# Patient Record
Sex: Female | Born: 1935 | Race: White | Hispanic: No | Smoking: Never smoker
Health system: Southern US, Community
[De-identification: ages and names within clinical notes are randomized; demographics above are authoritative.]

## PROBLEM LIST (undated history)

## (undated) DIAGNOSIS — I10 Essential (primary) hypertension: Secondary | ICD-10-CM

## (undated) DIAGNOSIS — R55 Syncope and collapse: Secondary | ICD-10-CM

## (undated) DIAGNOSIS — K440 Diaphragmatic hernia with obstruction, without gangrene: Secondary | ICD-10-CM

## (undated) DIAGNOSIS — F419 Anxiety disorder, unspecified: Secondary | ICD-10-CM

## (undated) DIAGNOSIS — M797 Fibromyalgia: Secondary | ICD-10-CM

## (undated) DIAGNOSIS — R42 Dizziness and giddiness: Secondary | ICD-10-CM

## (undated) DIAGNOSIS — M1990 Unspecified osteoarthritis, unspecified site: Secondary | ICD-10-CM

## (undated) DIAGNOSIS — H8109 Meniere's disease, unspecified ear: Secondary | ICD-10-CM

## (undated) DIAGNOSIS — J690 Pneumonitis due to inhalation of food and vomit: Secondary | ICD-10-CM

## (undated) DIAGNOSIS — K219 Gastro-esophageal reflux disease without esophagitis: Secondary | ICD-10-CM

## (undated) DIAGNOSIS — G4700 Insomnia, unspecified: Secondary | ICD-10-CM

## (undated) HISTORY — PX: APPENDECTOMY: SHX54

## (undated) HISTORY — PX: ABDOMINAL HYSTERECTOMY: SHX81

---

## 1979-08-29 HISTORY — PX: BILATERAL TOTAL MASTECTOMY WITH AXILLARY LYMPH NODE DISSECTION: SHX6364

## 1987-08-29 DIAGNOSIS — H8109 Meniere's disease, unspecified ear: Secondary | ICD-10-CM

## 1987-08-29 HISTORY — DX: Meniere's disease, unspecified ear: H81.09

## 2000-12-28 ENCOUNTER — Encounter: Payer: Self-pay | Admitting: Emergency Medicine

## 2000-12-28 ENCOUNTER — Emergency Department (HOSPITAL_COMMUNITY): Admission: EM | Admit: 2000-12-28 | Discharge: 2000-12-28 | Payer: Self-pay | Admitting: Emergency Medicine

## 2001-06-30 ENCOUNTER — Encounter: Payer: Self-pay | Admitting: Emergency Medicine

## 2001-06-30 ENCOUNTER — Emergency Department (HOSPITAL_COMMUNITY): Admission: EM | Admit: 2001-06-30 | Discharge: 2001-06-30 | Payer: Self-pay | Admitting: Emergency Medicine

## 2003-12-09 ENCOUNTER — Encounter: Admission: RE | Admit: 2003-12-09 | Discharge: 2003-12-09 | Payer: Self-pay | Admitting: Internal Medicine

## 2004-01-07 ENCOUNTER — Ambulatory Visit (HOSPITAL_COMMUNITY): Admission: RE | Admit: 2004-01-07 | Discharge: 2004-01-07 | Payer: Self-pay | Admitting: Internal Medicine

## 2004-12-09 ENCOUNTER — Encounter: Admission: RE | Admit: 2004-12-09 | Discharge: 2004-12-09 | Payer: Self-pay | Admitting: Internal Medicine

## 2005-11-16 IMAGING — US US ABDOMEN COMPLETE
1 series · 13 of 25 positions shown · non-contrast
Comparison: none

CLINICAL DATA: Abdominal pain.  
 ULTRASOUND OF THE ABDOMEN COMPLETE
 Scans over the upper abdomen were performed.  The gallbladder is well seen and no gallstones are noted.  The gallbladder wall is normal in thickness.  The common bile duct is normal in caliber measuring 3.6 mm.  The liver has a normal echogenic pattern.  The cysts questioned on a CT from [HOSPITAL] from 7887 are not well-visualized, but are very near the hemidiaphragm upon review of that prior study and do appear benign.  IVC appears normal.  Evaluation of the pancreas is limited by bowel gas.  The spleen is normal in size.  No hydronephrosis is seen.  The right kidney measures 10.8 cm sagittally with the left kidney measuring 9.0 cm. There is a cystic structure in the right mid medial kidney of 7 x 9 x 9 mm with an echogenic focus.  In reviewing the CT from [HOSPITAL] dated 12/28/00, this probably represents a complex cyst within the right mid kidney and does not appear to have changed significantly in size.  If further evaluation is felt indicated clinically, then follow-up by ultrasound in six months may be warranted.  The abdominal aorta is normal in caliber. 
 IMPRESSION
 1.  No gallstones. 
 2.  Evaluation of the pancreas is limited by bowel gas. 
 3.  Complex cyst in the right mid medial kidney.  Consider follow-up ultrasound in six months to assess stability.

[Series 1: unknown · 0.27mm/px · 13 of 81 slices shown]
[im 1/81]
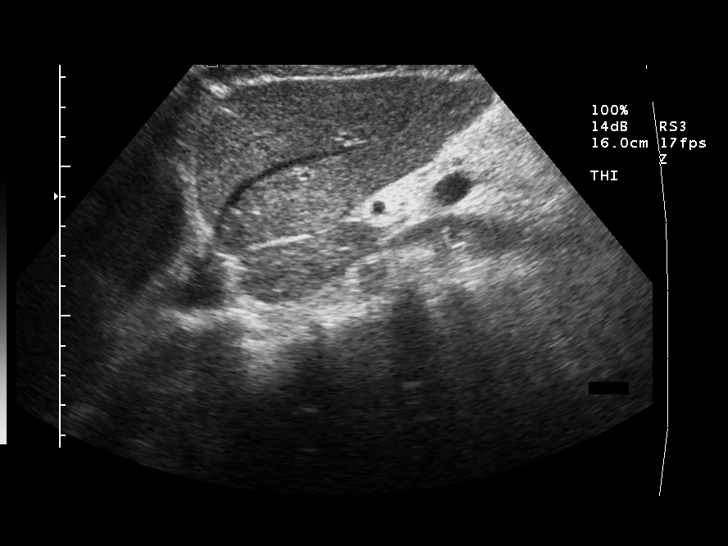
[im 7/81]
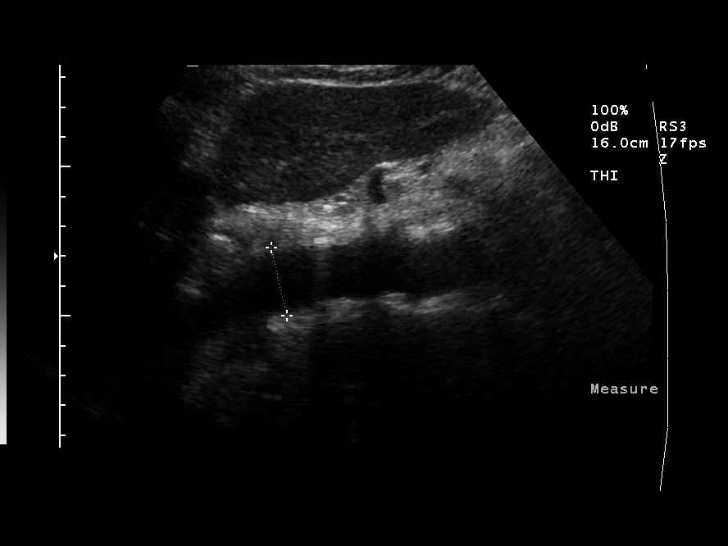
[im 14/81]
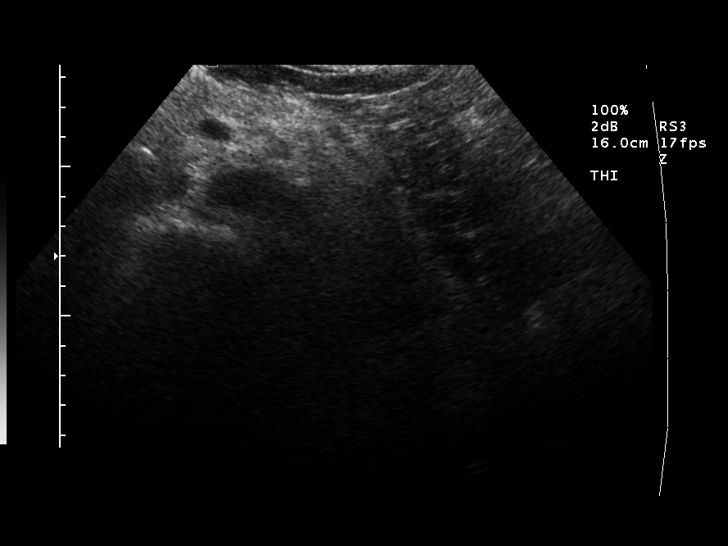
[im 21/81]
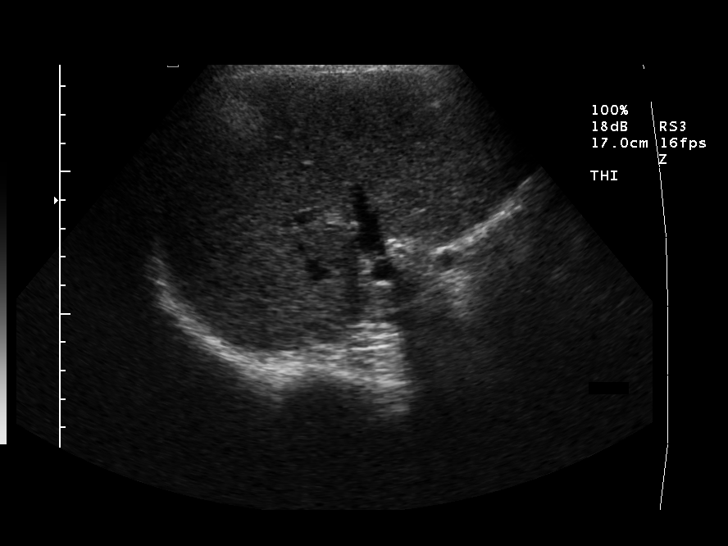
[im 27/81]
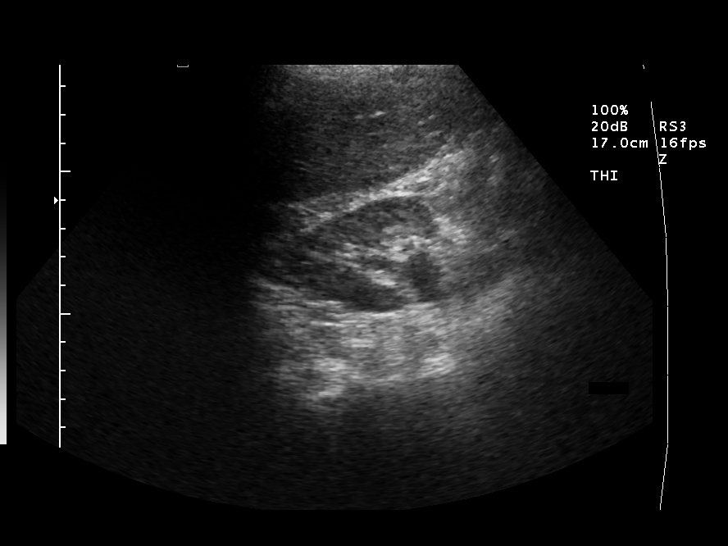
[im 34/81]
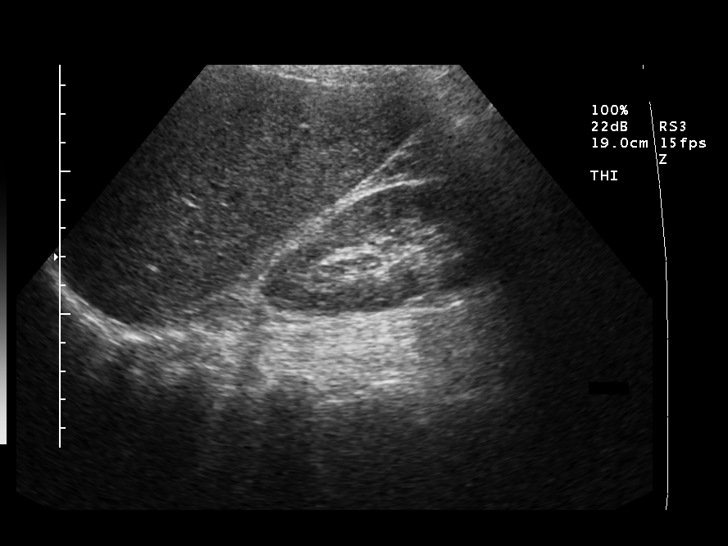
[im 41/81]
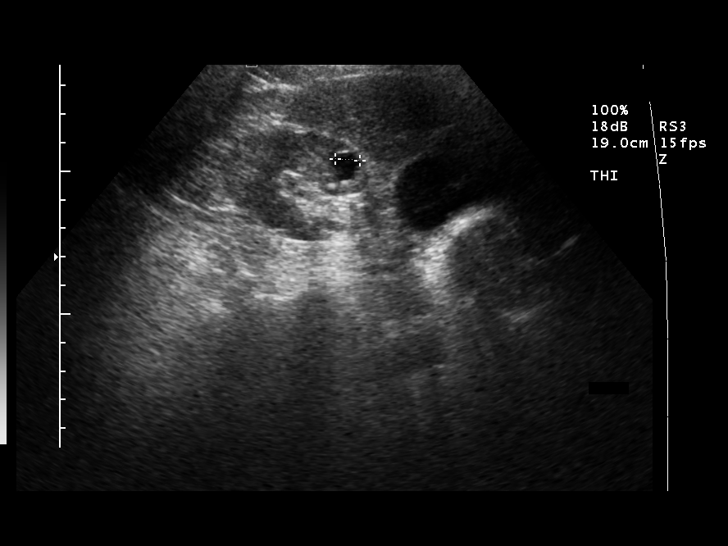
[im 47/81]
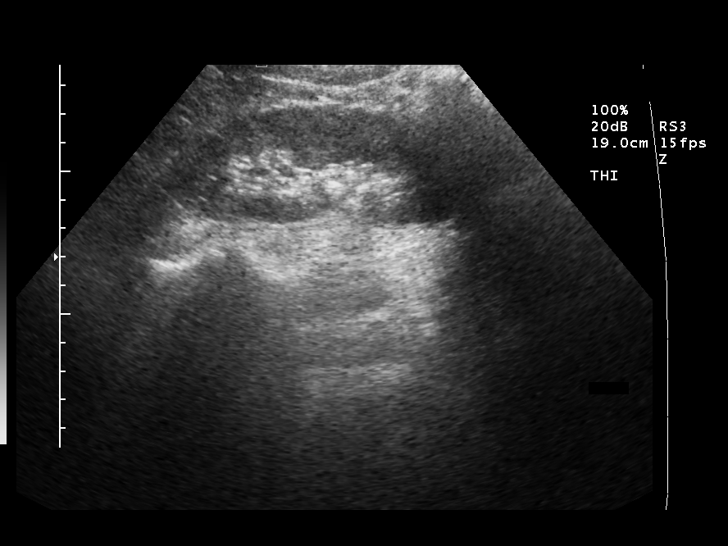
[im 54/81]
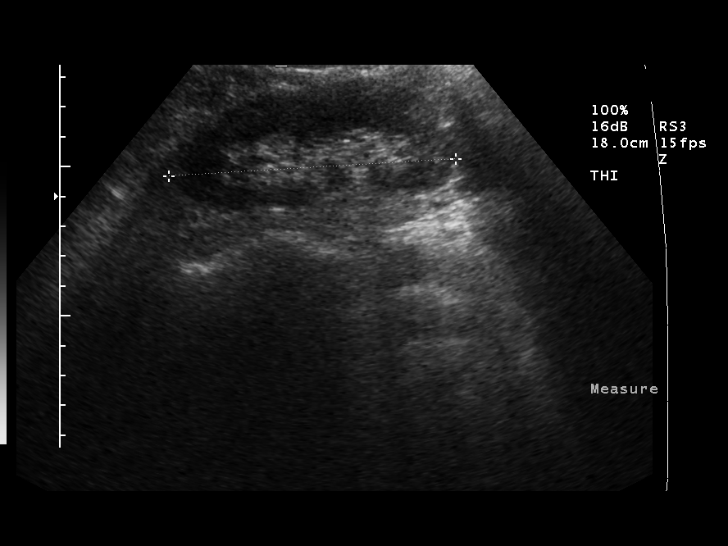
[im 61/81]
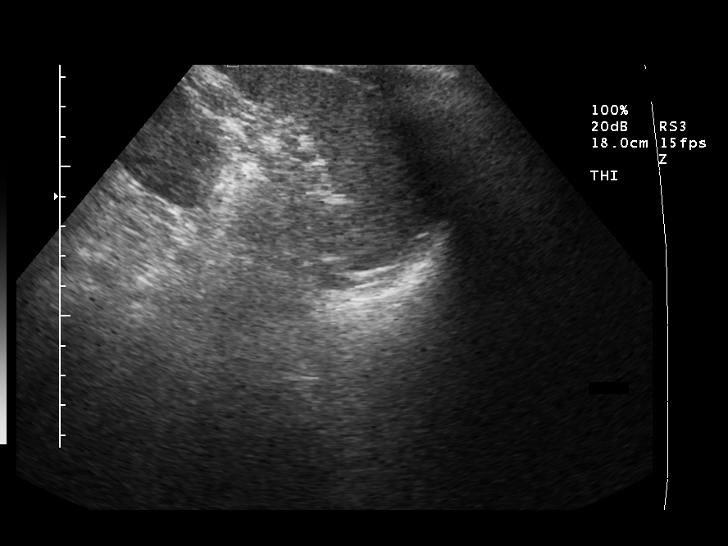
[im 67/81]
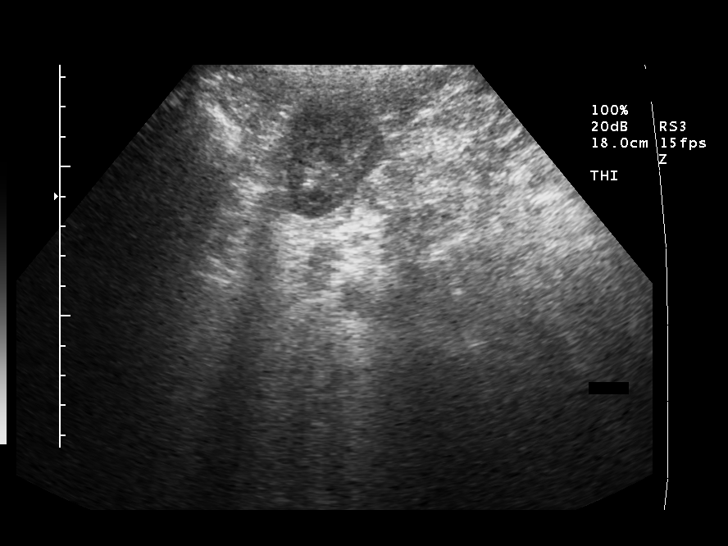
[im 74/81]
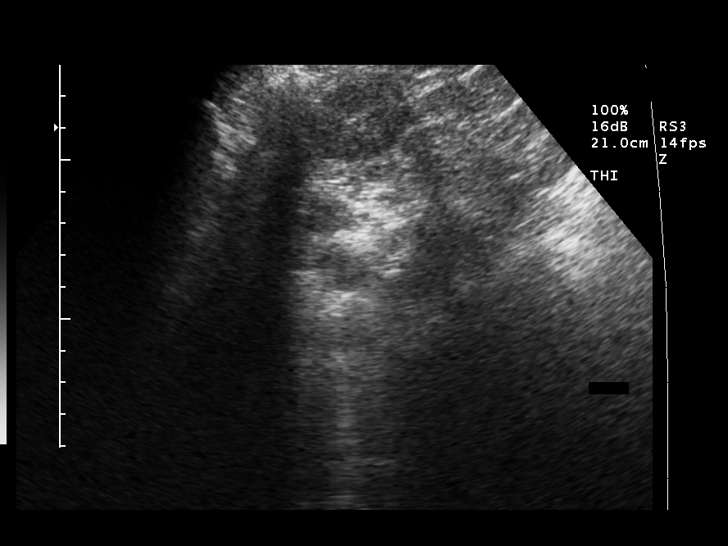
[im 81/81]
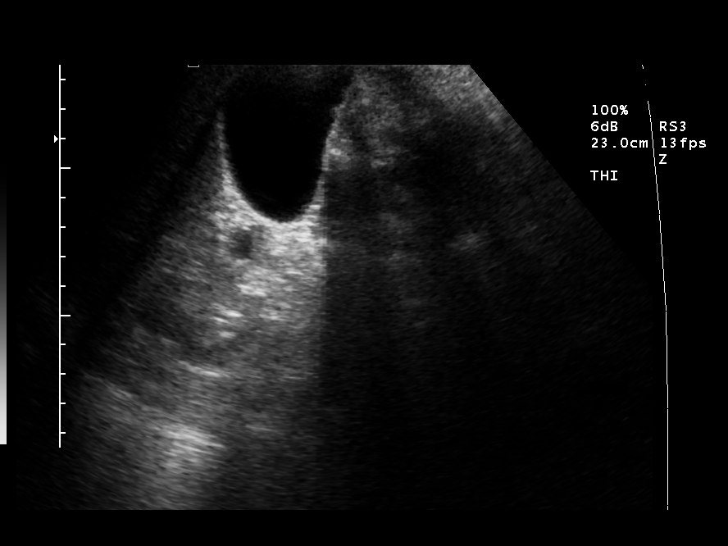

[13 of 25 positions shown; findings below may reference images not displayed]

## 2010-09-18 ENCOUNTER — Encounter: Payer: Self-pay | Admitting: Specialist

## 2010-09-18 ENCOUNTER — Encounter: Payer: Self-pay | Admitting: Internal Medicine

## 2010-09-19 ENCOUNTER — Encounter: Payer: Self-pay | Admitting: Specialist

## 2011-07-18 ENCOUNTER — Other Ambulatory Visit: Payer: Self-pay

## 2011-07-18 ENCOUNTER — Emergency Department (HOSPITAL_COMMUNITY): Payer: Medicare Other

## 2011-07-18 ENCOUNTER — Emergency Department (HOSPITAL_COMMUNITY)
Admission: EM | Admit: 2011-07-18 | Discharge: 2011-07-19 | Disposition: A | Discharge status: Home / Self Care | Payer: Medicare Other | Attending: Emergency Medicine | Admitting: Emergency Medicine

## 2011-07-18 ENCOUNTER — Encounter: Payer: Self-pay | Admitting: Emergency Medicine

## 2011-07-18 DIAGNOSIS — T394X2A Poisoning by antirheumatics, not elsewhere classified, intentional self-harm, initial encounter: Secondary | ICD-10-CM | POA: Insufficient documentation

## 2011-07-18 DIAGNOSIS — T4271XA Poisoning by unspecified antiepileptic and sedative-hypnotic drugs, accidental (unintentional), initial encounter: Secondary | ICD-10-CM | POA: Insufficient documentation

## 2011-07-18 DIAGNOSIS — T40601A Poisoning by unspecified narcotics, accidental (unintentional), initial encounter: Secondary | ICD-10-CM | POA: Insufficient documentation

## 2011-07-18 DIAGNOSIS — T4272XA Poisoning by unspecified antiepileptic and sedative-hypnotic drugs, intentional self-harm, initial encounter: Secondary | ICD-10-CM | POA: Insufficient documentation

## 2011-07-18 DIAGNOSIS — T1491XA Suicide attempt, initial encounter: Secondary | ICD-10-CM

## 2011-07-18 DIAGNOSIS — T426X1A Poisoning by other antiepileptic and sedative-hypnotic drugs, accidental (unintentional), initial encounter: Secondary | ICD-10-CM | POA: Insufficient documentation

## 2011-07-18 DIAGNOSIS — R52 Pain, unspecified: Secondary | ICD-10-CM | POA: Insufficient documentation

## 2011-07-18 DIAGNOSIS — F341 Dysthymic disorder: Secondary | ICD-10-CM | POA: Insufficient documentation

## 2011-07-18 DIAGNOSIS — T398X1A Poisoning by other nonopioid analgesics and antipyretics, not elsewhere classified, accidental (unintentional), initial encounter: Secondary | ICD-10-CM | POA: Insufficient documentation

## 2011-07-18 DIAGNOSIS — M810 Age-related osteoporosis without current pathological fracture: Secondary | ICD-10-CM

## 2011-07-18 DIAGNOSIS — T426X2A Poisoning by other antiepileptic and sedative-hypnotic drugs, intentional self-harm, initial encounter: Secondary | ICD-10-CM | POA: Insufficient documentation

## 2011-07-18 DIAGNOSIS — M129 Arthropathy, unspecified: Secondary | ICD-10-CM | POA: Insufficient documentation

## 2011-07-18 DIAGNOSIS — G8929 Other chronic pain: Secondary | ICD-10-CM | POA: Insufficient documentation

## 2011-07-18 DIAGNOSIS — H8109 Meniere's disease, unspecified ear: Secondary | ICD-10-CM

## 2011-07-18 DIAGNOSIS — Z7982 Long term (current) use of aspirin: Secondary | ICD-10-CM | POA: Insufficient documentation

## 2011-07-18 DIAGNOSIS — Z79899 Other long term (current) drug therapy: Secondary | ICD-10-CM | POA: Insufficient documentation

## 2011-07-18 DIAGNOSIS — T50901A Poisoning by unspecified drugs, medicaments and biological substances, accidental (unintentional), initial encounter: Secondary | ICD-10-CM

## 2011-07-18 HISTORY — DX: Unspecified osteoarthritis, unspecified site: M19.90

## 2011-07-18 HISTORY — DX: Fibromyalgia: M79.7

## 2011-07-18 HISTORY — DX: Insomnia, unspecified: G47.00

## 2011-07-18 LAB — ACETAMINOPHEN LEVEL: Acetaminophen (Tylenol), Serum: 36.2 ug/mL — ABNORMAL HIGH (ref 10–30)

## 2011-07-18 LAB — HEPATIC FUNCTION PANEL
AST: 11 U/L (ref 0–37)
Albumin: 3.5 g/dL (ref 3.5–5.2)
Bilirubin, Direct: <0.1 mg/dL (ref 0.0–0.3)

## 2011-07-18 LAB — URINALYSIS, ROUTINE W REFLEX MICROSCOPIC
Bilirubin Urine: NEGATIVE
Ketones, ur: NEGATIVE mg/dL
Protein, ur: NEGATIVE mg/dL
Urobilinogen, UA: 0.2 mg/dL (ref 0.0–1.0)

## 2011-07-18 LAB — POCT I-STAT, CHEM 8
Creatinine, Ser: 0.9 mg/dL (ref 0.50–1.10)
Hemoglobin: 14.3 g/dL (ref 12.0–15.0)
Potassium: 4 mEq/L (ref 3.5–5.1)
Sodium: 144 mEq/L (ref 135–145)

## 2011-07-18 LAB — CBC
Hemoglobin: 13.6 g/dL (ref 12.0–15.0)
MCH: 30.8 pg (ref 26.0–34.0)
MCHC: 32.8 g/dL (ref 30.0–36.0)
Platelets: 265 10*3/uL (ref 150–400)
RDW: 15.2 % (ref 11.5–15.5)

## 2011-07-18 LAB — SALICYLATE LEVEL: Salicylate Lvl: <2 mg/dL — ABNORMAL LOW (ref 2.8–20.0)

## 2011-07-18 LAB — URINE MICROSCOPIC-ADD ON

## 2011-07-18 LAB — GLUCOSE, CAPILLARY: Glucose-Capillary: 118 mg/dL — ABNORMAL HIGH (ref 70–99)

## 2011-07-18 LAB — RAPID URINE DRUG SCREEN, HOSP PERFORMED
Amphetamines: NOT DETECTED
Benzodiazepines: NOT DETECTED
Cocaine: NOT DETECTED
Tetrahydrocannabinol: NOT DETECTED

## 2011-07-18 MED ORDER — NALOXONE HCL 1 MG/ML IJ SOLN
2.0000 mg | Freq: Once | INTRAMUSCULAR | Status: AC
  Administered 2011-07-18: 2 mg via INTRAVENOUS
  Filled 2011-07-18: qty 2

## 2011-07-18 MED ORDER — NALOXONE HCL 0.4 MG/ML IJ SOLN
2.0000 ug/kg/h | INTRAVENOUS | Status: DC
  Filled 2011-07-18: qty 2.5

## 2011-07-18 MED ORDER — SODIUM CHLORIDE 0.9 % IV SOLN
Freq: Once | INTRAVENOUS | Status: AC
  Administered 2011-07-18 – 2011-07-19 (×2): via INTRAVENOUS

## 2011-07-18 MED ORDER — SODIUM CHLORIDE 0.9 % IJ SOLN: 3.0000 mL | INTRAMUSCULAR | Status: DC

## 2011-07-18 MED ORDER — NALOXONE HCL 1 MG/ML IJ SOLN
INTRAMUSCULAR | Status: AC
  Filled 2011-07-18: qty 2

## 2011-07-18 NOTE — ED Notes (Signed)
Patient having [place] jerky motions awake but not alert.  Unable to answer orientation items x 3 .  NS 1000 cc HTF

## 2011-07-18 NOTE — ED Notes (Signed)
Changed into scrubs called security to wand the patient.  Dr Nicanor Alcon in to see patient and family

## 2011-07-18 NOTE — ED Notes (Signed)
WUJ:[id number]<JY> Expected date:07/18/11<BR> Expected time: 6:59 PM<BR> Means of arrival:Ambulance<BR> Comments:<BR> EMS 70 GC - Intentional OD - stable

## 2011-07-18 NOTE — ED Notes (Signed)
Patient may have taken as much as Ambien 220 mg but most likely less than 50 mg and tramadol 50 mg one bottle empty and 02 not accounted for according to

## 2011-07-18 NOTE — H&P (Signed)
PCP:   No primary provider on file.   Chief Complaint:  Chief complaint: Overdose/suicide attempt  HPI: [name] is a patient well-known to me all in the office with main issues being chronic depression and anxiety, chronic musculoskeletal pain, Mnire's disease and osteoarthritis who presents by EMS with a medication overdose in a suicide attempt. She has been chronically frail and depressed but never suicidal. Our multiple interactions in the office have been such that she has repeatedly refused any psychiatric help and antidepressants. She has remained fairly well compensated on her current meds however. Evidently, after a verbal altercation with one of her daughters she went upstairs and took a handful of pills including Ambien, narcotic,  ULTR a.m. and probably some Tylenol. She had altered mental status and was quite lethargic unable to support her weight. EMS was summoned and she was brought to the emergency room for further management. In the emergency room she was arty on a Narcan drip to keep her awake and manage her airway. She has had a sinus tach and a toxicology screen does artery return positive for opiates, benzodiazepines and Tylenol. She is noted for medical stabilization and psychiatric consultation.  Review of Systems:  The patient denies , fever, weight loss,, vision loss, decreased hearing, hoarseness, chest pain, syncope, dyspnea on exertion, peripheral edema, , hemoptysis, abdominal pain, melena, hematochezia, severe indigestion/heartburn, hematuria, incontinence, genital sores, muscle weakness, suspicious skin lesions, transient blindness, difficulty walking, depression, unusual weight change, abnormal bleeding, enlarged lymph nodes, angioedema, and breast masses. Patient does report chronically anorexia but has had a stable weight. She does have chronic musculoskeletal ailments and a sense of vertigo and imbalance chronically.  Past Medical History: Exact details of her  past history are unclear his office records are unavailable and she is unable to give a concise history. As stated above she does have a history of chronic depression, Mnire's disease, osteoporosis and osteoarthritis Medications: Prior to Admission medications   Medication Sig Start Date End Date Taking? Authorizing Provider  aspirin-acetaminophen-caffeine (EXCEDRIN EXTRA STRENGTH) [id number] MG per tablet Take 1 tablet by mouth every 6 (six) hours as needed. Used for pain    Yes Historical Provider, MD  methocarbamol (ROBAXIN) 500 MG tablet Take 500 mg by mouth 3 (three) times daily.     Yes Historical Provider, MD  ranitidine (ZANTAC) 150 MG tablet Take 150 mg by mouth 2 (two) times daily.     Yes Historical Provider, MD  traMADol (ULTRAM) 50 MG tablet Take 50 mg by mouth every 6 (six) hours as needed. Maximum dose= 8 tablets per day as needed for pain    Yes Historical Provider, MD  Vitamin D, Ergocalciferol, (DRISDOL) 50000 UNITS CAPS Take 50,000 Units by mouth. Takes one cap on Mon, Wed, and Fri for 2 months, then 1 cap a week    Yes Historical Provider, MD  zolpidem (AMBIEN) 10 MG tablet Take 10 mg by mouth at bedtime as needed.     Yes Historical Provider, MD  diphenhydrAMINE (BENADRYL) 25 MG tablet Take 25 mg by mouth every 6 (six) hours as needed. For allergy     Historical Provider, MD    Allergies:   Allergies  Allergen Reactions  . Shellfish Allergy     unknown    Social History: Patient is single does reside with her daughter. She does have a smoking history. Family History: No family history on file.  Physical Exam: Filed Vitals:   07/18/11 1953 07/18/11 2156  BP: 163/85  Pulse: 92   Temp: 98 F (36.7 C)   TempSrc: Oral   Resp: 14   Weight:  54.432 kg (120 lb)  SpO2: 100%    General appearance: delirious, distracted and mild distress, she is frail and cachectic appearing. Head: Normocephalic, without obvious abnormality, atraumatic Eyes: conjunctivae/corneas  clear. PERRL, EOM's intact.  Nose: Nares normal. Septum midline. Mucosa normal. No drainage or sinus tenderness. Throat: lips, mucosa, and tongue normal; teeth and gums normal Neck: no adenopathy, no carotid bruit, no JVD and thyroid not enlarged, symmetric, no tenderness/mass/nodules Resp: clear to auscultation bilaterally Cardio: regular rate and rhythm, S1, S2 normal, no murmur, click, rub or gallop GI: soft, non-tender; bowel sounds normal; no masses,  no organomegaly Extremities: extremities normal, atraumatic, no cyanosis or edema Pulses: 2+ and symmetric Lymph nodes: Cervical adenopathy: no cervical lymphadenopathy Neurologic: Alert and oriented X 3, normal strength and tone. Normal symmetric reflexes.     Labs on Admission:   [facility] 07/18/11 2020  NA 144  K 4.0  CL 108  CO2 --  GLUCOSE 109*  BUN 13  CREATININE 0.90  CALCIUM --  MG --  PHOS --    Basename 07/18/11 2010  AST 11  ALT 7  ALKPHOS 67  BILITOT 0.2*  PROT 6.6  ALBUMIN 3.5   No results found for this basename: LIPASE:2,AMYLASE:2 in the last 72 hours  Basename 07/18/11 2020 07/18/11 2010  WBC -- 8.6  NEUTROABS -- --  HGB 14.3 13.6  HCT 42.0 41.5  MCV -- 93.9  PLT -- 265   No results found for this basename: CKTOTAL:3,CKMB:3,CKMBINDEX:3,TROPONINI:3 in the last 72 hours No results found for this basename: TSH,T4TOTAL,FREET3,T3FREE,THYROIDAB in the last 72 hours No results found for this basename: VITAMINB12:2,FOLATE:2,FERRITIN:2,TIBC:2,IRON:2,RETICCTPCT:2 in the last 72 hours  Radiological Exams on Admission: Ct Head Wo Contrast  07/18/2011  *RADIOLOGY REPORT*  Clinical Data: Altered mental status, drug overdose.  CT HEAD WITHOUT CONTRAST  Technique:  Contiguous axial images were obtained from the base of the skull through the vertex without contrast.  Comparison: None.  Findings: Images are degraded by patient motion however felt to be of diagnostic quality. Advanced periventricular and  subcortical white matter hypodensities. There is no evidence for acute hemorrhage, hydrocephalus, mass lesion, or abnormal extra-axial fluid collection.  No definite CT evidence for acute infarction. The visualized paranasal sinuses and mastoid air cells are predominately clear.  IMPRESSION: Advanced white matter hypodensities, nonspecific, may represent chronic microangiopathic change.  Otherwise, no acute intracranial abnormality.  Original Report Authenticated By: Waneta Martins, M.D.   No orders found for this or any previous visit.  Assessment/Plan Principal Problem:  *Overdose drug Active Problems:  Chronic depression  Osteoporosis  Meniere's disease  Patient will be admitted hydrated moderate in the ICU particularly given her airway and the Narcan drip. Psychiatry was consulted for possible geropsychiatric placement.  Shiryl Ruddy A 07/18/2011, 10:54 PM

## 2011-07-18 NOTE — ED Provider Notes (Signed)
History     CSN: [id number] Arrival date & time: 07/18/2011  7:46 PM   First MD Initiated Contact with Patient 07/18/11 1949      Chief Complaint  Patient presents with  . Ingestion  . Drug Overdose    (Consider location/radiation/quality/duration/timing/severity/associated sxs/prior treatment) Patient is a 77 y.o. female presenting with Ingested Medication and Overdose. The history is provided by the EMS personnel and a relative. The history is limited by the condition of the patient. No language interpreter was used.  Ingestion This is a new problem. The current episode started 1 to 2 hours ago. The problem occurs constantly. The problem has not changed since onset.Pertinent negatives include no chest pain, no abdominal pain, no headaches and no shortness of breath. The symptoms are aggravated by nothing. Relieved by: narcan. Treatments tried: narcan by ems. The treatment provided significant (but temporary) relief.  Drug Overdose Pertinent negatives include no chest pain, no abdominal pain, no headaches and no shortness of breath.  Apparently got into a fight with her daughter and reportedly took 3 to 6 10 mg of ambien and some tramadol.  Later we found out she had access to vicodin and morphine as well  No past medical history on file.  No past surgical history on file.  No family history on file.  History  Substance Use Topics  . Smoking status: Not on file  . Smokeless tobacco: Not on file  . Alcohol Use: Not on file    OB History    Grav Para Term Preterm Abortions TAB SAB Ect Mult Living                  Review of Systems  Unable to perform ROS Respiratory: Negative for shortness of breath.   Cardiovascular: Negative for chest pain.  Gastrointestinal: Negative for abdominal pain.  Neurological: Negative for headaches.    Allergies  Shellfish allergy  Home Medications   Current Outpatient Rx  Name Route Sig Dispense Refill  .  ASPIRIN-ACETAMINOPHEN-CAFFEINE 250-250-65 MG PO TABS Oral Take 1 tablet by mouth every 6 (six) hours as needed. Used for pain     . METHOCARBAMOL 500 MG PO TABS Oral Take 500 mg by mouth 3 (three) times daily.      Marland Kitchen RANITIDINE HCL 150 MG PO TABS Oral Take 150 mg by mouth 2 (two) times daily.      . TRAMADOL HCL 50 MG PO TABS Oral Take 50 mg by mouth every 6 (six) hours as needed. Maximum dose= 8 tablets per day as needed for pain     . VITAMIN D (ERGOCALCIFEROL) 50000 UNITS PO CAPS Oral Take 50,000 Units by mouth. Takes one cap on Mon, Wed, and Fri for 2 months, then 1 cap a week     . ZOLPIDEM TARTRATE 10 MG PO TABS Oral Take 10 mg by mouth at bedtime as needed.      Marland Kitchen DIPHENHYDRAMINE HCL 25 MG PO TABS Oral Take 25 mg by mouth every 6 (six) hours as needed. For allergy       BP 163/85  Pulse 92  Temp(Src) 98 F (36.7 C) (Oral)  Resp 14  Wt 120 lb (54.432 kg)  SpO2 100%  Physical Exam  Constitutional: She appears well-developed and well-nourished.  HENT:  Head: Normocephalic and atraumatic.       No hemotympanum  Eyes: Conjunctivae are normal. Pupils are equal, round, and reactive to light. Right eye exhibits no discharge. Left eye exhibits no discharge.  Neck: Normal range of motion. Neck supple.  Cardiovascular: Normal rate and regular rhythm.  Exam reveals no friction rub.   Pulmonary/Chest: Effort normal and breath sounds normal. No respiratory distress.  Abdominal: Soft. Bowel sounds are normal. There is no tenderness. There is no rebound and no guarding.  Musculoskeletal: Normal range of motion. She exhibits no edema.  Neurological: She is alert.  Skin: Skin is warm and dry.    ED Course  Procedures (including critical care time)  Labs Reviewed  ACETAMINOPHEN LEVEL - Abnormal; Notable for the following:    Acetaminophen (Tylenol), Serum 36.2 (*)    All other components within normal limits  SALICYLATE LEVEL - Abnormal; Notable for the following:    Salicylate Lvl <2.0  (*)    All other components within normal limits  URINE RAPID DRUG SCREEN (HOSP PERFORMED) - Abnormal; Notable for the following:    Opiates POSITIVE (*)    All other components within normal limits  URINALYSIS, ROUTINE W REFLEX MICROSCOPIC - Abnormal; Notable for the following:    Hgb urine dipstick TRACE (*)    All other components within normal limits  HEPATIC FUNCTION PANEL - Abnormal; Notable for the following:    Total Bilirubin 0.2 (*)    All other components within normal limits  POCT I-STAT, CHEM 8 - Abnormal; Notable for the following:    Glucose, Bld 109 (*)    Calcium, Ion 1.11 (*)    All other components within normal limits  GLUCOSE, CAPILLARY - Abnormal; Notable for the following:    Glucose-Capillary 118 (*)    All other components within normal limits  CBC  ETHANOL  URINE MICROSCOPIC-ADD ON  POCT CBG MONITORING  I-STAT, CHEM 8  URINE CULTURE   No results found.   No diagnosis found.  Results for orders placed during the hospital encounter of 07/18/11  CBC      Component Value Range   WBC 8.6  4.0 - 10.5 (K/uL)   RBC 4.42  3.87 - 5.11 (MIL/uL)   Hemoglobin 13.6  12.0 - 15.0 (g/dL)   HCT 45.4  09.8 - 11.9 (%)   MCV 93.9  78.0 - 100.0 (fL)   MCH 30.8  26.0 - 34.0 (pg)   MCHC 32.8  30.0 - 36.0 (g/dL)   RDW 14.7  82.9 - 56.2 (%)   Platelets 265  150 - 400 (K/uL)  ACETAMINOPHEN LEVEL      Component Value Range   Acetaminophen (Tylenol), Serum 36.2 (*) 10 - 30 (ug/mL)  SALICYLATE LEVEL      Component Value Range   Salicylate Lvl <2.0 (*) 2.8 - 20.0 (mg/dL)  URINE RAPID DRUG SCREEN (HOSP PERFORMED)      Component Value Range   Opiates POSITIVE (*) NONE DETECTED    Cocaine NONE DETECTED  NONE DETECTED    Benzodiazepines NONE DETECTED  NONE DETECTED    Amphetamines NONE DETECTED  NONE DETECTED    Tetrahydrocannabinol NONE DETECTED  NONE DETECTED    Barbiturates NONE DETECTED  NONE DETECTED   URINALYSIS, ROUTINE W REFLEX MICROSCOPIC      Component Value  Range   Color, Urine YELLOW  YELLOW    Appearance CLEAR  CLEAR    Specific Gravity, Urine 1.007  1.005 - 1.030    pH 6.5  5.0 - 8.0    Glucose, UA NEGATIVE  NEGATIVE (mg/dL)   Hgb urine dipstick TRACE (*) NEGATIVE    Bilirubin Urine NEGATIVE  NEGATIVE    Ketones, ur  NEGATIVE  NEGATIVE (mg/dL)   Protein, ur NEGATIVE  NEGATIVE (mg/dL)   Urobilinogen, UA 0.2  0.0 - 1.0 (mg/dL)   Nitrite NEGATIVE  NEGATIVE    Leukocytes, UA NEGATIVE  NEGATIVE   HEPATIC FUNCTION PANEL      Component Value Range   Total Protein 6.6  6.0 - 8.3 (g/dL)   Albumin 3.5  3.5 - 5.2 (g/dL)   AST 11  0 - 37 (U/L)   ALT 7  0 - 35 (U/L)   Alkaline Phosphatase 67  39 - 117 (U/L)   Total Bilirubin 0.2 (*) 0.3 - 1.2 (mg/dL)   Bilirubin, Direct <8.2  0.0 - 0.3 (mg/dL)   Indirect Bilirubin NOT CALCULATED  0.3 - 0.9 (mg/dL)  ETHANOL      Component Value Range   Alcohol, Ethyl (B) <11  0 - 11 (mg/dL)  POCT I-STAT, CHEM 8      Component Value Range   Sodium 144  135 - 145 (mEq/L)   Potassium 4.0  3.5 - 5.1 (mEq/L)   Chloride 108  96 - 112 (mEq/L)   BUN 13  6 - 23 (mg/dL)   Creatinine, Ser 9.56  0.50 - 1.10 (mg/dL)   Glucose, Bld 213 (*) 70 - 99 (mg/dL)   Calcium, Ion 0.86 (*) 1.12 - 1.32 (mmol/L)   TCO2 25  0 - 100 (mmol/L)   Hemoglobin 14.3  12.0 - 15.0 (g/dL)   HCT 57.8  46.9 - 62.9 (%)  URINE MICROSCOPIC-ADD ON      Component Value Range   RBC / HPF 0-2  <3 (RBC/hpf)   Bacteria, UA RARE  RARE   GLUCOSE, CAPILLARY      Component Value Range   Glucose-Capillary 118 (*) 70 - 99 (mg/dL)   Comment 1 Notify RN     Comment 2 Documented in Chart     No results found.  Results for orders placed during the hospital encounter of 07/18/11  CBC      Component Value Range   WBC 8.6  4.0 - 10.5 (K/uL)   RBC 4.42  3.87 - 5.11 (MIL/uL)   Hemoglobin 13.6  12.0 - 15.0 (g/dL)   HCT 52.8  41.3 - 24.4 (%)   MCV 93.9  78.0 - 100.0 (fL)   MCH 30.8  26.0 - 34.0 (pg)   MCHC 32.8  30.0 - 36.0 (g/dL)   RDW 01.0  27.2 - 53.6  (%)   Platelets 265  150 - 400 (K/uL)  ACETAMINOPHEN LEVEL      Component Value Range   Acetaminophen (Tylenol), Serum 36.2 (*) 10 - 30 (ug/mL)  SALICYLATE LEVEL      Component Value Range   Salicylate Lvl <2.0 (*) 2.8 - 20.0 (mg/dL)  URINE RAPID DRUG SCREEN (HOSP PERFORMED)      Component Value Range   Opiates POSITIVE (*) NONE DETECTED    Cocaine NONE DETECTED  NONE DETECTED    Benzodiazepines NONE DETECTED  NONE DETECTED    Amphetamines NONE DETECTED  NONE DETECTED    Tetrahydrocannabinol NONE DETECTED  NONE DETECTED    Barbiturates NONE DETECTED  NONE DETECTED   URINALYSIS, ROUTINE W REFLEX MICROSCOPIC      Component Value Range   Color, Urine YELLOW  YELLOW    Appearance CLEAR  CLEAR    Specific Gravity, Urine 1.007  1.005 - 1.030    pH 6.5  5.0 - 8.0    Glucose, UA NEGATIVE  NEGATIVE (mg/dL)   Hgb urine  dipstick TRACE (*) NEGATIVE    Bilirubin Urine NEGATIVE  NEGATIVE    Ketones, ur NEGATIVE  NEGATIVE (mg/dL)   Protein, ur NEGATIVE  NEGATIVE (mg/dL)   Urobilinogen, UA 0.2  0.0 - 1.0 (mg/dL)   Nitrite NEGATIVE  NEGATIVE    Leukocytes, UA NEGATIVE  NEGATIVE   HEPATIC FUNCTION PANEL      Component Value Range   Total Protein 6.6  6.0 - 8.3 (g/dL)   Albumin 3.5  3.5 - 5.2 (g/dL)   AST 11  0 - 37 (U/L)   ALT 7  0 - 35 (U/L)   Alkaline Phosphatase 67  39 - 117 (U/L)   Total Bilirubin 0.2 (*) 0.3 - 1.2 (mg/dL)   Bilirubin, Direct <2.1  0.0 - 0.3 (mg/dL)   Indirect Bilirubin NOT CALCULATED  0.3 - 0.9 (mg/dL)  ETHANOL      Component Value Range   Alcohol, Ethyl (B) <11  0 - 11 (mg/dL)  POCT I-STAT, CHEM 8      Component Value Range   Sodium 144  135 - 145 (mEq/L)   Potassium 4.0  3.5 - 5.1 (mEq/L)   Chloride 108  96 - 112 (mEq/L)   BUN 13  6 - 23 (mg/dL)   Creatinine, Ser 3.08  0.50 - 1.10 (mg/dL)   Glucose, Bld 657 (*) 70 - 99 (mg/dL)   Calcium, Ion 8.46 (*) 1.12 - 1.32 (mmol/L)   TCO2 25  0 - 100 (mmol/L)   Hemoglobin 14.3  12.0 - 15.0 (g/dL)   HCT 96.2  95.2 -  84.1 (%)  URINE MICROSCOPIC-ADD ON      Component Value Range   RBC / HPF 0-2  <3 (RBC/hpf)   Bacteria, UA RARE  RARE   GLUCOSE, CAPILLARY      Component Value Range   Glucose-Capillary 118 (*) 70 - 99 (mg/dL)   Comment 1 Notify RN     Comment 2 Documented in Chart     No results found.  MDM  MDM Reviewed: nursing note and vitals Interpretation: ECG, CT scan and labs Total time providing critical care: 30-74 minutes. This excludes time spent performing separately reportable procedures and services. Consults: primary care provider     Date: 07/18/2011  Rate:91  Rhythm: normal sinus rhythm  QRS Axis: normal  Intervals: normal  ST/T Wave abnormalities: normal  Conduction Disutrbances:none  Narrative Interpretation:   Old EKG Reviewed: unchanged CRITICAL CARE Performed by: Jasmine Awe   Total critical care time: 60 minutes  Critical care time was exclusive of separately billable procedures and treating other patients.  Critical care was necessary to treat or prevent imminent or life-threatening deterioration.  Critical care was time spent personally by me on the following activities: development of treatment plan with patient and/or surrogate as well as nursing, discussions with consultants, evaluation of patient's response to treatment, examination of patient, obtaining history from patient or surrogate, ordering and performing treatments and interventions, ordering and review of laboratory studies, ordering and review of radiographic studies, pulse oximetry and re-evaluation of patient's condition.  Dr. Jacky Kindle informed of need to check tylenol and salicylate level in 4 hours and if > 150 to start acetodote.  Will need to be seen by psychiatry when medically clear.     Jasmine Awe, MD 07/18/11 2219

## 2011-07-18 NOTE — ED Notes (Signed)
Per ems called to residence . Patient and daughterr had verbal altercation. Pt went up stair and supposedly was found unresponsive after that.  Unknown meds that were injested.  Narcan !mg IV with some mild response.  But patient not alert at all.  Given @ 1840.  Int 20 LAC. Patient o/a responds  to verbal stimuli.  Family at bedside

## 2011-07-18 NOTE — ED Notes (Signed)
Questioned family as to what patient could have take that contained tylenol because her tylenol level was elevated and the meds that we had not gotten the opiate level.  Patient daughter also sts that the patient took ? Vicodin x 3 tabs but unknown to daughter that that patient even had Vicodin

## 2011-07-19 ENCOUNTER — Encounter (HOSPITAL_COMMUNITY): Payer: Self-pay

## 2011-07-19 LAB — URINE CULTURE: Culture: NO GROWTH

## 2011-07-19 MED ORDER — ONDANSETRON 8 MG PO TBDP
8.0000 mg | ORAL_TABLET | Freq: Once | ORAL | Status: AC
  Administered 2011-07-19: 8 mg via ORAL
  Filled 2011-07-19: qty 1

## 2011-07-19 MED ORDER — FENTANYL CITRATE 0.05 MG/ML IJ SOLN
25.0000 ug | Freq: Once | INTRAMUSCULAR | Status: AC
  Administered 2011-07-19: 25 ug via INTRAVENOUS
  Filled 2011-07-19: qty 2

## 2011-07-19 MED ORDER — HYDROCODONE-ACETAMINOPHEN 5-325 MG PO TABS
2.0000 | ORAL_TABLET | Freq: Once | ORAL | Status: AC
  Administered 2011-07-19: 1 via ORAL
  Filled 2011-07-19: qty 2

## 2011-07-19 NOTE — Discharge Instructions (Signed)
Overdose, Adult A person can overdose on alcohol, drugs or both by accident or on purpose. If it was on purpose, it is a serious matter. Professional help should be sought. If the overdose was an accident, certain steps should be taken to make sure that it never happens again. ACCIDENTAL OVERDOSE Overdosing on prescription medications can be a result of:  Not understanding the instructions.   Misreading the label.   Forgetting that you took a dose and then taking another by mistake. This situation happens a lot.  To make sure this does not happen again:  Clarify the correct dosage with your caregiver.   Place the correct dosage in a "pill-minder" container (labeled for each day and time of day).   Have someone dispense your medicine.  Please be sure to follow-up with your primary care doctor as directed. INTENTIONAL OVERDOSE If the overdose was on purpose, it is a serious situation. Taking more than the prescribed amount of medications (including taking someone else's prescription), abusing street drugs or drinking an amount of alcohol that requires medical treatment can show a variety of possible problems. These may indicate you:  Are depressed or suicidal.   Are abusing drugs, took too much or combined different drugs to experiment with the effects.   Mixed alcohol with drugs and did not realize the danger of doing so (this is drug abuse).   Are suffering addiction to drugs and/or alcohol (also known as chemical dependency).   Binge drink.  If you have not been referred to a mental health professional for help, it is important that you get help right away. Only a professional can determine which problems may exist and what the best course of treatment may be. It is your responsibility to follow-up with further evaluation or treatment as directed.  Alcohol is responsible for a large number of overdoses and unintended deaths among college-age young adults. Binge drinking is consuming  4-5 drinks in a [place] period of time. The amount of alcohol in standard servings of wine (5 oz.), beer (12 oz.) and distilled spirits (1.5 oz., 80 proof) is the same. Beer or wine can be just as dangerous to the binge drinker as "hard" liquor can be.  CONSEQUENCES OF BINGE DRINKING Alcohol poisoning is the most serious consequence of binge drinking. This is a severe and potentially fatal physical reaction to an alcohol overdose. When too much alcohol is consumed, the brain does not get enough oxygen. The lack of oxygen will eventually cause the brain to shut down the voluntary functions that regulate breathing and heart rate. Symptoms of alcohol poisoning include:  Vomiting.   Passing out (unconsciousness).   Cold, clammy, pale or bluish skin.   Slow or irregular breathing.  WHAT SHOULD I DO NEXT? If you have a history of drug abuse or suffer chemical dependency (alcoholism, drug addiction or both), you might consider the following:  Talk with a qualified substance abuse counselor and consider entering a treatment program.   Go to a detox facility if necessary.   If you were attending self-help group meetings, consider returning to them and go often.   Explore other resources located near you (see sources listed below).  If you are unsure if you have a substance abuse problem, ask yourself the following questions:  Have you been told by friends or family that drugs/alcohol has become a problem?   Do you get into fights when drinking or using drugs?   Do you have blackouts (not remembering what you  do while using)?   Do you lie about use or amounts of drugs or alcohol you consume?   Do you need chemicals to get you going?   Do you suffer in work or school performance because of drug or alcohol use?   Do you get sick from drug or alcohol use but continue to use anyway?   Do you need drugs or alcohol to relate to people or feel comfortable in social situations?   Do you use drugs  or alcohol to forget problems?  If you answered "Yes" to any of the above questions, it means you show signs of chemical dependency and a professional evaluation is suggested. The longer the use of drugs and alcohol continues, the problems will become greater. SEEK IMMEDIATE MEDICAL CARE IF:   You feel like you might repeat your problematic behavior.   You need someone to talk to and feel that it should not wait.   You feel you are a danger to yourself or someone else.   You feel like you are having a new reaction to medications you are taking, or you are getting worse after leaving a care center.   You have an overwhelming urge to drink or use drugs.  Addiction cannot be cured, but it can be treated successfully. Treatment centers are listed in the yellow pages under: Cocaine, Narcotics, and Alcoholics Anonymous. Most hospitals and clinics can refer you to a specialized care center. The [place] government maintains a toll-free number for obtaining treatment referrals: (786) 733-9186 or 6013490173 (TDD) and maintains a website: http://findtreatment.RockToxic.pl. Other websites for additional information are: www.mentalhealth.RockToxic.pl. and GreatestFeeling.tn. In [place] treatment resources are listed in each [place] with listings available under [employer] for [employer] or similar titles. Document Released: 08/17/2003 Document Revised: 04/26/2011 Document Reviewed: 07/08/2008 [facility] Patient Information 2012 [place], [place].  You have signs of possible anxiety and/or depression. This is a very common problem.  Be sure to call your caregiver and arrange for follow-up care as suggested by our staff. RETURN IMMEDIATELY IF DEVELOP threat to harm self or others, suicidal or homicidal thoughts, hallucinations or confusion, unable to be cared for at home or uncontrolled behavior, or other concerns.

## 2011-07-19 NOTE — ED Notes (Signed)
Pt received two generic vicodin. Scanner only scanned 1 tablet. Note sent to pharmacy of the same.

## 2011-07-19 NOTE — ED Notes (Signed)
Patient given a sandwich, fruit and drink .  Family remains at bedside.  Patient now fully awake orientated x 2-3 a little fuzzy on the day of the week

## 2011-07-19 NOTE — ED Notes (Signed)
Patient dozing at intervals but continues to talk with family member off and on.  Still waiting for room assignment

## 2011-07-19 NOTE — ED Notes (Signed)
B/P 158/83  HR 72  R 20--Awake and alert---smiling--conversing with family members at bedside

## 2011-07-19 NOTE — Progress Notes (Signed)
I'm seeing Ms. Cowans today still in the emergency room waiting for a bed. She is actually bright alert interactive conversing quite appropriately. She does have a recollection of taking pills she relates because she heard inside. I quickly reminded her about the confrontation with her daughter. She clearly denies any suicidal ideation at this time and admits a significant mistake. I did reinforce to her that she's had some chronic depressive symptoms and I been unsuccessful in getting her any psychiatric care or willingness to try certain antidepressants. She does acknowledge this. At this time she is having no chest pain or shortness of breath. She denies any nausea or vomiting. She is mentally quite clear and again denies any suicidal thoughts or ideation.  Examination  BP 182/84  Pulse 74  Temp(Src) 98.7 F (37.1 C) (Oral)  Resp 11  Wt 54.432 kg (120 lb)  SpO2 96% Patient is awake alert conversant no distress. She does have some bitemporal wasting and is cachectic. Good facial symmetry. Anicteric. Lungs are clear to auscultation percussion. Cardiovascular exam regular rate and rhythm with no JVD. Abdomen is soft and nontender bowel sounds normal. Extremities without edema and intact distal pulses. Neurologically higher cortical functioning is intact and she is nonlateralizing. She is awake alert appropriate and understands the ramifications of her actions.  Assessment #1 medication overdose/possible suicide attempt stemming from a confrontation with her daughter currently medically stable and disposition remains the big issue. She does not appear suicidal to me but have asked that the act team get involved and make a decision whether she needs geropsychiatric inpatient care or can followup as an outpatient.  #2 chronic depression/chronic pain/fibromyalgia-all these issues remained stable  #3 Mnire's disease stable  Plan await psychiatric evaluation for disposition.

## 2011-07-19 NOTE — ED Notes (Signed)
ACT counselor has been in to assess pt--please refer to her notes and she does not feel patient is a threat to herself or others and has voices her remorse to the counselor.

## 2011-07-19 NOTE — ED Notes (Signed)
Patient more awake talking with family . Not happy that she is not going to be able to go home tonight and that she needs to be evaluated by psych.

## 2011-07-19 NOTE — ED Notes (Signed)
Patient now fully reacted and Orientated x 3 received room assignment will call report

## 2011-07-19 NOTE — ED Notes (Signed)
C/o headache--rates the pain a 10 on 1-10 scale  B/P 186/83--Dr. Fonnie Jarvis notified

## 2011-07-19 NOTE — ED Notes (Signed)
Patient dozing at intervals. Will call report to floor

## 2011-07-19 NOTE — ED Notes (Signed)
Admitting md in to see patient -Narcan gtt d/c'd .  Wants patient to be seen by ACT.  Attempted to call act but no answer

## 2011-07-19 NOTE — ED Notes (Signed)
Report received from P.M. Shift---Atthis time, pt is alert and oriented---Eating from breakfast tray--daughter at bedside

## 2011-07-19 NOTE — BH Assessment (Signed)
Assessment Note   Nicole Key is an 77 y.o. female. Pt presented via EMS following an overdose of Ambien, Tramadol, Tylenol and possibly some other medications following a verbal altercation with her daughter prior to OD. Pt stated "I was tired, I had a rough day. My daughter and I exchanged words. I just wanted to get out. It's not easy living at my age." Pt also stated "It was just one of those moments and I didn't stop to think." Pt is alert and awake. Pt has good memory recall and able to clearly communicate her incorrect actions (not backing down from her daughter) and that they each said things they should not have said to each other. Pt denies current SI, HI or psychosis. Does not report any memory or recall issues that are inappropriate with her age. Pt does report some feelings of sadness over her age and not being able to get out and about as she once was able. States she has been eating well, no changes to her sleeping patterns with approximately 5-6 hours nightly. Pt admitted to one prior incident many years ago when she had overmedicated herself to aleve the ringing in her ears and the doctor told her she needed "a rest" and she had IPT at [facility]. Pt reports no other history of IPT or OPT for mental health issues. Discussed her support structure, which are her children (son & daughter), friends, her minister and his wife. Did discuss benefits of OPT, but pt was not interested in that citing she had good people to talk to about her things. Pt was calm, pleasant and cooperative during assessment. Discussed with EDP (Dr. Fonnie Jarvis) who was agreeable with disposition to release pt from psych stand point.  Axis I: Depressive Disorder NOS Axis II: Deferred Axis III:  Past Medical History  Diagnosis Date  . Insomnia   . Fibromyalgia   . Arthritis    Axis IV: General medical, conflict with daughter Axis V: 41-50 serious symptoms  Past Medical History:  Past Medical History  Diagnosis Date    . Insomnia   . Fibromyalgia   . Arthritis     Past Surgical History  Procedure Date  . Appendectomy   . Abdominal hysterectomy     Family History: History reviewed. No pertinent family history.  Social History:  reports that she has never smoked. She has never used smokeless tobacco. She reports that she drinks alcohol. She reports that she does not use illicit drugs.  Allergies:  Allergies  Allergen Reactions  . Shellfish Allergy     unknown    Home Medications:  Medications Prior to Admission  Medication Dose Route Frequency Provider Last Rate Last Dose  . 0.9 %  sodium chloride infusion   Intravenous Once April K Palumbo-Rasch, MD 500 mL/hr at 07/19/11 1050    . fentaNYL (SUBLIMAZE) injection 25 mcg  25 mcg Intravenous Once Hurman Horn, MD   25 mcg at 07/19/11 1439  . naloxone [facility]) 1 MG/ML injection           . naloxone [facility]) injection 2 mg  2 mg Intravenous Once April K Palumbo-Rasch, MD   2 mg at 07/18/11 2008  . DISCONTD: naloxone (NARCAN) 1 mg in dextrose 5 % 250 mL infusion  2 mcg/kg/hr Intravenous Continuous April K Palumbo-Rasch, MD      . DISCONTD: sodium chloride 0.9 % injection 3 mL  3 mL Intravenous STAT April Smitty Cords, MD       No  current outpatient prescriptions on file as of 07/18/2011.    OB/GYN Status:  No LMP recorded. Patient has had a hysterectomy.  General Assessment Data Assessment Number: 1  Living Arrangements: Family members (Daughter & her family) Can pt return to current living arrangement?: Yes Admission Status: Voluntary Is patient capable of signing voluntary admission?: Yes Transfer from: Home Referral Source: Self/Family/Friend  Risk to self Suicidal Ideation: Yes-Currently Present Suicidal Intent: No Is patient at risk for suicide?: No Suicidal Plan?: No-Not Currently/Within Last 6 Months (Had fleeting thought at the time last night, but remorseful ) Access to Means: Yes (Rx, OTC, etc) Specify Access to Suicidal  Means: OTC, Rx, Etc What has been your use of drugs/alcohol within the last 12 months?: None Other Self Harm Risks: n/a Triggers for Past Attempts: Other (Comment) (Depression/Medical Issues) Intentional Self Injurious Behavior: None Factors that decrease suicide risk: Religious beliefs Family Suicide History: No Recent stressful life event(s): Conflict (Comment) (verbal exchange with daughter 07/18/11 prior to OD) Persecutory voices/beliefs?: No Depression: Yes Depression Symptoms: Insomnia (Age appropriate loss/grief) Substance abuse history and/or treatment for substance abuse?: No Suicide prevention information given to non-admitted patients: Yes  Risk to Others Homicidal Ideation: No Thoughts of Harm to Others: No Current Homicidal Intent: No Current Homicidal Plan: No Access to Homicidal Means: No Identified Victim: n/a History of harm to others?: No Assessment of Violence: None Noted Violent Behavior Description: n/a Does patient have access to weapons?: No Criminal Charges Pending?: No Does patient have a court date: No  Mental Status Report Appear/Hygiene: Disheveled Eye Contact: Good Motor Activity: Unsteady Speech: Soft;Other (Comment) (Pt reported bad headache, so speech was low & soft) Level of Consciousness: Alert Mood:  (Remorseful) Affect: Appropriate to circumstance Anxiety Level: Minimal Thought Processes: Coherent;Relevant Judgement: Unimpaired Orientation: Person;Place;Time;Situation Obsessive Compulsive Thoughts/Behaviors: None  Cognitive Functioning Concentration: Normal Memory: Recent Intact;Remote Intact IQ: Average Insight: Good Impulse Control: Fair Appetite: Good Weight Loss: 0  Weight Gain: 0  Sleep: No Change Total Hours of Sleep: 6  Vegetative Symptoms: None  Prior Inpatient/Outpatient Therapy Prior Therapy: Inpatient Prior Therapy Dates: Years ago Prior Therapy Facilty/Provider(s): [facility] Reason for Treatment: overmedicating  with Rx  ADL Screening (condition at time of admission) Patient's cognitive ability adequate to safely complete daily activities?: Yes Patient able to express need for assistance with ADLs?: Yes Independently performs ADLs?: Yes Weakness of Legs: None Weakness of Arms/Hands: None  Home Assistive Devices/Equipment Home Assistive Devices/Equipment: None  Therapy Consults (therapy consults require a physician order) PT Evaluation Needed: No OT Evalulation Needed: No SLP Evaluation Needed: No Abuse/Neglect Assessment (Assessment to be complete while patient is alone) Physical Abuse: Denies Verbal Abuse: Denies Sexual Abuse: Denies Exploitation of patient/patient's resources: Denies Self-Neglect: Denies Values / Beliefs Cultural Requests During Hospitalization: None Spiritual Requests During Hospitalization: None Consults Spiritual Care Consult Needed: No Social Work Consult Needed: No Merchant navy officer (For Healthcare) Advance Directive: Patient does not have advance directive Pre-existing out of facility DNR order (yellow form or pink MOST form): No Nutrition Screen Diet: Regular;Other (Comment) (no shellfish) Unintentional weight loss greater than 10lbs within the last month: No Dysphagia: No Home Tube Feeding or Total Parenteral Nutrition (TPN): No Patient appears severely malnourished: No Pregnant or Lactating: No Dietitian Consult Needed: No  Additional Information 1:1 In Past 12 Months?: No CIRT Risk: No Elopement Risk: No Does patient have medical clearance?: Yes     Disposition:  Disposition Disposition of Patient: Treatment offered and refused (Pt not interested in OPT referrals -  discussed her supports) Type of treatment offered and refused: Out-patient  On Site Evaluation by:   Reviewed with Physician:  Dr. Genene Churn D 07/19/2011 4:04 PM

## 2011-07-19 NOTE — ED Notes (Signed)
Patient still awake . Alert and orientated x 3 . Voided qs onbed pan

## 2011-08-08 ENCOUNTER — Other Ambulatory Visit: Payer: Self-pay | Admitting: Internal Medicine

## 2011-08-08 DIAGNOSIS — R634 Abnormal weight loss: Secondary | ICD-10-CM

## 2011-08-10 ENCOUNTER — Other Ambulatory Visit: Payer: Medicare Other

## 2011-08-14 ENCOUNTER — Ambulatory Visit
Admission: RE | Admit: 2011-08-14 | Discharge: 2011-08-14 | Disposition: A | Discharge status: Home / Self Care | Payer: Medicare Other | Source: Ambulatory Visit | Attending: Internal Medicine | Admitting: Internal Medicine

## 2011-08-14 DIAGNOSIS — R634 Abnormal weight loss: Secondary | ICD-10-CM

## 2011-08-14 MED ORDER — IOHEXOL 300 MG/ML  SOLN
100.0000 mL | Freq: Once | INTRAMUSCULAR | Status: AC | PRN
  Administered 2011-08-14: 100 mL via INTRAVENOUS

## 2013-08-14 DIAGNOSIS — S0003XA Contusion of scalp, initial encounter: Secondary | ICD-10-CM | POA: Diagnosis present

## 2013-08-14 DIAGNOSIS — W19XXXA Unspecified fall, initial encounter: Secondary | ICD-10-CM | POA: Diagnosis present

## 2013-08-14 DIAGNOSIS — T07XXXA Unspecified multiple injuries, initial encounter: Secondary | ICD-10-CM | POA: Diagnosis present

## 2013-08-14 DIAGNOSIS — R55 Syncope and collapse: Secondary | ICD-10-CM | POA: Diagnosis not present

## 2013-08-14 DIAGNOSIS — M549 Dorsalgia, unspecified: Secondary | ICD-10-CM | POA: Diagnosis present

## 2013-08-14 DIAGNOSIS — F329 Major depressive disorder, single episode, unspecified: Secondary | ICD-10-CM | POA: Diagnosis present

## 2013-08-14 DIAGNOSIS — IMO0001 Reserved for inherently not codable concepts without codable children: Secondary | ICD-10-CM | POA: Diagnosis present

## 2013-08-14 DIAGNOSIS — R269 Unspecified abnormalities of gait and mobility: Secondary | ICD-10-CM | POA: Diagnosis present

## 2013-08-14 DIAGNOSIS — R0789 Other chest pain: Secondary | ICD-10-CM | POA: Diagnosis present

## 2013-08-14 DIAGNOSIS — Z801 Family history of malignant neoplasm of trachea, bronchus and lung: Secondary | ICD-10-CM

## 2013-08-14 DIAGNOSIS — IMO0002 Reserved for concepts with insufficient information to code with codable children: Secondary | ICD-10-CM | POA: Diagnosis present

## 2013-08-14 DIAGNOSIS — I10 Essential (primary) hypertension: Secondary | ICD-10-CM | POA: Diagnosis present

## 2013-08-14 DIAGNOSIS — D72829 Elevated white blood cell count, unspecified: Secondary | ICD-10-CM | POA: Diagnosis present

## 2013-08-14 DIAGNOSIS — R64 Cachexia: Secondary | ICD-10-CM | POA: Diagnosis present

## 2013-08-14 DIAGNOSIS — Z833 Family history of diabetes mellitus: Secondary | ICD-10-CM

## 2013-08-14 DIAGNOSIS — Z7982 Long term (current) use of aspirin: Secondary | ICD-10-CM

## 2013-08-14 DIAGNOSIS — S022XXA Fracture of nasal bones, initial encounter for closed fracture: Secondary | ICD-10-CM | POA: Diagnosis present

## 2013-08-14 DIAGNOSIS — M129 Arthropathy, unspecified: Secondary | ICD-10-CM | POA: Diagnosis present

## 2013-08-14 DIAGNOSIS — M542 Cervicalgia: Secondary | ICD-10-CM | POA: Diagnosis present

## 2013-08-14 DIAGNOSIS — R51 Headache: Secondary | ICD-10-CM | POA: Diagnosis present

## 2013-08-14 DIAGNOSIS — K219 Gastro-esophageal reflux disease without esophagitis: Secondary | ICD-10-CM | POA: Diagnosis present

## 2013-08-14 DIAGNOSIS — M810 Age-related osteoporosis without current pathological fracture: Secondary | ICD-10-CM | POA: Diagnosis present

## 2013-08-14 DIAGNOSIS — G8929 Other chronic pain: Secondary | ICD-10-CM | POA: Diagnosis present

## 2013-08-14 DIAGNOSIS — E785 Hyperlipidemia, unspecified: Secondary | ICD-10-CM | POA: Diagnosis present

## 2013-08-14 DIAGNOSIS — Z9181 History of falling: Secondary | ICD-10-CM

## 2013-08-14 DIAGNOSIS — F3289 Other specified depressive episodes: Secondary | ICD-10-CM | POA: Diagnosis present

## 2013-08-14 DIAGNOSIS — Z8249 Family history of ischemic heart disease and other diseases of the circulatory system: Secondary | ICD-10-CM

## 2013-08-14 DIAGNOSIS — R05 Cough: Secondary | ICD-10-CM | POA: Diagnosis present

## 2013-08-14 DIAGNOSIS — R059 Cough, unspecified: Secondary | ICD-10-CM | POA: Diagnosis present

## 2013-08-14 DIAGNOSIS — Z9010 Acquired absence of unspecified breast and nipple: Secondary | ICD-10-CM

## 2013-08-14 DIAGNOSIS — Y92009 Unspecified place in unspecified non-institutional (private) residence as the place of occurrence of the external cause: Secondary | ICD-10-CM

## 2013-08-14 DIAGNOSIS — Z681 Body mass index (BMI) 19 or less, adult: Secondary | ICD-10-CM

## 2013-08-14 DIAGNOSIS — I739 Peripheral vascular disease, unspecified: Secondary | ICD-10-CM | POA: Diagnosis present

## 2013-08-15 ENCOUNTER — Inpatient Hospital Stay (HOSPITAL_COMMUNITY): Payer: Medicare Other

## 2013-08-15 ENCOUNTER — Inpatient Hospital Stay (HOSPITAL_COMMUNITY)
Admission: EM | Admit: 2013-08-15 | Discharge: 2013-08-19 | DRG: 312 | Disposition: A | Discharge status: Home / Self Care | Payer: Medicare Other | Attending: Internal Medicine | Admitting: Internal Medicine

## 2013-08-15 ENCOUNTER — Emergency Department (HOSPITAL_COMMUNITY): Payer: Medicare Other

## 2013-08-15 ENCOUNTER — Encounter (HOSPITAL_COMMUNITY): Payer: Self-pay | Admitting: Emergency Medicine

## 2013-08-15 DIAGNOSIS — I10 Essential (primary) hypertension: Secondary | ICD-10-CM

## 2013-08-15 DIAGNOSIS — R64 Cachexia: Secondary | ICD-10-CM | POA: Diagnosis present

## 2013-08-15 DIAGNOSIS — M549 Dorsalgia, unspecified: Secondary | ICD-10-CM | POA: Diagnosis present

## 2013-08-15 DIAGNOSIS — S0003XA Contusion of scalp, initial encounter: Secondary | ICD-10-CM | POA: Diagnosis present

## 2013-08-15 DIAGNOSIS — S022XXA Fracture of nasal bones, initial encounter for closed fracture: Secondary | ICD-10-CM

## 2013-08-15 DIAGNOSIS — R51 Headache: Secondary | ICD-10-CM | POA: Diagnosis present

## 2013-08-15 DIAGNOSIS — R55 Syncope and collapse: Secondary | ICD-10-CM | POA: Diagnosis present

## 2013-08-15 DIAGNOSIS — IMO0002 Reserved for concepts with insufficient information to code with codable children: Secondary | ICD-10-CM | POA: Diagnosis present

## 2013-08-15 DIAGNOSIS — R0789 Other chest pain: Secondary | ICD-10-CM | POA: Diagnosis present

## 2013-08-15 DIAGNOSIS — F329 Major depressive disorder, single episode, unspecified: Secondary | ICD-10-CM | POA: Diagnosis present

## 2013-08-15 DIAGNOSIS — R269 Unspecified abnormalities of gait and mobility: Secondary | ICD-10-CM | POA: Diagnosis present

## 2013-08-15 DIAGNOSIS — IMO0001 Reserved for inherently not codable concepts without codable children: Secondary | ICD-10-CM | POA: Diagnosis present

## 2013-08-15 DIAGNOSIS — M129 Arthropathy, unspecified: Secondary | ICD-10-CM | POA: Diagnosis present

## 2013-08-15 DIAGNOSIS — F32A Depression, unspecified: Secondary | ICD-10-CM | POA: Diagnosis present

## 2013-08-15 DIAGNOSIS — I359 Nonrheumatic aortic valve disorder, unspecified: Secondary | ICD-10-CM

## 2013-08-15 DIAGNOSIS — M810 Age-related osteoporosis without current pathological fracture: Secondary | ICD-10-CM | POA: Diagnosis present

## 2013-08-15 DIAGNOSIS — R05 Cough: Secondary | ICD-10-CM | POA: Diagnosis present

## 2013-08-15 DIAGNOSIS — S0083XA Contusion of other part of head, initial encounter: Secondary | ICD-10-CM

## 2013-08-15 DIAGNOSIS — E785 Hyperlipidemia, unspecified: Secondary | ICD-10-CM | POA: Diagnosis present

## 2013-08-15 DIAGNOSIS — T07XXXA Unspecified multiple injuries, initial encounter: Secondary | ICD-10-CM | POA: Diagnosis present

## 2013-08-15 DIAGNOSIS — R2681 Unsteadiness on feet: Secondary | ICD-10-CM | POA: Diagnosis present

## 2013-08-15 DIAGNOSIS — D72829 Elevated white blood cell count, unspecified: Secondary | ICD-10-CM

## 2013-08-15 DIAGNOSIS — M542 Cervicalgia: Secondary | ICD-10-CM | POA: Diagnosis present

## 2013-08-15 HISTORY — DX: Syncope and collapse: R55

## 2013-08-15 HISTORY — DX: Meniere's disease, unspecified ear: H81.09

## 2013-08-15 LAB — CBC WITH DIFFERENTIAL/PLATELET
Eosinophils Absolute: 0.3 10*3/uL (ref 0.0–0.7)
Eosinophils Relative: 2 % (ref 0–5)
Hemoglobin: 15.2 g/dL — ABNORMAL HIGH (ref 12.0–15.0)
Lymphs Abs: 2.1 10*3/uL (ref 0.7–4.0)
MCH: 31.3 pg (ref 26.0–34.0)
MCV: 91.1 fL (ref 78.0–100.0)
Monocytes Absolute: 0.9 10*3/uL (ref 0.1–1.0)
Monocytes Relative: 6 % (ref 3–12)
RBC: 4.85 MIL/uL (ref 3.87–5.11)

## 2013-08-15 LAB — BASIC METABOLIC PANEL
CO2: 24 mEq/L (ref 19–32)
Calcium: 10 mg/dL (ref 8.4–10.5)
Glucose, Bld: 122 mg/dL — ABNORMAL HIGH (ref 70–99)
Potassium: 3.6 mEq/L (ref 3.5–5.1)
Sodium: 138 mEq/L (ref 135–145)

## 2013-08-15 LAB — URINALYSIS, ROUTINE W REFLEX MICROSCOPIC
Glucose, UA: NEGATIVE mg/dL
Specific Gravity, Urine: 1.011 (ref 1.005–1.030)

## 2013-08-15 LAB — SALICYLATE LEVEL: Salicylate Lvl: 6 mg/dL (ref 2.8–20.0)

## 2013-08-15 LAB — URINE MICROSCOPIC-ADD ON

## 2013-08-15 LAB — ACETAMINOPHEN LEVEL: Acetaminophen (Tylenol), Serum: <15 ug/mL (ref 10–30)

## 2013-08-15 MED ORDER — ALPRAZOLAM 0.25 MG PO TABS
0.2500 mg | ORAL_TABLET | Freq: Two times a day (BID) | ORAL | Status: DC | PRN
  Administered 2013-08-17 – 2013-08-18 (×4): 0.25 mg via ORAL
  Filled 2013-08-15 (×5): qty 1

## 2013-08-15 MED ORDER — ONDANSETRON HCL 4 MG/2ML IJ SOLN: 4.0000 mg | Freq: Three times a day (TID) | INTRAMUSCULAR | Status: DC | PRN

## 2013-08-15 MED ORDER — ACETAMINOPHEN 325 MG PO TABS
650.0000 mg | ORAL_TABLET | Freq: Once | ORAL | Status: AC
  Administered 2013-08-15: 650 mg via ORAL
  Filled 2013-08-15: qty 2

## 2013-08-15 MED ORDER — NICARDIPINE HCL IN NACL 20-0.86 MG/200ML-% IV SOLN
5.0000 mg/h | Freq: Once | INTRAVENOUS | Status: DC
  Filled 2013-08-15: qty 200

## 2013-08-15 MED ORDER — ESCITALOPRAM OXALATE 10 MG PO TABS
10.0000 mg | ORAL_TABLET | Freq: Every day | ORAL | Status: DC
  Administered 2013-08-15 – 2013-08-19 (×5): 10 mg via ORAL
  Filled 2013-08-15 (×5): qty 1

## 2013-08-15 MED ORDER — HYDROCODONE-ACETAMINOPHEN 5-325 MG PO TABS
1.0000 | ORAL_TABLET | Freq: Three times a day (TID) | ORAL | Status: DC
  Administered 2013-08-15 – 2013-08-19 (×14): 1 via ORAL
  Filled 2013-08-15 (×14): qty 1

## 2013-08-15 MED ORDER — LABETALOL HCL 5 MG/ML IV SOLN
10.0000 mg | Freq: Once | INTRAVENOUS | Status: AC
  Administered 2013-08-15: 10 mg via INTRAVENOUS
  Filled 2013-08-15: qty 4

## 2013-08-15 MED ORDER — PANTOPRAZOLE SODIUM 40 MG PO TBEC
40.0000 mg | DELAYED_RELEASE_TABLET | Freq: Every day | ORAL | Status: DC
  Administered 2013-08-15 – 2013-08-19 (×5): 40 mg via ORAL
  Filled 2013-08-15 (×5): qty 1

## 2013-08-15 MED ORDER — METHOCARBAMOL 500 MG PO TABS
500.0000 mg | ORAL_TABLET | Freq: Three times a day (TID) | ORAL | Status: DC
  Administered 2013-08-15 – 2013-08-19 (×13): 500 mg via ORAL
  Filled 2013-08-15 (×19): qty 1

## 2013-08-15 MED ORDER — AMLODIPINE BESYLATE 5 MG PO TABS
5.0000 mg | ORAL_TABLET | Freq: Every day | ORAL | Status: DC
  Administered 2013-08-15 – 2013-08-19 (×5): 5 mg via ORAL
  Filled 2013-08-15 (×5): qty 1

## 2013-08-15 MED ORDER — SODIUM CHLORIDE 0.9 % IV SOLN
Freq: Once | INTRAVENOUS | Status: AC
  Administered 2013-08-15: via INTRAVENOUS

## 2013-08-15 MED ORDER — TRAMADOL HCL 50 MG PO TABS
50.0000 mg | ORAL_TABLET | Freq: Four times a day (QID) | ORAL | Status: DC | PRN
  Administered 2013-08-15 – 2013-08-19 (×10): 50 mg via ORAL
  Filled 2013-08-15 (×10): qty 1

## 2013-08-15 NOTE — Progress Notes (Signed)
  Echocardiogram 2D Echocardiogram has been performed.  Nestor Ramp M 08/15/2013, 3:20 PM

## 2013-08-15 NOTE — Progress Notes (Signed)
Nutrition Brief Note  Patient identified as Underweight BMI  Wt Readings from Last 15 Encounters:  08/15/13 117 lb (53.071 kg)  07/18/11 120 lb (54.432 kg)    Patient reported good appetite pta, eats approximately 2-3 meals/day. Denied any unintentional weight loss. Has gained 7 lbs from 03/2013-05/2013 by increasing kcal intake. Declined supplementation at this time  Body mass index is 18.32 kg/(m^2). Patient meets criteria for Underweight based on current BMI.   Current diet order is Cardiac, patient is consuming approximately 75-100% of meals at this time. Labs and medications reviewed.   No nutrition interventions warranted at this time. If nutrition issues arise, please consult RD.   Lloyd Huger MS RD LDN Clinical Dietitian Pager:[phone]

## 2013-08-15 NOTE — ED Notes (Signed)
Pt arrived via EMS with a complaint of a fall.  Pt has Mnire disease.  Pt had an episode and fell hitting her left eyebrow forehead area on a wooden chair.  A large hematoma is now present.  Pt also recently had a fall prior to this fall several weeks ago which has left an abrasion on her nose and a right black eye.  Pt states she was not seen for the first fall by a medical professional.  Pt's nose is swollen.  Pt presently is hypertensive and tachycardic.

## 2013-08-15 NOTE — ED Notes (Signed)
Bed: ZO10 Expected date:  Expected time:  Means of arrival:  Comments: EMS dizziness, ? syncope

## 2013-08-15 NOTE — H&P (Signed)
PCP:   Dr. Geoffry Paradise   Chief Complaint:  Fall  HPI: Is an 79 year old white female who presents after a fall at home. This is the third of 3 falls occurring over the last 3 months. This occurred when she got up from her bed to try to go to the bathroom. She had 2 failed attempts to get up off the bed and fell back onto the bed. No loss of consciousness noted. The third time she was able to get off the bed and travel several feet until the point she collapsed and hit her face on the chair. She denies loss of consciousness. She notes her legs have been giving her a lot of trouble with pain and weakness. Prior to this there was no prodrome, dizziness, change in vision, or chest pain. The other episodes were somewhat similar. She's been much less mobile in the last several weeks due to these problems. In general she's had a variety of issues. She's had some chest pain on-and-off but a bit vague and she blamed on age. There is no associated shortness of breath nausea or radiation of the pain. Her breathing has been doing okay. She's had no infectious symptoms in several days. She's been eating and drinking fairly well. Her weight is stable. She has chronic neck pain on the left side but got a bit worse. She's been started on no new medications. An earlier fall apparently led to what appears to be a nasal fracture. She's been having pain in the frontal area and spreading out across the parietal area. This is different than her usual headaches. Her vision has been stable. She has periods joint complaints. Her bowels are doing well. Her depressions doing well.  Review of Systems:  Review of Systems - negative except as above Past Medical History: Past Medical History  Diagnosis Date  . Insomnia   . Fibromyalgia   . Arthritis   . Meniere's disease 1989  Depression with suicide attempt in the past  Past History Past Medical History (reviewed - no changes required): Gait  disorder Fibromyalgia OA Depression Meniere's disease [facility] overnight admission for ingestion of Ambien/Tylenol/narcotics questionable suicide attempt November 2012 hyperlipidemia GERD HTN spinal stenosis chronic  back pain   Past Surgical History  Procedure Laterality Date  . Appendectomy    . Abdominal hysterectomy    Surgical History (reviewed - no changes required): EGD on 12/2003 Cardiolite Stress test on 2001 Bilateral Modified Radical Mastectomy in 1980 for unspecified precancerous condition  Medications: Prior to Admission medications   Medication Sig Start Date End Date Taking? Authorizing Provider  ALPRAZolam (XANAX) 0.25 MG tablet Take 0.25 mg by mouth 2 (two) times daily as needed.  08/09/13  Yes Historical Provider, MD  aspirin-acetaminophen-caffeine (EXCEDRIN EXTRA STRENGTH) [phone] MG per tablet Take 1 tablet by mouth every 6 (six) hours as needed. Used for pain    Yes Historical Provider, MD  diphenhydrAMINE (BENADRYL) 25 MG tablet Take 25 mg by mouth every 6 (six) hours as needed. For allergy    Yes Historical Provider, MD  escitalopram (LEXAPRO) 10 MG tablet Take 10 mg by mouth daily. 07/11/13  Yes Historical Provider, MD  HYDROcodone-acetaminophen (NORCO/VICODIN) 5-325 MG per tablet Take 1 tablet by mouth 3 (three) times daily after meals. 07/17/13  Yes Historical Provider, MD  methocarbamol (ROBAXIN) 500 MG tablet Take 500 mg by mouth 3 (three) times daily.     Yes Historical Provider, MD  omeprazole (PRILOSEC) 20 MG capsule Take 20  mg by mouth daily. 07/11/13  Yes Historical Provider, MD  ranitidine (ZANTAC) 150 MG tablet Take 150 mg by mouth 2 (two) times daily.     Yes Historical Provider, MD  traMADol (ULTRAM) 50 MG tablet Take 50 mg by mouth every 6 (six) hours as needed. Maximum dose= 8 tablets per day as needed for pain    Yes Historical Provider, MD  Vitamin D, Ergocalciferol, (DRISDOL) 50000 UNITS CAPS Take 50,000 Units by mouth. Takes  one cap on Mon, Wed, and Fri for 2 months, then 1 cap a week    Yes Historical Provider, MD  zolpidem (AMBIEN) 10 MG tablet Take 10 mg by mouth at bedtime as needed.     Yes Historical Provider, MD    Allergies:   Allergies  Allergen Reactions  . Shellfish Allergy     unknown    Social History:  reports that she has never smoked. She has never used smokeless tobacco. She reports that she drinks alcohol. She reports that she does not use illicit drugs.  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  Family History: Family History (reviewed - no changes required): mother died at age 37 from AAA father died at age 63 from lung cancer brother died age 40 from cancer another brother died from cancer another brother died from MI positive for heart disease, HTN, and diabetes  Physical Exam: Filed Vitals:   08/15/13 0330 08/15/13 0436 08/15/13 0501 08/15/13 0509  BP: 157/98 190/115 180/98 172/97  Pulse: 91 94 80   Temp:  97.2 F (36.2 C)    TempSrc:  Oral    Resp: 16 22 18    Height:  5\' 7"  (1.702 m)    Weight:  53.071 kg (117 lb)    SpO2: 95% 95%     General appearance: Frail thin white female lying perfectly flat with no discomfort. Extensive bruising in near closure of the left orbit is noted. Periorbital bruising is also present over the right and there is an contusion on the bridge of the nose. Extraocular movements are intact with no evidence of entrapment of the eye muscles. Minimal scleral redness is present on the left. Face is symmetric. Oral mucous membranes are moist. Jaw opens well no evidence of fracture or dislocation. No dental trauma is present. Neck: no adenopathy, no carotid bruit, no JVD and thyroid not enlarged, symmetric, no tenderness/mass/nodules Resp: Clear with no wheezes rales or  rhonchi. No accessory muscles are in use. Cardio: Regular and distant with occasional skips. GI: Scaphoid soft, non-tender; bowel sounds normal; no masses,  no organomegaly Extremities: Diminished pulses with no edema. Some bruising over the right knee is present. Lymph nodes: Cervical adenopathy: no cervical lymphadenopathy Neurologic: Alert and oriented X 3, speech is clear and fluent. Mentation is intact. No significant dysphoria is present. Grip is equal bilaterally. No cogwheel rigidity is present. No resting tremor is present. Gait was not tested. Dorsiflexion is strong bilaterally. No evidence of hallucination or delusions are present.  Labs on Admission:   Recent Labs  08/15/13 0029  NA 138  K 3.6  CL 103  CO2 24  GLUCOSE 122*  BUN 20  CREATININE 0.76  CALCIUM 10.0   No results found for this basename: AST, ALT, ALKPHOS, BILITOT, PROT, ALBUMIN,  in the last 72 hours No results  found for this basename: LIPASE, AMYLASE,  in the last 72 hours  Recent Labs  08/15/13 0029  WBC 16.1*  NEUTROABS 12.8*  HGB 15.2*  HCT 44.2  MCV 91.1  PLT 352    Recent Labs  08/15/13 0029  TROPONINI <0.30   Lab Results  Component Value Date   INR 0.95 08/15/2013      Radiological Exams on Admission: Ct Head Wo Contrast  08/15/2013   CLINICAL DATA:  Larey Seat last week with injury to nose in right eye, fell tonight injuring left forehead/supraorbital region, history Menieres disease  EXAM: CT HEAD WITHOUT CONTRAST  CT MAXILLOFACIAL WITHOUT CONTRAST  CT CERVICAL SPINE WITHOUT CONTRAST  TECHNIQUE: Multidetector CT imaging of the head, cervical spine, and maxillofacial structures were performed using the standard protocol without intravenous contrast. Multiplanar CT image reconstructions of the cervical spine and maxillofacial structures were also generated. Right side of face marked with a BB.  COMPARISON:  CT head 07/18/2011  FINDINGS: CT HEAD FINDINGS  Generalized atrophy.  Normal  ventricular morphology.  No midline shift or mass effect.  Small vessel chronic ischemic changes of deep cerebral white matter.  No intracranial hemorrhage, mass lesion, or acute infarction.  Visualized paranasal sinuses and mastoid air cells clear.  Bones unremarkable.  Left supraorbital/frontal scalp hematoma.  CT MAXILLOFACIAL FINDINGS  Left supraorbital and frontal scalp hematoma extending left periorbital and into nose.  Intraorbital soft tissue planes clear.  Visualized intracranial structures unremarkable.  Bones appear mildly demineralized.  Patient edentulous.  Paranasal sinuses, mastoid air cells and middle ear cavities clear.  Minimally displaced left nasal bone fracture identified.  No additional facial bone fracture seen.  Slight nasal septal deviation to the left.  CT CERVICAL SPINE FINDINGS  Cervical soft tissues unremarkable.  Lung apices clear.  Visualized skullbase intact.  Scattered atherosclerotic calcifications of the carotid systems.  Prevertebral soft tissues normal thickness.  Minimal disc space narrowing C5-C6.  Vertebral body and disc space heights otherwise maintained.  Mild scattered facet degenerative changes.  No acute fracture, subluxation, or bone destruction.  IMPRESSION: Atrophy with small vessel chronic ischemic changes of deep cerebral white matter.  No acute intracranial abnormalities.  Minimally displaced left nasal bone fracture.  Remaining facial bones unremarkable.  Left frontal/supraorbital scalp hematoma extending left periorbital and into nose.  No acute cervical spine abnormalities.   Electronically Signed   By: Ulyses Southward M.D.   On: 08/15/2013 01:32   Ct Cervical Spine Wo Contrast  08/15/2013   CLINICAL DATA:  Larey Seat last week with injury to nose in right eye, fell tonight injuring left forehead/supraorbital region, history Menieres disease  EXAM: CT HEAD WITHOUT CONTRAST  CT MAXILLOFACIAL WITHOUT CONTRAST  CT CERVICAL SPINE WITHOUT CONTRAST  TECHNIQUE: Multidetector  CT imaging of the head, cervical spine, and maxillofacial structures were performed using the standard protocol without intravenous contrast. Multiplanar CT image reconstructions of the cervical spine and maxillofacial structures were also generated. Right side of face marked with a BB.  COMPARISON:  CT head 07/18/2011  FINDINGS: CT HEAD FINDINGS  Generalized atrophy.  Normal ventricular morphology.  No midline shift or mass effect.  Small vessel chronic ischemic changes of deep cerebral white matter.  No intracranial hemorrhage, mass lesion, or acute infarction.  Visualized paranasal sinuses and mastoid air cells clear.  Bones unremarkable.  Left supraorbital/frontal scalp hematoma.  CT MAXILLOFACIAL FINDINGS  Left supraorbital and frontal scalp hematoma extending left periorbital and into nose.  Intraorbital soft tissue planes clear.  Visualized  intracranial structures unremarkable.  Bones appear mildly demineralized.  Patient edentulous.  Paranasal sinuses, mastoid air cells and middle ear cavities clear.  Minimally displaced left nasal bone fracture identified.  No additional facial bone fracture seen.  Slight nasal septal deviation to the left.  CT CERVICAL SPINE FINDINGS  Cervical soft tissues unremarkable.  Lung apices clear.  Visualized skullbase intact.  Scattered atherosclerotic calcifications of the carotid systems.  Prevertebral soft tissues normal thickness.  Minimal disc space narrowing C5-C6.  Vertebral body and disc space heights otherwise maintained.  Mild scattered facet degenerative changes.  No acute fracture, subluxation, or bone destruction.  IMPRESSION: Atrophy with small vessel chronic ischemic changes of deep cerebral white matter.  No acute intracranial abnormalities.  Minimally displaced left nasal bone fracture.  Remaining facial bones unremarkable.  Left frontal/supraorbital scalp hematoma extending left periorbital and into nose.  No acute cervical spine abnormalities.   Electronically  Signed   By: Ulyses Southward M.D.   On: 08/15/2013 01:32   Dg Chest Port 1 View  08/15/2013   CLINICAL DATA:  Hypertensive and tachycardic after fall.  Chest pain  EXAM: PORTABLE CHEST - 1 VIEW  COMPARISON:  None.  FINDINGS: No cardiomegaly. Lower mediastinal gas filled mass consistent with hiatal hernia. No evidence of fracture. No effusion or pneumothorax. No edema or infiltrate.  IMPRESSION: No active disease.   Electronically Signed   By: Tiburcio Pea M.D.   On: 08/15/2013 01:15   Ct Maxillofacial Wo Cm  08/15/2013   CLINICAL DATA:  Larey Seat last week with injury to nose in right eye, fell tonight injuring left forehead/supraorbital region, history Menieres disease  EXAM: CT HEAD WITHOUT CONTRAST  CT MAXILLOFACIAL WITHOUT CONTRAST  CT CERVICAL SPINE WITHOUT CONTRAST  TECHNIQUE: Multidetector CT imaging of the head, cervical spine, and maxillofacial structures were performed using the standard protocol without intravenous contrast. Multiplanar CT image reconstructions of the cervical spine and maxillofacial structures were also generated. Right side of face marked with a BB.  COMPARISON:  CT head 07/18/2011  FINDINGS: CT HEAD FINDINGS  Generalized atrophy.  Normal ventricular morphology.  No midline shift or mass effect.  Small vessel chronic ischemic changes of deep cerebral white matter.  No intracranial hemorrhage, mass lesion, or acute infarction.  Visualized paranasal sinuses and mastoid air cells clear.  Bones unremarkable.  Left supraorbital/frontal scalp hematoma.  CT MAXILLOFACIAL FINDINGS  Left supraorbital and frontal scalp hematoma extending left periorbital and into nose.  Intraorbital soft tissue planes clear.  Visualized intracranial structures unremarkable.  Bones appear mildly demineralized.  Patient edentulous.  Paranasal sinuses, mastoid air cells and middle ear cavities clear.  Minimally displaced left nasal bone fracture identified.  No additional facial bone fracture seen.  Slight nasal  septal deviation to the left.  CT CERVICAL SPINE FINDINGS  Cervical soft tissues unremarkable.  Lung apices clear.  Visualized skullbase intact.  Scattered atherosclerotic calcifications of the carotid systems.  Prevertebral soft tissues normal thickness.  Minimal disc space narrowing C5-C6.  Vertebral body and disc space heights otherwise maintained.  Mild scattered facet degenerative changes.  No acute fracture, subluxation, or bone destruction.  IMPRESSION: Atrophy with small vessel chronic ischemic changes of deep cerebral white matter.  No acute intracranial abnormalities.  Minimally displaced left nasal bone fracture.  Remaining facial bones unremarkable.  Left frontal/supraorbital scalp hematoma extending left periorbital and into nose.  No acute cervical spine abnormalities.   Electronically Signed   By: Ulyses Southward M.D.   On: 08/15/2013  01:32   Orders placed during the hospital encounter of 08/15/13  . EKG 12-LEAD: Sinus tachycardia Premature ventricular complexes Non-specific ST-t changes Abnormal ekg Since last tracing Non-specific ST-t changes now present.  . EKG 12-LEAD    Assessment/Plan Active Problems:   Syncope: She has had 3 falls. Not sure if there was total LOC. CT head looks okay. We'll check an echo and carotid Dopplers. She is having some ectopy but EKG is nonischemic. First troponin was negative. Gait issues clearly are in issue and physical therapy be ordered. We'll not do an MRI at this point yet.   Accelerated hypertension: This is an insignificant problem. We'll check a renal ultrasound and echo. Bladder calcium channel blocker in case his renal artery stenosis underlying this. We're trying not to lower the blood pressure too quickly. When at our ofc in late OCT Entered weight:  117  lbs., Calculated Weight: 117 lbs., ( 53.07 kg) Height: 68. in., ( 172.72 cm) Pulse rate: 94 Pulse rhythm: regular  Blood Pressure #1: 170 / 98 mm Hg    Facial contusion: Extensive hematoma  is present. Fortunately no fractures are present.   Unstable gait: We'll get physical therapy to help with this.   Depression: She is to be doing okay. Peripheral vascular disease: No pain at rest CODE STATUS: Full Osteoporosis: We'll see if a DEXA has been done CHEST PAIN: EKG non ischemic, needs outpt eval   Zania Kalisz ALAN 08/15/2013, 7:32 AM

## 2013-08-15 NOTE — ED Provider Notes (Signed)
CSN: [id number]     Arrival date & time 08/14/13  2357 History   First MD Initiated Contact with Patient 08/15/13 0015     Chief Complaint  Patient presents with  . Fall   (Consider location/radiation/quality/duration/timing/severity/associated sxs/prior Treatment) HPI Comments: 79 year old female, history of insomnia, arthritis and Mnire's disease. She presents because she has had another fall this evening.  She lives with family members including her daughter and her children. She states that she has been feeling generally weak, this is nonfocal and has been going on for some time, greater than 1 month. She has had 3 falls over the last month, she has not been evaluated after either of the first 2 falls. She does note having swelling around her nose and bruising around her eyes after the first fall and thinks that she probably broke her nose at that time. This healed well and she had been doing okay from a traumatic standpoint however this evening as she was trying to stand up from the edge of the bed she felt as though her right leg was causing pain which made it difficult for her to stand up, in addition she became lightheaded with standing and fell striking her head on a chair. Paramedics found the patient with tachycardia, a large hematoma to the left forehead but normal mental status for the patient. She denies chest pain, shortness of breath though she does note that she has had a cough for several months. She is not on antihypertensives but was noted to be severely hypertensive at 200/100. She denies dysuria, diarrhea, rashes. She does have some abdominal discomfort that gets worse when she coughs, she has no abdominal pain unless she is coughing.  Patient is a 79 y.o. female presenting with fall. The history is provided by the patient, medical records and the EMS personnel.  Fall    Past Medical History  Diagnosis Date  . Insomnia   . Fibromyalgia   . Arthritis   . Meniere's disease  1989   Past Surgical History  Procedure Laterality Date  . Appendectomy    . Abdominal hysterectomy     No family history on file. History  Substance Use Topics  . Smoking status: Never Smoker   . Smokeless tobacco: Never Used  . Alcohol Use: Yes     Comment: drinks 1 glass of liquor maybe twice a year   OB History   Grav Para Term Preterm Abortions TAB SAB Ect Mult Living                 Review of Systems  All other systems reviewed and are negative.    Allergies  Shellfish allergy  Home Medications   Current Outpatient Rx  Name  Route  Sig  Dispense  Refill  . ALPRAZolam (XANAX) 0.25 MG tablet   Oral   Take 0.25 mg by mouth 2 (two) times daily as needed.          Marland Kitchen aspirin-acetaminophen-caffeine (EXCEDRIN EXTRA STRENGTH) 250-250-65 MG per tablet   Oral   Take 1 tablet by mouth every 6 (six) hours as needed. Used for pain          . diphenhydrAMINE (BENADRYL) 25 MG tablet   Oral   Take 25 mg by mouth every 6 (six) hours as needed. For allergy          . escitalopram (LEXAPRO) 10 MG tablet   Oral   Take 10 mg by mouth daily.         Marland Kitchen  HYDROcodone-acetaminophen (NORCO/VICODIN) 5-325 MG per tablet   Oral   Take 1 tablet by mouth 3 (three) times daily after meals.         . methocarbamol (ROBAXIN) 500 MG tablet   Oral   Take 500 mg by mouth 3 (three) times daily.           Marland Kitchen omeprazole (PRILOSEC) 20 MG capsule   Oral   Take 20 mg by mouth daily.         . ranitidine (ZANTAC) 150 MG tablet   Oral   Take 150 mg by mouth 2 (two) times daily.           . traMADol (ULTRAM) 50 MG tablet   Oral   Take 50 mg by mouth every 6 (six) hours as needed. Maximum dose= 8 tablets per day as needed for pain          . Vitamin D, Ergocalciferol, (DRISDOL) 50000 UNITS CAPS   Oral   Take 50,000 Units by mouth. Takes one cap on Mon, Wed, and Fri for 2 months, then 1 cap a week          . zolpidem (AMBIEN) 10 MG tablet   Oral   Take 10 mg by mouth  at bedtime as needed.            BP 162/88  Pulse 86  Temp(Src) 98 F (36.7 C) (Oral)  Resp 19  SpO2 95% Physical Exam  Nursing note and vitals reviewed. Constitutional: She appears well-developed and well-nourished. No distress.  HENT:  Head: Normocephalic.  Mouth/Throat: Oropharynx is clear and moist. No oropharyngeal exudate.  No hemotympanum, no malocclusion, bilateral periorbital hematomas which appear dark purple and old, large left for head hematoma which is tender, acute in appearance with some swelling of the left eyelids.  Eyes: Conjunctivae and EOM are normal. Pupils are equal, round, and reactive to light. Right eye exhibits no discharge. Left eye exhibits no discharge. No scleral icterus.  Normal appearing and reactive pupils, clear conjunctiva, normal extraocular movement  Neck: Normal range of motion. Neck supple. No JVD present. No thyromegaly present.  No tenderness with range of motion of the neck  Cardiovascular: Regular rhythm, normal heart sounds and intact distal pulses.  Exam reveals no gallop and no friction rub.   No murmur heard. Tachycardic to 120 beats per minute with strong radial artery pulse  Pulmonary/Chest: Effort normal and breath sounds normal. No respiratory distress. She has no wheezes. She has no rales.  Abdominal: Soft. Bowel sounds are normal. She exhibits no distension and no mass. There is no tenderness.  Musculoskeletal: Normal range of motion. She exhibits tenderness ( Minimal tenderness with slight bruising to the right knee though she is able to straight leg raise and bend her knee with mild pain, no crepitus, no deformity). She exhibits no edema.  Lymphadenopathy:    She has no cervical adenopathy.  Neurological: She is alert. Coordination normal.  Moves all extremities x4 with normal strength and sensation, clear sensorium, follows commands, clear memory. No discoordination, cranial nerves III through XII appear intact though the swelling  to the left side of the face makes facial droop difficult to test.  Skin: Skin is warm and dry.  Slight abrasion to the left volar forearm, hematoma on the left side of the face, slight hematoma to the right knee  Psychiatric: She has a normal mood and affect. Her behavior is normal.    ED Course  Procedures (including critical  care time) Labs Review Labs Reviewed  BASIC METABOLIC PANEL - Abnormal; Notable for the following:    Glucose, Bld 122 (*)    GFR calc non Af Amer 76 (*)    GFR calc Af Amer 88 (*)    All other components within normal limits  CBC WITH DIFFERENTIAL - Abnormal; Notable for the following:    WBC 16.1 (*)    Hemoglobin 15.2 (*)    Neutrophils Relative % 79 (*)    Neutro Abs 12.8 (*)    All other components within normal limits  URINALYSIS, ROUTINE W REFLEX MICROSCOPIC - Abnormal; Notable for the following:    Hgb urine dipstick MODERATE (*)    Leukocytes, UA SMALL (*)    All other components within normal limits  TROPONIN I  APTT  PROTIME-INR  URINE MICROSCOPIC-ADD ON  ACETAMINOPHEN LEVEL  SALICYLATE LEVEL  CG4 I-STAT (LACTIC ACID)   Imaging Review Ct Head Wo Contrast  08/15/2013   CLINICAL DATA:  Larey Seat last week with injury to nose in right eye, fell tonight injuring left forehead/supraorbital region, history Menieres disease  EXAM: CT HEAD WITHOUT CONTRAST  CT MAXILLOFACIAL WITHOUT CONTRAST  CT CERVICAL SPINE WITHOUT CONTRAST  TECHNIQUE: Multidetector CT imaging of the head, cervical spine, and maxillofacial structures were performed using the standard protocol without intravenous contrast. Multiplanar CT image reconstructions of the cervical spine and maxillofacial structures were also generated. Right side of face marked with a BB.  COMPARISON:  CT head 07/18/2011  FINDINGS: CT HEAD FINDINGS  Generalized atrophy.  Normal ventricular morphology.  No midline shift or mass effect.  Small vessel chronic ischemic changes of deep cerebral white matter.  No  intracranial hemorrhage, mass lesion, or acute infarction.  Visualized paranasal sinuses and mastoid air cells clear.  Bones unremarkable.  Left supraorbital/frontal scalp hematoma.  CT MAXILLOFACIAL FINDINGS  Left supraorbital and frontal scalp hematoma extending left periorbital and into nose.  Intraorbital soft tissue planes clear.  Visualized intracranial structures unremarkable.  Bones appear mildly demineralized.  Patient edentulous.  Paranasal sinuses, mastoid air cells and middle ear cavities clear.  Minimally displaced left nasal bone fracture identified.  No additional facial bone fracture seen.  Slight nasal septal deviation to the left.  CT CERVICAL SPINE FINDINGS  Cervical soft tissues unremarkable.  Lung apices clear.  Visualized skullbase intact.  Scattered atherosclerotic calcifications of the carotid systems.  Prevertebral soft tissues normal thickness.  Minimal disc space narrowing C5-C6.  Vertebral body and disc space heights otherwise maintained.  Mild scattered facet degenerative changes.  No acute fracture, subluxation, or bone destruction.  IMPRESSION: Atrophy with small vessel chronic ischemic changes of deep cerebral white matter.  No acute intracranial abnormalities.  Minimally displaced left nasal bone fracture.  Remaining facial bones unremarkable.  Left frontal/supraorbital scalp hematoma extending left periorbital and into nose.  No acute cervical spine abnormalities.   Electronically Signed   By: Ulyses Southward M.D.   On: 08/15/2013 01:32   Ct Cervical Spine Wo Contrast  08/15/2013   CLINICAL DATA:  Larey Seat last week with injury to nose in right eye, fell tonight injuring left forehead/supraorbital region, history Menieres disease  EXAM: CT HEAD WITHOUT CONTRAST  CT MAXILLOFACIAL WITHOUT CONTRAST  CT CERVICAL SPINE WITHOUT CONTRAST  TECHNIQUE: Multidetector CT imaging of the head, cervical spine, and maxillofacial structures were performed using the standard protocol without intravenous  contrast. Multiplanar CT image reconstructions of the cervical spine and maxillofacial structures were also generated. Right side of face marked  with a BB.  COMPARISON:  CT head 07/18/2011  FINDINGS: CT HEAD FINDINGS  Generalized atrophy.  Normal ventricular morphology.  No midline shift or mass effect.  Small vessel chronic ischemic changes of deep cerebral white matter.  No intracranial hemorrhage, mass lesion, or acute infarction.  Visualized paranasal sinuses and mastoid air cells clear.  Bones unremarkable.  Left supraorbital/frontal scalp hematoma.  CT MAXILLOFACIAL FINDINGS  Left supraorbital and frontal scalp hematoma extending left periorbital and into nose.  Intraorbital soft tissue planes clear.  Visualized intracranial structures unremarkable.  Bones appear mildly demineralized.  Patient edentulous.  Paranasal sinuses, mastoid air cells and middle ear cavities clear.  Minimally displaced left nasal bone fracture identified.  No additional facial bone fracture seen.  Slight nasal septal deviation to the left.  CT CERVICAL SPINE FINDINGS  Cervical soft tissues unremarkable.  Lung apices clear.  Visualized skullbase intact.  Scattered atherosclerotic calcifications of the carotid systems.  Prevertebral soft tissues normal thickness.  Minimal disc space narrowing C5-C6.  Vertebral body and disc space heights otherwise maintained.  Mild scattered facet degenerative changes.  No acute fracture, subluxation, or bone destruction.  IMPRESSION: Atrophy with small vessel chronic ischemic changes of deep cerebral white matter.  No acute intracranial abnormalities.  Minimally displaced left nasal bone fracture.  Remaining facial bones unremarkable.  Left frontal/supraorbital scalp hematoma extending left periorbital and into nose.  No acute cervical spine abnormalities.   Electronically Signed   By: Ulyses Southward M.D.   On: 08/15/2013 01:32   Dg Chest Port 1 View  08/15/2013   CLINICAL DATA:  Hypertensive and  tachycardic after fall.  Chest pain  EXAM: PORTABLE CHEST - 1 VIEW  COMPARISON:  None.  FINDINGS: No cardiomegaly. Lower mediastinal gas filled mass consistent with hiatal hernia. No evidence of fracture. No effusion or pneumothorax. No edema or infiltrate.  IMPRESSION: No active disease.   Electronically Signed   By: Tiburcio Pea M.D.   On: 08/15/2013 01:15   Ct Maxillofacial Wo Cm  08/15/2013   CLINICAL DATA:  Larey Seat last week with injury to nose in right eye, fell tonight injuring left forehead/supraorbital region, history Menieres disease  EXAM: CT HEAD WITHOUT CONTRAST  CT MAXILLOFACIAL WITHOUT CONTRAST  CT CERVICAL SPINE WITHOUT CONTRAST  TECHNIQUE: Multidetector CT imaging of the head, cervical spine, and maxillofacial structures were performed using the standard protocol without intravenous contrast. Multiplanar CT image reconstructions of the cervical spine and maxillofacial structures were also generated. Right side of face marked with a BB.  COMPARISON:  CT head 07/18/2011  FINDINGS: CT HEAD FINDINGS  Generalized atrophy.  Normal ventricular morphology.  No midline shift or mass effect.  Small vessel chronic ischemic changes of deep cerebral white matter.  No intracranial hemorrhage, mass lesion, or acute infarction.  Visualized paranasal sinuses and mastoid air cells clear.  Bones unremarkable.  Left supraorbital/frontal scalp hematoma.  CT MAXILLOFACIAL FINDINGS  Left supraorbital and frontal scalp hematoma extending left periorbital and into nose.  Intraorbital soft tissue planes clear.  Visualized intracranial structures unremarkable.  Bones appear mildly demineralized.  Patient edentulous.  Paranasal sinuses, mastoid air cells and middle ear cavities clear.  Minimally displaced left nasal bone fracture identified.  No additional facial bone fracture seen.  Slight nasal septal deviation to the left.  CT CERVICAL SPINE FINDINGS  Cervical soft tissues unremarkable.  Lung apices clear.  Visualized  skullbase intact.  Scattered atherosclerotic calcifications of the carotid systems.  Prevertebral soft tissues normal thickness.  Minimal  disc space narrowing C5-C6.  Vertebral body and disc space heights otherwise maintained.  Mild scattered facet degenerative changes.  No acute fracture, subluxation, or bone destruction.  IMPRESSION: Atrophy with small vessel chronic ischemic changes of deep cerebral white matter.  No acute intracranial abnormalities.  Minimally displaced left nasal bone fracture.  Remaining facial bones unremarkable.  Left frontal/supraorbital scalp hematoma extending left periorbital and into nose.  No acute cervical spine abnormalities.   Electronically Signed   By: Ulyses Southward M.D.   On: 08/15/2013 01:32    EKG Interpretation    Date/Time:  Friday August 15 2013 00:06:44 EST Ventricular Rate:  122 PR Interval:  146 QRS Duration: 90 QT Interval:  335 QTC Calculation: 477 R Axis:   52 Text Interpretation:  Sinus tachycardia Premature ventricular complexes Non-specific ST-t changes Abnormal ekg Since last tracing Non-specific ST-t changes now present. Confirmed by Marijane Trower  MD, Ellamae Lybeck [phone]) on 08/15/2013 12:29:11 AM            MDM   1. Severe hypertension   2. Syncope   3. Contusion of scalp, initial encounter   4. Leukocytosis   5. Nasal bone fracture, closed, initial encounter    There is no other significant traumatic injuries other than the head, neck and facial bones and possibly neck. CT scans have been ordered, the patient is tachycardic and hypertensive for an unknown reason. She has been generally weak, would consider metabolic, cardiac or infectious etiology to her symptoms especially given her cough over the last several months. She will get a chest x-ray to evaluate for possible tumor burden, pneumonia, less likely to be pneumothorax. We'll follow blood pressure and use antihypertensives if she remains hypertensive. Urinalysis, electrolytes, troponin. EKG  reveals a sinus tachycardia with nonspecific ST and T waves.    Pt has ongoing severe htn - will start antihypertensives - she has no other sig findings on her initial w/u including no anemia, no electrolyte disturbances and no UTI / pna.  She has normal CT of the brain though she has no bleeding or fractures, she does have superficial soft tissue hematoma as suspected.  She has had some fluids.  Started labetalol and cardene for severe hypertension.  Possible PRESS syndrome with hypertension, mild headache, and light headed / syncope.    D/w Dr. Evlyn Kanner, agrees with temp orders.  Vida Roller, MD 08/15/13 [phone]

## 2013-08-15 NOTE — Evaluation (Signed)
Physical Therapy Evaluation Patient Details Name: Nicole Key MRN: [id number] DOB: May 21, 1928 Today's Date: 08/15/2013 Time: 7829-5621 PT Time Calculation (min): 18 min  PT Assessment / Plan / Recommendation History of Present Illness  79 yo female admitted with syncope, fall, hematoma, nasal bone fx, hypertension, weakness. Hx of Meniere's disease, athritis.   Clinical Impression  On eval, pt required Min assist for bed mobility. Pt only able to tolerate sitting EOB for ~30 seconds before requesting to lie back down and doing so abruptly. Explained to pt/family that they may need to consider rehab unless pt's mobility improves significantly. Pt is not agreeable to rehab at this time and she prefers to d/c home.     PT Assessment  Patient needs continued PT services    Follow Up Recommendations  SNF;Supervision/Assistance - 24 hour (unless mobility mproves significantly)    Does the patient have the potential to tolerate intense rehabilitation      Barriers to Discharge        Equipment Recommendations  None recommended by PT    Recommendations for Other Services OT consult   Frequency Min 3X/week    Precautions / Restrictions Precautions Precautions: Fall Precaution Comments: dizziness Restrictions Weight Bearing Restrictions: No   Pertinent Vitals/Pain Head-unrated      Mobility  Bed Mobility Bed Mobility: Supine to Sit;Sit to Supine Supine to Sit: 4: Min assist;HOB elevated Sit to Supine: 4: Min assist;HOB elevated Details for Bed Mobility Assistance: Increased time assist for trunk and LEs. Sat EOB for ~30 seconds before abruptly lying back down - "I gotta lie down. My head feels spacey" Transfers Details for Transfer Assistance: Pt declined to attempt Ambulation/Gait Ambulation/Gait Assistance: Not tested (comment)    Exercises     PT Diagnosis: Difficulty walking;Generalized weakness;Acute pain  PT Problem List: Decreased strength;Decreased activity  tolerance;Decreased balance;Decreased mobility;Decreased knowledge of use of DME;Pain PT Treatment Interventions: DME instruction;Gait training;Functional mobility training;Therapeutic activities;Therapeutic exercise;Patient/family education     PT Goals(Current goals can be found in the care plan section) Acute Rehab PT Goals Patient Stated Goal: no more falls PT Goal Formulation: With patient/family Time For Goal Achievement: 08/29/13 Potential to Achieve Goals: Good  Visit Information  Last PT Received On: 08/15/13 Assistance Needed: +2 (safety) History of Present Illness: 79 yo female admitted with syncope, fall, hematoma, nasal bone fx, hypertension, weakness. Hx of Meniere's disease, athritis.        Prior Functioning  Home Living Family/patient expects to be discharged to:: Private residence Living Arrangements: Children;Other (Comment) (someone all the time per daughter; alone for maybe an hour a day) Type of Home: House Home Access: Stairs to enter [employer] of Steps: 3 Entrance Stairs-Rails: Right;Left Home Layout: Two level Alternate Level Stairs-Number of Steps: 1 flight Home Equipment: Walker - 2 wheels;Wheelchair - manual;Cane - single point;Cane - quad Prior Function Level of Independence: Needs assistance Gait / Transfers Assistance Needed: per pt, she furniture walks ADL's / Homemaking Assistance Needed: sponge bathing mostly Comments: multiple falls since October. legs give way.  Communication Communication: No difficulties    Cognition  Cognition Arousal/Alertness: Awake/alert Behavior During Therapy: WFL for tasks assessed/performed Overall Cognitive Status: Impaired/Different from baseline    Extremity/Trunk Assessment Upper Extremity Assessment Upper Extremity Assessment: Generalized weakness Lower Extremity Assessment Lower Extremity Assessment: Generalized weakness Cervical / Trunk Assessment Cervical / Trunk Assessment: Normal    Balance    End of Session PT - End of Session Activity Tolerance: Other (comment) (Limited by dizziness? pt not willing  to attempt further mobility) Patient left: in bed;with call bell/phone within reach;with bed alarm set;with family/visitor present  GP     Rebeca Alert, MPT Pager: [phone]

## 2013-08-15 NOTE — Progress Notes (Signed)
Bilateral carotid artery duplex:  1-39% ICA stenosis.  Vertebral artery flow is antegrade.     

## 2013-08-15 NOTE — Care Management Note (Addendum)
    Page 1 of 2   08/19/2013     4:12:01 PM   CARE MANAGEMENT NOTE 08/19/2013  Patient:  Nicole Key, Nicole Key   Account Number:  [id number]  Date Initiated:  08/15/2013  Documentation initiated by:  Lanier Clam  Subjective/Objective Assessment:   SYNCOPE     Action/Plan:   FROM HOME   Anticipated DC Date:  08/19/2013   Anticipated DC Plan:  HOME W HOME HEALTH SERVICES      DC Planning Services  CM consult      Choice offered to / List presented to:  C-1 Patient        HH arranged  HH-1 RN  HH-2 PT  HH-3 OT  HH-4 NURSE'S AIDE      HH agency  [facility]   Status of service:  Completed, signed off Medicare Important Message given?   (If response is "NO", the following Medicare IM given date fields will be blank) Date Medicare IM given:   Date Additional Medicare IM given:    Discharge Disposition:  HOME W HOME HEALTH SERVICES  Per UR Regulation:  Reviewed for med. necessity/level of care/duration of stay  If discussed at Long Length of Stay Meetings, dates discussed:   08/19/2013    Comments:  08/19/13 Chandlar Guice RN,BSN NCM [phone] [facility] AWARE OF D/C & HHC ORDERS.NO FURTHER D/C NEEDS.  08/18/13 Jeanluc Wegman RN,BSN NCM [phone] PER MD NOT MEDICALLY STABLE FOR D/C TODAY.WBC ELEVATED. PATIENT PLEASANTLY DECLINES SNF.SPOKE TO PATIENT/DTR, WHILE NURSE IN RM TO CONFIRM D/C PLANS.HIGHLY ENCOURAGED PT RECOMMENDATIONS OF SNF, & BENEFITS,BUT PATIENT DECLINES SNF.HHC LIST PROVIDED,EXPLAINED THE INTERMITTENT SKILLED SERVICES,& ALSO DIFFERENCE BETWEEN PRIVATE DUTY CARE SERVICES-CUSTODIAL,INDEPENDENT DECISION,& OUT OF POCKET COST.PATIENT/DTR BOTH VOICED UNDERSTANDING.[facility] CHOSEN FOR HHC.RECOMMEND HHRN/PT/OT/SW/NURSE'S AIDE.TC [facility] REP STEPHANIE INFORMED OF REFERRAL & POSSIBLE D/C.AWAIT FINAL HHC ORDERS,& FACE TO FACE.[facility] ALL DME-CANE,RW,3N1.  Emon Lance RNBSN NCM AWAIT PT OT RECOMMENDATIONS

## 2013-08-16 LAB — COMPREHENSIVE METABOLIC PANEL
ALT: 8 U/L (ref 0–35)
AST: 12 U/L (ref 0–37)
Albumin: 3.1 g/dL — ABNORMAL LOW (ref 3.5–5.2)
Calcium: 8.9 mg/dL (ref 8.4–10.5)
Creatinine, Ser: 0.66 mg/dL (ref 0.50–1.10)
Sodium: 139 mEq/L (ref 135–145)

## 2013-08-16 LAB — CBC
Hemoglobin: 12.8 g/dL (ref 12.0–15.0)
MCH: 30.7 pg (ref 26.0–34.0)
MCV: 92.6 fL (ref 78.0–100.0)
Platelets: 291 10*3/uL (ref 150–400)
RBC: 4.17 MIL/uL (ref 3.87–5.11)
RDW: 15.4 % (ref 11.5–15.5)

## 2013-08-16 NOTE — Progress Notes (Signed)
PT Cancellation Note  ___Treatment cancelled today due to medical issues with patient which prohibited therapy  ___ Treatment cancelled today due to patient receiving procedure or test   ___ Treatment cancelled today due to patient's refusal to participate   _X_ Treatment cancelled today due to pt's request to rest.  Reported she just got back to bed from walking to and from [place] and got really tiered.  "I almost didn't make it back". "My head is still dizzy".  Felecia Shelling  PTA [facility]  Acute  Rehab Pager      [phone]

## 2013-08-16 NOTE — Progress Notes (Signed)
Subjective: Patient alert, some Schult-term memory deficits, however appropriate, answering all questions appropriately, complaining of some headaches, just procedure pain medications, continues to have significant bruising on the face, with difficulty moving some the facial muscles, appetite somewhat suppressed, but denies nausea, vomiting, chest pain, shortness of breath at this time, denies abdominal discomfort, daughter present, very supportive, still very weak  Objective: Vital signs in last 24 hours: Temp:  [98 F (36.7 C)-98.3 F (36.8 C)] 98 F (36.7 C) (12/20 0513) Pulse Rate:  [86-112] 86 (12/20 0513) Resp:  [20] 20 (12/20 0513) BP: (154-175)/(79-93) 154/90 mmHg (12/20 0513) SpO2:  [95 %-98 %] 98 % (12/20 0513) Weight change:  Last BM Date: 08/14/13  CBG (last 3)  No results found for this basename: GLUCAP,  in the last 72 hours  Intake/Output from previous day: 12/19 0701 - 12/20 0700 In: 1278.8 [P.O.:660; I.V.:618.8] Out: 1200 [Urine:1200] Intake/Output this shift:   General appearance-frail, thin, cachectic, with significant extensive frontal facial bruising, below bilateral orbits with abrasion on the bridge of the nose. Sclera anicteric, no difficulties with extraocular movements, face is otherwise symmetric except for bruising and swelling. No oropharyngeal lesions, able to converse without hindrance Neck supple, no cervical lymphadenopathy, motion intact, no tenderness to palpation of the cervical spine Lungs clear to auscultation bilaterally with no respiratory distress Cardio vascular reveals distant heart sounds with regular rate Abdomen-soft, nontender, nondistended, nontender X line extremities no significant edema, reasoning present, no cyanosis but diminished pulses Alert and oriented x3, conversant, no resting tremors, light touch intact, can move all 4 extremities, gait not tested  Lab Results:  Recent Labs  08/15/13 0029 08/16/13 0632  NA 138 139  K  3.6 3.6  CL 103 104  CO2 24 25  GLUCOSE 122* 108*  BUN 20 11  CREATININE 0.76 0.66  CALCIUM 10.0 8.9    Recent Labs  08/16/13 0632  AST 12  ALT 8  ALKPHOS 60  BILITOT 0.4  PROT 6.5  ALBUMIN 3.1*    Recent Labs  08/15/13 0029 08/16/13 0632  WBC 16.1* 9.2  NEUTROABS 12.8*  --   HGB 15.2* 12.8  HCT 44.2 38.6  MCV 91.1 92.6  PLT 352 291    Recent Labs  08/15/13 0029  TROPONINI <0.30   No results found for this basename: TSH, T4TOTAL, FREET3, T3FREE, THYROIDAB,  in the last 72 hours No results found for this basename: VITAMINB12, FOLATE, FERRITIN, TIBC, IRON, RETICCTPCT,  in the last 72 hours  Studies/Results: Ct Head Wo Contrast  08/15/2013   CLINICAL DATA:  Larey Seat last week with injury to nose in right eye, fell tonight injuring left forehead/supraorbital region, history Menieres disease  EXAM: CT HEAD WITHOUT CONTRAST  CT MAXILLOFACIAL WITHOUT CONTRAST  CT CERVICAL SPINE WITHOUT CONTRAST  TECHNIQUE: Multidetector CT imaging of the head, cervical spine, and maxillofacial structures were performed using the standard protocol without intravenous contrast. Multiplanar CT image reconstructions of the cervical spine and maxillofacial structures were also generated. Right side of face marked with a BB.  COMPARISON:  CT head 07/18/2011  FINDINGS: CT HEAD FINDINGS  Generalized atrophy.  Normal ventricular morphology.  No midline shift or mass effect.  Small vessel chronic ischemic changes of deep cerebral white matter.  No intracranial hemorrhage, mass lesion, or acute infarction.  Visualized paranasal sinuses and mastoid air cells clear.  Bones unremarkable.  Left supraorbital/frontal scalp hematoma.  CT MAXILLOFACIAL FINDINGS  Left supraorbital and frontal scalp hematoma extending left periorbital and into nose.  Intraorbital soft tissue planes clear.  Visualized intracranial structures unremarkable.  Bones appear mildly demineralized.  Patient edentulous.  Paranasal sinuses, mastoid  air cells and middle ear cavities clear.  Minimally displaced left nasal bone fracture identified.  No additional facial bone fracture seen.  Slight nasal septal deviation to the left.  CT CERVICAL SPINE FINDINGS  Cervical soft tissues unremarkable.  Lung apices clear.  Visualized skullbase intact.  Scattered atherosclerotic calcifications of the carotid systems.  Prevertebral soft tissues normal thickness.  Minimal disc space narrowing C5-C6.  Vertebral body and disc space heights otherwise maintained.  Mild scattered facet degenerative changes.  No acute fracture, subluxation, or bone destruction.  IMPRESSION: Atrophy with small vessel chronic ischemic changes of deep cerebral white matter.  No acute intracranial abnormalities.  Minimally displaced left nasal bone fracture.  Remaining facial bones unremarkable.  Left frontal/supraorbital scalp hematoma extending left periorbital and into nose.  No acute cervical spine abnormalities.   Electronically Signed   By: Ulyses Southward M.D.   On: 08/15/2013 01:32   Ct Cervical Spine Wo Contrast  08/15/2013   CLINICAL DATA:  Larey Seat last week with injury to nose in right eye, fell tonight injuring left forehead/supraorbital region, history Menieres disease  EXAM: CT HEAD WITHOUT CONTRAST  CT MAXILLOFACIAL WITHOUT CONTRAST  CT CERVICAL SPINE WITHOUT CONTRAST  TECHNIQUE: Multidetector CT imaging of the head, cervical spine, and maxillofacial structures were performed using the standard protocol without intravenous contrast. Multiplanar CT image reconstructions of the cervical spine and maxillofacial structures were also generated. Right side of face marked with a BB.  COMPARISON:  CT head 07/18/2011  FINDINGS: CT HEAD FINDINGS  Generalized atrophy.  Normal ventricular morphology.  No midline shift or mass effect.  Small vessel chronic ischemic changes of deep cerebral white matter.  No intracranial hemorrhage, mass lesion, or acute infarction.  Visualized paranasal sinuses and  mastoid air cells clear.  Bones unremarkable.  Left supraorbital/frontal scalp hematoma.  CT MAXILLOFACIAL FINDINGS  Left supraorbital and frontal scalp hematoma extending left periorbital and into nose.  Intraorbital soft tissue planes clear.  Visualized intracranial structures unremarkable.  Bones appear mildly demineralized.  Patient edentulous.  Paranasal sinuses, mastoid air cells and middle ear cavities clear.  Minimally displaced left nasal bone fracture identified.  No additional facial bone fracture seen.  Slight nasal septal deviation to the left.  CT CERVICAL SPINE FINDINGS  Cervical soft tissues unremarkable.  Lung apices clear.  Visualized skullbase intact.  Scattered atherosclerotic calcifications of the carotid systems.  Prevertebral soft tissues normal thickness.  Minimal disc space narrowing C5-C6.  Vertebral body and disc space heights otherwise maintained.  Mild scattered facet degenerative changes.  No acute fracture, subluxation, or bone destruction.  IMPRESSION: Atrophy with small vessel chronic ischemic changes of deep cerebral white matter.  No acute intracranial abnormalities.  Minimally displaced left nasal bone fracture.  Remaining facial bones unremarkable.  Left frontal/supraorbital scalp hematoma extending left periorbital and into nose.  No acute cervical spine abnormalities.   Electronically Signed   By: Ulyses Southward M.D.   On: 08/15/2013 01:32   US Renal  08/15/2013   CLINICAL DATA:  79 year old female with accelerated hypertension  EXAM: RENAL/URINARY TRACT ULTRASOUND COMPLETE  COMPARISON:  12/09/2004 renal ultrasound and 08/14/2011 CT  FINDINGS: Right Kidney:  Length: 9.5 cm. Echogenicity within normal limits. No mass or hydronephrosis visualized.  Left Kidney:  Length: 8.5 cm. Echogenicity within normal limits. No mass or hydronephrosis visualized.  Bladder:  Appears normal for  degree of bladder distention.  IMPRESSION: Slightly small kidneys - otherwise unremarkable exam. No  evidence of hydronephrosis.   Electronically Signed   By: Laveda Abbe M.D.   On: 08/15/2013 20:00   Dg Chest Port 1 View  08/15/2013   CLINICAL DATA:  Hypertensive and tachycardic after fall.  Chest pain  EXAM: PORTABLE CHEST - 1 VIEW  COMPARISON:  None.  FINDINGS: No cardiomegaly. Lower mediastinal gas filled mass consistent with hiatal hernia. No evidence of fracture. No effusion or pneumothorax. No edema or infiltrate.  IMPRESSION: No active disease.   Electronically Signed   By: Tiburcio Pea M.D.   On: 08/15/2013 01:15   Ct Maxillofacial Wo Cm  08/15/2013   CLINICAL DATA:  Larey Seat last week with injury to nose in right eye, fell tonight injuring left forehead/supraorbital region, history Menieres disease  EXAM: CT HEAD WITHOUT CONTRAST  CT MAXILLOFACIAL WITHOUT CONTRAST  CT CERVICAL SPINE WITHOUT CONTRAST  TECHNIQUE: Multidetector CT imaging of the head, cervical spine, and maxillofacial structures were performed using the standard protocol without intravenous contrast. Multiplanar CT image reconstructions of the cervical spine and maxillofacial structures were also generated. Right side of face marked with a BB.  COMPARISON:  CT head 07/18/2011  FINDINGS: CT HEAD FINDINGS  Generalized atrophy.  Normal ventricular morphology.  No midline shift or mass effect.  Small vessel chronic ischemic changes of deep cerebral white matter.  No intracranial hemorrhage, mass lesion, or acute infarction.  Visualized paranasal sinuses and mastoid air cells clear.  Bones unremarkable.  Left supraorbital/frontal scalp hematoma.  CT MAXILLOFACIAL FINDINGS  Left supraorbital and frontal scalp hematoma extending left periorbital and into nose.  Intraorbital soft tissue planes clear.  Visualized intracranial structures unremarkable.  Bones appear mildly demineralized.  Patient edentulous.  Paranasal sinuses, mastoid air cells and middle ear cavities clear.  Minimally displaced left nasal bone fracture identified.  No additional  facial bone fracture seen.  Slight nasal septal deviation to the left.  CT CERVICAL SPINE FINDINGS  Cervical soft tissues unremarkable.  Lung apices clear.  Visualized skullbase intact.  Scattered atherosclerotic calcifications of the carotid systems.  Prevertebral soft tissues normal thickness.  Minimal disc space narrowing C5-C6.  Vertebral body and disc space heights otherwise maintained.  Mild scattered facet degenerative changes.  No acute fracture, subluxation, or bone destruction.  IMPRESSION: Atrophy with small vessel chronic ischemic changes of deep cerebral white matter.  No acute intracranial abnormalities.  Minimally displaced left nasal bone fracture.  Remaining facial bones unremarkable.  Left frontal/supraorbital scalp hematoma extending left periorbital and into nose.  No acute cervical spine abnormalities.   Electronically Signed   By: Ulyses Southward M.D.   On: 08/15/2013 01:32   Echocardiogram pending Renal ultrasound initial handwritten note with no significant abnormalities Carotid Doppler with no definite stenosis-official report pending Medications: Scheduled: . amLODipine  5 mg Oral Daily  . escitalopram  10 mg Oral Daily  . HYDROcodone-acetaminophen  1 tablet Oral TID PC  . methocarbamol  500 mg Oral TID  . pantoprazole  40 mg Oral Daily   Continuous:   Assessment/Plan: Active Problems:   Syncope-unclear etiology but recurrent, complicated by multiple comorbidities including frail status, fibromyalgia, and arthritis, head CT unremarkable except for small vessel disease, echocardiogram pending, carotid Dopplers initially unremarkable, renal ultrasound initially unremarkable, initial troponin negative, will check orthostatics   Accelerated hypertension-improved, we'll continue current medications, may need to increase oral with calcium channel,    Facial contusion-clinically stable, with extensive bruising,  will need to watch, stents of hematoma    Unstable gait-unsteady will  check orthostatics, physical therapy recommending rehabilitation, occupational therapy consult pending   Depression-extensive, with history of anxiety, appears stable on current medications with support from family Atypical chest pain, we'll defer to outpatient evaluation, currently asymptomatic, echocardiogram pending Disposition-extensive discussion with patient and daughter, very resistant to rehabilitation stay, clearly wants to be discharged home with home health if necessary, family present 24 hours a day per daughter's report, patient has not been using a walker secondary to weakness, question modification of durable medical equipment. Will wait for evaluation of occupational therapy and followup with physical therapy.     LOS: 1 day   Nicole Key 08/16/2013, 11:24 AM

## 2013-08-17 ENCOUNTER — Encounter (HOSPITAL_COMMUNITY): Payer: Self-pay | Admitting: Radiology

## 2013-08-17 ENCOUNTER — Inpatient Hospital Stay (HOSPITAL_COMMUNITY): Payer: Medicare Other

## 2013-08-17 LAB — CBC
HCT: 37.1 % (ref 36.0–46.0)
Hemoglobin: 12.4 g/dL (ref 12.0–15.0)
MCHC: 33.4 g/dL (ref 30.0–36.0)
MCV: 93.2 fL (ref 78.0–100.0)
RBC: 3.98 MIL/uL (ref 3.87–5.11)
RDW: 15.4 % (ref 11.5–15.5)
WBC: 9.4 10*3/uL (ref 4.0–10.5)

## 2013-08-17 LAB — BASIC METABOLIC PANEL
BUN: 12 mg/dL (ref 6–23)
Chloride: 110 mEq/L (ref 96–112)
Creatinine, Ser: 0.61 mg/dL (ref 0.50–1.10)
GFR calc Af Amer: >90 mL/min (ref >90)
Glucose, Bld: 107 mg/dL — ABNORMAL HIGH (ref 70–99)

## 2013-08-17 MED ORDER — SODIUM CHLORIDE 0.9 % IV SOLN
INTRAVENOUS | Status: DC
  Administered 2013-08-18 – 2013-08-19 (×3): via INTRAVENOUS

## 2013-08-17 NOTE — Progress Notes (Signed)
Subjective: Patient alert, appropriate, answering questions, continues to have some headaches that are intermittent, partially relieved with pain medications however possibly worse compared to the last 24 hours, denies nausea overtly, did eat some breakfast, but has had some episodes that are transient. Denies any other focal neurologic deficits, continues to have facial pain. continues to have significant bruising on the face, with difficulty moving some the facial muscles, appetite somewhat suppressed, but denies nausea, vomiting, chest pain, shortness of breath at this time, denies abdominal discomfort, daughter present, very supportive, still very weak, almost fell traveling to the bathroom.  Objective: Vital signs in last 24 hours: Temp:  [98 F (36.7 C)-98.1 F (36.7 C)] 98 F (36.7 C) (12/20 2154) Pulse Rate:  [86-122] 86 (12/20 2154) Resp:  [20] 20 (12/20 2154) BP: (70-135)/(22-74) 135/61 mmHg (12/20 2154) SpO2:  [94 %-99 %] 99 % (12/20 2154) Weight change:  Last BM Date: 08/14/13  Lying 123/74 at 95 Sitting 116/62 at 102 Standing 70/22 at 122   CBG (last 3)  No results found for this basename: GLUCAP,  in the last 72 hours  Intake/Output from previous day: 12/20 0701 - 12/21 0700 In: 120 [P.O.:120] Out: -  Intake/Output this shift:   General appearance-frail, thin, cachectic, with significant extensive frontal facial bruising, below bilateral orbits with abrasion on the bridge of the nose. Sclera anicteric, no difficulties with extraocular movements, face is otherwise symmetric except for bruising and swelling. No oropharyngeal lesions, able to converse without hindrance Neck supple, no cervical lymphadenopathy, motion intact, no tenderness to palpation of the cervical spine Lungs clear to auscultation bilaterally with no respiratory distress Cardio vascular reveals distant heart sounds with regular rate Abdomen-soft, nontender, nondistended, nontender X line extremities no  significant edema, reasoning present, no cyanosis but diminished pulses Alert and oriented x3, conversant, no resting tremors, light touch intact, can move all 4 extremities, gait not tested  Lab Results:  Recent Labs  08/16/13 0632 08/17/13 0546  NA 139 144  K 3.6 3.9  CL 104 110  CO2 25 27  GLUCOSE 108* 107*  BUN 11 12  CREATININE 0.66 0.61  CALCIUM 8.9 8.7    Recent Labs  08/16/13 0632  AST 12  ALT 8  ALKPHOS 60  BILITOT 0.4  PROT 6.5  ALBUMIN 3.1*    Recent Labs  08/15/13 0029 08/16/13 0632 08/17/13 0546  WBC 16.1* 9.2 9.4  NEUTROABS 12.8*  --   --   HGB 15.2* 12.8 12.4  HCT 44.2 38.6 37.1  MCV 91.1 92.6 93.2  PLT 352 291 273    Recent Labs  08/15/13 0029  TROPONINI <0.30   No results found for this basename: TSH, T4TOTAL, FREET3, T3FREE, THYROIDAB,  in the last 72 hours No results found for this basename: VITAMINB12, FOLATE, FERRITIN, TIBC, IRON, RETICCTPCT,  in the last 72 hours  Studies/Results: US Renal  08/15/2013   CLINICAL DATA:  79 year old female with accelerated hypertension  EXAM: RENAL/URINARY TRACT ULTRASOUND COMPLETE  COMPARISON:  12/09/2004 renal ultrasound and 08/14/2011 CT  FINDINGS: Right Kidney:  Length: 9.5 cm. Echogenicity within normal limits. No mass or hydronephrosis visualized.  Left Kidney:  Length: 8.5 cm. Echogenicity within normal limits. No mass or hydronephrosis visualized.  Bladder:  Appears normal for degree of bladder distention.  IMPRESSION: Slightly small kidneys - otherwise unremarkable exam. No evidence of hydronephrosis.   Electronically Signed   By: Laveda Abbe M.D.   On: 08/15/2013 20:00   Echocardiogram pending Carotid Doppler with no  definite stenosis-official report pending Medications: Scheduled: . amLODipine  5 mg Oral Daily  . escitalopram  10 mg Oral Daily  . HYDROcodone-acetaminophen  1 tablet Oral TID PC  . methocarbamol  500 mg Oral TID  . pantoprazole  40 mg Oral Daily   Continuous:    Assessment/Plan: Active Problems:   Syncope-unclear etiology but recurrent, complicated by multiple comorbidities including frail status, fibromyalgia, and arthritis, initial head CT unremarkable except for small vessel disease, echocardiogram pending, carotid Dopplers  unremarkable, renal ultrasound  unremarkable, initial troponin negative, however orthostatic significant, start IV fluids, check ACTH and cortisol, initiate hydrocortisone 50 Q6 empirically, based on labs does not appearhypovolemia   Accelerated hypertension-improved, we'll continue current medications, will  need to monitor on hydrocortisone and IV fluid  Facial contusion-clinically stable, with extensive bruising, will need to watch, stents of hematoma Worsening headache, episodic, given significant facial contusion, nausea, which could be attributable to adrenal insufficiency we'll check head CT to rule out evolution of a post traumatic bleed   Unstable gait-significantly orthostatic, hopefully will improve with therapy   Depression-extensive, with history of anxiety, appears stable on current medications with support from family Atypical chest pain, we'll defer to outpatient evaluation, currently asymptomatic, echocardiogram pending Disposition-extensive discussion with patient and daughter, very resistant to rehabilitation stay, clearly wants to be discharged home with home health if necessary, family present 24 hours a day per daughter's report, patient has not been using a walker secondary to weakness, question modification of durable medical equipment. Will wait for evaluation of occupational therapy and followup with physical therapy.     LOS: 2 days   Esequiel Kleinfelter R 08/17/2013, 9:01 AM

## 2013-08-18 ENCOUNTER — Inpatient Hospital Stay (HOSPITAL_COMMUNITY): Payer: Medicare Other

## 2013-08-18 LAB — COMPREHENSIVE METABOLIC PANEL
Albumin: 2.7 g/dL — ABNORMAL LOW (ref 3.5–5.2)
Alkaline Phosphatase: 57 U/L (ref 39–117)
BUN: 10 mg/dL (ref 6–23)
Calcium: 8.4 mg/dL (ref 8.4–10.5)
Chloride: 104 mEq/L (ref 96–112)
Creatinine, Ser: 0.55 mg/dL (ref 0.50–1.10)
GFR calc Af Amer: >90 mL/min (ref >90)
GFR calc non Af Amer: 84 mL/min — ABNORMAL LOW (ref >90)
Glucose, Bld: 131 mg/dL — ABNORMAL HIGH (ref 70–99)
Total Bilirubin: 0.4 mg/dL (ref 0.3–1.2)

## 2013-08-18 LAB — CBC
Hemoglobin: 12.1 g/dL (ref 12.0–15.0)
Hemoglobin: 12.4 g/dL (ref 12.0–15.0)
MCH: 31.3 pg (ref 26.0–34.0)
MCV: 93.3 fL (ref 78.0–100.0)
Platelets: 284 10*3/uL (ref 150–400)
Platelets: 307 10*3/uL (ref 150–400)
RBC: 3.89 MIL/uL (ref 3.87–5.11)
RBC: 3.96 MIL/uL (ref 3.87–5.11)
RDW: 15.3 % (ref 11.5–15.5)
WBC: 17.2 10*3/uL — ABNORMAL HIGH (ref 4.0–10.5)

## 2013-08-18 LAB — URINALYSIS, ROUTINE W REFLEX MICROSCOPIC
Nitrite: NEGATIVE
Specific Gravity, Urine: 1.008 (ref 1.005–1.030)
Urobilinogen, UA: 0.2 mg/dL (ref 0.0–1.0)
pH: 5.5 (ref 5.0–8.0)

## 2013-08-18 LAB — URINE MICROSCOPIC-ADD ON

## 2013-08-18 MED ORDER — AMLODIPINE BESYLATE 5 MG PO TABS: 5.0000 mg | ORAL_TABLET | Freq: Every day | ORAL | Status: DC

## 2013-08-18 NOTE — Progress Notes (Signed)
Subjective: Patient is frail this morning and we had intended on discharge the white count has shot up to 17,000.  We encouraged aggressively skilled nursing she refuses.  She does have a moist cough this is somewhat chronic but increased.  Objective: Vital signs in last 24 hours: Temp:  [98.9 F (37.2 C)] 98.9 F (37.2 C) (12/22 0620) Pulse Rate:  [102-117] 117 (12/22 0622) Resp:  [20] 20 (12/22 0620) BP: (99-143)/(64-76) 100/64 mmHg (12/22 1005) SpO2:  [94 %] 94 % (12/22 0620) Weight change:   CBG (last 3)  No results found for this basename: GLUCAP,  in the last 72 hours  Intake/Output from previous day: 12/21 0701 - 12/22 0700 In: 720 [P.O.:720] Out: -   Physical Exam: Frail with extensive ecchymoses bitemporal wasting.  Rhonchi bilaterally.  No JVD or bruits.  Heart sounds distant.  Abdomen is benign   Lab Results:  Recent Labs  08/17/13 0546 08/18/13 0522  NA 144 137  K 3.9 3.4*  CL 110 104  CO2 27 23  GLUCOSE 107* 131*  BUN 12 10  CREATININE 0.61 0.55  CALCIUM 8.7 8.4    Recent Labs  08/16/13 0632 08/18/13 0522  AST 12 10  ALT 8 7  ALKPHOS 60 57  BILITOT 0.4 0.4  PROT 6.5 6.0  ALBUMIN 3.1* 2.7*    Recent Labs  08/17/13 0546 08/18/13 0522  WBC 9.4 17.2*  HGB 12.4 12.1  HCT 37.1 36.3  MCV 93.2 93.3  PLT 273 284   Lab Results  Component Value Date   INR 0.95 08/15/2013   No results found for this basename: CKTOTAL, CKMB, CKMBINDEX, TROPONINI,  in the last 72 hours No results found for this basename: TSH, T4TOTAL, FREET3, T3FREE, THYROIDAB,  in the last 72 hours No results found for this basename: VITAMINB12, FOLATE, FERRITIN, TIBC, IRON, RETICCTPCT,  in the last 72 hours  Studies/Results: Ct Head Wo Contrast  08/17/2013   CLINICAL DATA:  Headache, worsening facial bruising  EXAM: CT HEAD WITHOUT CONTRAST  TECHNIQUE: Contiguous axial images were obtained from the base of the skull through the vertex without intravenous contrast.   COMPARISON:  08/15/2013  FINDINGS: No evidence of parenchymal hemorrhage or extra-axial fluid collection. No mass lesion, mass effect, or midline shift.  No CT evidence of acute infarction.  Extensive subcortical white matter and periventricular small vessel ischemic changes. Intracranial atherosclerosis.  Mild global cortical atrophy.  No ventriculomegaly.  The visualized paranasal sinuses are essentially clear. The mastoid air cells are unopacified.  Extracranial hematoma overlying the left frontal bone/orbit, measuring approximately 9 x 28 mm, previously 11 x 29 mm.  Underlying left orbit, including the globe and retroconal soft tissues, are within normal limits.  Nondisplaced left nasal bone fracture (series 3/image 5). No evidence of calvarial fracture.  IMPRESSION: Extracranial hematoma overlying the left frontal bone/orbit, grossly unchanged.  Nondisplaced left nasal bone fracture.  No evidence of acute intracranial abnormality.   Electronically Signed   By: Charline Bills M.D.   On: 08/17/2013 15:43     Assessment/Plan: #1 new elevated white blood cell count will discontinue discharge to get stat CBC, urine and chest x-ray #2 syncope no unclear etiology cardiac and neuro workup negative.  Cortisol is normal certainly doesn't appear adrenally insufficient #3 multiple contusions and facial nose fracture all stable #4 gait instability multifactorial summer which is chronic refuses placement high risk discussed this with family #5 depression stable  We will cancel the discharge today reevaluate the white  count or the pending the return today   LOS: 3 days   Marcee Jacobs A 08/18/2013, 1:55 PM

## 2013-08-18 NOTE — Progress Notes (Signed)
   CARE MANAGEMENT NOTE 08/18/2013  Patient:  Nicole Key, Nicole Key   Account Number:  [id number]  Date Initiated:  08/15/2013  Documentation initiated by:  Lanier Clam  Subjective/Objective Assessment:   SYNCOPE     Action/Plan:   FROM HOME   Anticipated DC Date:  08/18/2013   Anticipated DC Plan:  HOME W HOME HEALTH SERVICES      DC Planning Services  CM consult      Choice offered to / List presented to:  C-1 Patient           Status of service:  In process, will continue to follow Medicare Important Message given?   (If response is "NO", the following Medicare IM given date fields will be blank) Date Medicare IM given:   Date Additional Medicare IM given:    Discharge Disposition:    Per UR Regulation:  Reviewed for med. necessity/level of care/duration of stay  If discussed at Long Length of Stay Meetings, dates discussed:    Comments:  08/18/13 Nicole Iseminger RN,BSN NCM [phone] PATIENT PLEASANTLY DECLINES SNF.SPOKE TO PATIENT/DTR, WHILE NURSE IN RM TO CONFIRM D/C PLANS.HIGHLY ENCOURAGED PT RECOMMENDATIONS OF SNF, & BENEFITS,BUT PATIENT DECLINES SNF.HHC LIST PROVIDED,EXPLAINED THE INTERMITTENT SKILLED SERVICES,& ALSO DIFFERENCE BETWEEN PRIVATE DUTY CARE SERVICES-CUSTODIAL,INDEPENDENT DECISION,& OUT OF POCKET COST.PATIENT/DTR BOTH VOICED UNDERSTANDING.[facility] CHOSEN FOR HHC.RECOMMEND HHRN/PT/OT/SW/NURSE'S AIDE.TC [facility] REP STEPHANIE INFORMED OF REFERRAL & POSSIBLE D/C.AWAIT FINAL HHC ORDERS,& FACE TO FACE.[facility] ALL DME-CANE,RW,3N1.  Nicole Key RNBSN NCM AWAIT PT OT RECOMMENDATIONS

## 2013-08-18 NOTE — Progress Notes (Signed)
Clinical Social Work Department BRIEF PSYCHOSOCIAL ASSESSMENT 08/18/2013  Patient:  Nicole Key, Nicole Key     Account Number:  [id number]     Admit date:  08/14/2013  Clinical Social Worker:  Hattie Perch  Date/Time:  08/18/2013 12:00 M  Referred by:  Physician  Date Referred:  08/18/2013 Referred for  SNF Placement   Other Referral:   Interview type:  Patient Other interview type:   daughter at bedside.    PSYCHOSOCIAL DATA Living Status:  FAMILY Admitted from facility:   Level of care:   Primary support name:  Nicole Key Primary support relationship to patient:  CHILD, ADULT Degree of support available:   good    CURRENT CONCERNS Current Concerns  Post-Acute Placement   Other Concerns:    SOCIAL WORK ASSESSMENT / PLAN CSW met with patient and daughter at bedside. Patient is alert and oriented X3.CSW spoke with them about the possibility of snf placement. both patient and daughter adamantly refuse snf placement. They are, however, agreeable to home health. CSW discussed risks and benefits of going home seeing as how patient has severe bruising to her face. They state that they have all discussed it and patient will go home.   Assessment/plan status:   Other assessment/ plan:   Information/referral to community resources:    PATIENT'S/FAMILY'S RESPONSE TO PLAN OF CARE: family is adamant that they will care for patient at home. Patient has great support system but family appears to be a little unrealistic about patients capabilities. CSW signing off.

## 2013-08-18 NOTE — Progress Notes (Signed)
Spoke with MD will cancel discharge due to increased WBC's... Pt and family receptive to change in plan of care. Orders obtained and initiated.

## 2013-08-18 NOTE — Progress Notes (Signed)
Physical Therapy Treatment Patient Details Name: Nicole Key MRN: [id number] DOB: 08/15/1928 Today's Date: 08/18/2013 Time: 6962-9528 PT Time Calculation (min): 13 min  PT Assessment / Plan / Recommendation  History of Present Illness 79 yo female admitted with syncope, fall, hematoma, nasal bone fx, hypertension, weakness. Hx of Meniere's disease, athritis.    PT Comments   Pt unable to tolerate sitting EOB and declined further mobility due to dizziness.  Also discussed possible vertigo involvement (?BPPV) since pt states she gets dizzy with turning in bed as well and did fall prior to arrival hitting head however would be difficult to assess currently due to hx of Meniere's and currently with orthostatic hypotension.  Per CSW note, pt and family decline SNF so plan is for home with HHPT.   Follow Up Recommendations  Home health PT;Supervision/Assistance - 24 hour     Does the patient have the potential to tolerate intense rehabilitation     Barriers to Discharge        Equipment Recommendations  None recommended by PT    Recommendations for Other Services    Frequency Min 3X/week   Progress towards PT Goals Progress towards PT goals: Not progressing toward goals - comment (dizziness limiting mobility)  Plan Current plan remains appropriate    Precautions / Restrictions Precautions Precautions: Fall Precaution Comments: dizziness   Pertinent Vitals/Pain Orthostatic hypotension per docflowsheets this AM    Mobility  Bed Mobility Bed Mobility: Supine to Sit;Sit to Supine Supine to Sit: HOB elevated;5: Supervision Sit to Supine: HOB elevated;5: Supervision Details for Bed Mobility Assistance: Increased time, pt stated she wished to attempt transfers herself and would request assist if needed, had pt performed a couple exercises with UE to improve BP (orthostatic this morning per docflowsheets) Sat EOB for approx 3 minutes before lying back down - "I gotta lie down.  Wooziness is too muchPublic librarian for Transfer Assistance: Pt declined to attempt due to dizziness Ambulation/Gait Ambulation/Gait Assistance: Not tested (comment)    Exercises     PT Diagnosis:    PT Problem List:   PT Treatment Interventions:     PT Goals (current goals can now be found in the care plan section)    Visit Information  Last PT Received On: 08/18/13 Assistance Needed: +2 (if OOB) History of Present Illness: 79 yo female admitted with syncope, fall, hematoma, nasal bone fx, hypertension, weakness. Hx of Meniere's disease, athritis.     Subjective Data      Cognition  Cognition Arousal/Alertness: Awake/alert Behavior During Therapy: WFL for tasks assessed/performed Overall Cognitive Status: Within Functional Limits for tasks assessed    Balance     End of Session PT - End of Session Activity Tolerance:  (limited by dizziness) Patient left: in bed;with call bell/phone within reach;with bed alarm set;with family/visitor present   GP     Axcel Horsch,KATHrine E 08/18/2013, 12:45 PM Zenovia Jarred, PT, DPT 08/18/2013 Pager: [phone]

## 2013-08-18 NOTE — Progress Notes (Signed)
OT Cancellation Note  Patient Details Name: Nicole Key MRN: [id number] DOB: 06-19-1928   Cancelled Treatment:    Reason Eval/Treat Not Completed: Patient at procedure or test/ unavailable. Will recheck on pt next day. Thanks,  Alba Cory 08/18/2013, 2:24 PM

## 2013-08-19 LAB — CBC
HCT: 32.9 % — ABNORMAL LOW (ref 36.0–46.0)
MCH: 30.7 pg (ref 26.0–34.0)
MCHC: 32.8 g/dL (ref 30.0–36.0)
MCV: 93.5 fL (ref 78.0–100.0)
Platelets: 265 10*3/uL (ref 150–400)
RBC: 3.52 MIL/uL — ABNORMAL LOW (ref 3.87–5.11)
WBC: 7.3 10*3/uL (ref 4.0–10.5)

## 2013-08-19 LAB — COMPREHENSIVE METABOLIC PANEL
AST: 8 U/L (ref 0–37)
Albumin: 2.5 g/dL — ABNORMAL LOW (ref 3.5–5.2)
Alkaline Phosphatase: 48 U/L (ref 39–117)
BUN: 8 mg/dL (ref 6–23)
Chloride: 107 mEq/L (ref 96–112)
Creatinine, Ser: 0.54 mg/dL (ref 0.50–1.10)
Potassium: 3.3 mEq/L — ABNORMAL LOW (ref 3.5–5.1)
Total Bilirubin: 0.2 mg/dL — ABNORMAL LOW (ref 0.3–1.2)
Total Protein: 5.7 g/dL — ABNORMAL LOW (ref 6.0–8.3)

## 2013-08-19 NOTE — Progress Notes (Signed)
Came to visit patient at bedside to offer [facility] Care Management services. Spoke with daughter who states they would be agreeable to [facility] Care Management services. Consents were signed. Will receive post hospital discharge call and will be evaluated for monthly home visits. Explained these services will not interfere with home health. Made inpatient RNCM aware that [facility] Care Management will follow. Raiford Noble, MSN-Ed, RN,BSN- [facility] [phone]

## 2013-08-19 NOTE — Discharge Summary (Signed)
DISCHARGE SUMMARY  Nicole Key  MR#: [id number]  DOB:06/26/1928  Date of Admission: 08/15/2013 Date of Discharge: 08/19/2013  Attending Physician:Faizon Capozzi A  Patient's WUJ:WJXBJYN,WGNFAOZ A, MD  Consults: none  Discharge Diagnoses: Active Problems:   Syncope   Accelerated hypertension   Facial contusion   Unstable gait   Depression   Discharge Medications:   Medication List    STOP taking these medications       diphenhydrAMINE 25 MG tablet  Commonly known as:  BENADRYL     methocarbamol 500 MG tablet  Commonly known as:  ROBAXIN     ranitidine 150 MG tablet  Commonly known as:  ZANTAC      TAKE these medications       ALPRAZolam 0.25 MG tablet  Commonly known as:  XANAX  Take 0.25 mg by mouth 2 (two) times daily as needed.     amLODipine 5 MG tablet  Commonly known as:  NORVASC  Take 1 tablet (5 mg total) by mouth daily.     escitalopram 10 MG tablet  Commonly known as:  LEXAPRO  Take 10 mg by mouth daily.     EXCEDRIN EXTRA STRENGTH 250-250-65 MG per tablet  Generic drug:  aspirin-acetaminophen-caffeine  Take 1 tablet by mouth every 6 (six) hours as needed. Used for pain     HYDROcodone-acetaminophen 5-325 MG per tablet  Commonly known as:  NORCO/VICODIN  Take 1 tablet by mouth 3 (three) times daily after meals.     omeprazole 20 MG capsule  Commonly known as:  PRILOSEC  Take 20 mg by mouth daily.     traMADol 50 MG tablet  Commonly known as:  ULTRAM  Take 50 mg by mouth every 6 (six) hours as needed. Maximum dose= 8 tablets per day as needed for pain     Vitamin D (Ergocalciferol) 50000 UNITS Caps capsule  Commonly known as:  DRISDOL  Take 50,000 Units by mouth. Takes one cap on Mon, Wed, and Fri for 2 months, then 1 cap a week     zolpidem 10 MG tablet  Commonly known as:  AMBIEN  Take 10 mg by mouth at bedtime as needed.        Hospital Procedures: Dg Chest 2 View  08/18/2013   CLINICAL DATA:  Elevated white blood  cell count  EXAM: CHEST  2 VIEW  COMPARISON:  08/15/2013  FINDINGS: Cardiac shadow is stable. A moderate hiatal hernia is again noted. Lungs are clear. No acute bony abnormality is seen.  IMPRESSION: No acute abnormality noted.   Electronically Signed   By: Alcide Clever M.D.   On: 08/18/2013 14:43   Ct Head Wo Contrast  08/17/2013   CLINICAL DATA:  Headache, worsening facial bruising  EXAM: CT HEAD WITHOUT CONTRAST  TECHNIQUE: Contiguous axial images were obtained from the base of the skull through the vertex without intravenous contrast.  COMPARISON:  08/15/2013  FINDINGS: No evidence of parenchymal hemorrhage or extra-axial fluid collection. No mass lesion, mass effect, or midline shift.  No CT evidence of acute infarction.  Extensive subcortical white matter and periventricular small vessel ischemic changes. Intracranial atherosclerosis.  Mild global cortical atrophy.  No ventriculomegaly.  The visualized paranasal sinuses are essentially clear. The mastoid air cells are unopacified.  Extracranial hematoma overlying the left frontal bone/orbit, measuring approximately 9 x 28 mm, previously 11 x 29 mm.  Underlying left orbit, including the globe and retroconal soft tissues, are within normal limits.  Nondisplaced left nasal bone fracture (  series 3/image 5). No evidence of calvarial fracture.  IMPRESSION: Extracranial hematoma overlying the left frontal bone/orbit, grossly unchanged.  Nondisplaced left nasal bone fracture.  No evidence of acute intracranial abnormality.   Electronically Signed   By: Charline Bills M.D.   On: 08/17/2013 15:43   Ct Head Wo Contrast  08/15/2013   CLINICAL DATA:  Larey Seat last week with injury to nose in right eye, fell tonight injuring left forehead/supraorbital region, history Menieres disease  EXAM: CT HEAD WITHOUT CONTRAST  CT MAXILLOFACIAL WITHOUT CONTRAST  CT CERVICAL SPINE WITHOUT CONTRAST  TECHNIQUE: Multidetector CT imaging of the head, cervical spine, and maxillofacial  structures were performed using the standard protocol without intravenous contrast. Multiplanar CT image reconstructions of the cervical spine and maxillofacial structures were also generated. Right side of face marked with a BB.  COMPARISON:  CT head 07/18/2011  FINDINGS: CT HEAD FINDINGS  Generalized atrophy.  Normal ventricular morphology.  No midline shift or mass effect.  Small vessel chronic ischemic changes of deep cerebral white matter.  No intracranial hemorrhage, mass lesion, or acute infarction.  Visualized paranasal sinuses and mastoid air cells clear.  Bones unremarkable.  Left supraorbital/frontal scalp hematoma.  CT MAXILLOFACIAL FINDINGS  Left supraorbital and frontal scalp hematoma extending left periorbital and into nose.  Intraorbital soft tissue planes clear.  Visualized intracranial structures unremarkable.  Bones appear mildly demineralized.  Patient edentulous.  Paranasal sinuses, mastoid air cells and middle ear cavities clear.  Minimally displaced left nasal bone fracture identified.  No additional facial bone fracture seen.  Slight nasal septal deviation to the left.  CT CERVICAL SPINE FINDINGS  Cervical soft tissues unremarkable.  Lung apices clear.  Visualized skullbase intact.  Scattered atherosclerotic calcifications of the carotid systems.  Prevertebral soft tissues normal thickness.  Minimal disc space narrowing C5-C6.  Vertebral body and disc space heights otherwise maintained.  Mild scattered facet degenerative changes.  No acute fracture, subluxation, or bone destruction.  IMPRESSION: Atrophy with small vessel chronic ischemic changes of deep cerebral white matter.  No acute intracranial abnormalities.  Minimally displaced left nasal bone fracture.  Remaining facial bones unremarkable.  Left frontal/supraorbital scalp hematoma extending left periorbital and into nose.  No acute cervical spine abnormalities.   Electronically Signed   By: Ulyses Southward M.D.   On: 08/15/2013 01:32    Ct Cervical Spine Wo Contrast  08/15/2013   CLINICAL DATA:  Larey Seat last week with injury to nose in right eye, fell tonight injuring left forehead/supraorbital region, history Menieres disease  EXAM: CT HEAD WITHOUT CONTRAST  CT MAXILLOFACIAL WITHOUT CONTRAST  CT CERVICAL SPINE WITHOUT CONTRAST  TECHNIQUE: Multidetector CT imaging of the head, cervical spine, and maxillofacial structures were performed using the standard protocol without intravenous contrast. Multiplanar CT image reconstructions of the cervical spine and maxillofacial structures were also generated. Right side of face marked with a BB.  COMPARISON:  CT head 07/18/2011  FINDINGS: CT HEAD FINDINGS  Generalized atrophy.  Normal ventricular morphology.  No midline shift or mass effect.  Small vessel chronic ischemic changes of deep cerebral white matter.  No intracranial hemorrhage, mass lesion, or acute infarction.  Visualized paranasal sinuses and mastoid air cells clear.  Bones unremarkable.  Left supraorbital/frontal scalp hematoma.  CT MAXILLOFACIAL FINDINGS  Left supraorbital and frontal scalp hematoma extending left periorbital and into nose.  Intraorbital soft tissue planes clear.  Visualized intracranial structures unremarkable.  Bones appear mildly demineralized.  Patient edentulous.  Paranasal sinuses, mastoid air cells and middle  ear cavities clear.  Minimally displaced left nasal bone fracture identified.  No additional facial bone fracture seen.  Slight nasal septal deviation to the left.  CT CERVICAL SPINE FINDINGS  Cervical soft tissues unremarkable.  Lung apices clear.  Visualized skullbase intact.  Scattered atherosclerotic calcifications of the carotid systems.  Prevertebral soft tissues normal thickness.  Minimal disc space narrowing C5-C6.  Vertebral body and disc space heights otherwise maintained.  Mild scattered facet degenerative changes.  No acute fracture, subluxation, or bone destruction.  IMPRESSION: Atrophy with small  vessel chronic ischemic changes of deep cerebral white matter.  No acute intracranial abnormalities.  Minimally displaced left nasal bone fracture.  Remaining facial bones unremarkable.  Left frontal/supraorbital scalp hematoma extending left periorbital and into nose.  No acute cervical spine abnormalities.   Electronically Signed   By: Ulyses Southward M.D.   On: 08/15/2013 01:32   US Renal  08/15/2013   CLINICAL DATA:  79 year old female with accelerated hypertension  EXAM: RENAL/URINARY TRACT ULTRASOUND COMPLETE  COMPARISON:  12/09/2004 renal ultrasound and 08/14/2011 CT  FINDINGS: Right Kidney:  Length: 9.5 cm. Echogenicity within normal limits. No mass or hydronephrosis visualized.  Left Kidney:  Length: 8.5 cm. Echogenicity within normal limits. No mass or hydronephrosis visualized.  Bladder:  Appears normal for degree of bladder distention.  IMPRESSION: Slightly small kidneys - otherwise unremarkable exam. No evidence of hydronephrosis.   Electronically Signed   By: Laveda Abbe M.D.   On: 08/15/2013 20:00   Dg Chest Port 1 View  08/15/2013   CLINICAL DATA:  Hypertensive and tachycardic after fall.  Chest pain  EXAM: PORTABLE CHEST - 1 VIEW  COMPARISON:  None.  FINDINGS: No cardiomegaly. Lower mediastinal gas filled mass consistent with hiatal hernia. No evidence of fracture. No effusion or pneumothorax. No edema or infiltrate.  IMPRESSION: No active disease.   Electronically Signed   By: Tiburcio Pea M.D.   On: 08/15/2013 01:15   Ct Maxillofacial Wo Cm  08/15/2013   CLINICAL DATA:  Larey Seat last week with injury to nose in right eye, fell tonight injuring left forehead/supraorbital region, history Menieres disease  EXAM: CT HEAD WITHOUT CONTRAST  CT MAXILLOFACIAL WITHOUT CONTRAST  CT CERVICAL SPINE WITHOUT CONTRAST  TECHNIQUE: Multidetector CT imaging of the head, cervical spine, and maxillofacial structures were performed using the standard protocol without intravenous contrast. Multiplanar CT image  reconstructions of the cervical spine and maxillofacial structures were also generated. Right side of face marked with a BB.  COMPARISON:  CT head 07/18/2011  FINDINGS: CT HEAD FINDINGS  Generalized atrophy.  Normal ventricular morphology.  No midline shift or mass effect.  Small vessel chronic ischemic changes of deep cerebral white matter.  No intracranial hemorrhage, mass lesion, or acute infarction.  Visualized paranasal sinuses and mastoid air cells clear.  Bones unremarkable.  Left supraorbital/frontal scalp hematoma.  CT MAXILLOFACIAL FINDINGS  Left supraorbital and frontal scalp hematoma extending left periorbital and into nose.  Intraorbital soft tissue planes clear.  Visualized intracranial structures unremarkable.  Bones appear mildly demineralized.  Patient edentulous.  Paranasal sinuses, mastoid air cells and middle ear cavities clear.  Minimally displaced left nasal bone fracture identified.  No additional facial bone fracture seen.  Slight nasal septal deviation to the left.  CT CERVICAL SPINE FINDINGS  Cervical soft tissues unremarkable.  Lung apices clear.  Visualized skullbase intact.  Scattered atherosclerotic calcifications of the carotid systems.  Prevertebral soft tissues normal thickness.  Minimal disc space narrowing C5-C6.  Vertebral  body and disc space heights otherwise maintained.  Mild scattered facet degenerative changes.  No acute fracture, subluxation, or bone destruction.  IMPRESSION: Atrophy with small vessel chronic ischemic changes of deep cerebral white matter.  No acute intracranial abnormalities.  Minimally displaced left nasal bone fracture.  Remaining facial bones unremarkable.  Left frontal/supraorbital scalp hematoma extending left periorbital and into nose.  No acute cervical spine abnormalities.   Electronically Signed   By: Ulyses Southward M.D.   On: 08/15/2013 01:32    History of Present Illness:  Is an 79 year old white female who presents after a fall at home. This is  the third of 3 falls occurring over the last 3 months. This occurred when she got up from her bed to try to go to the bathroom. She had 2 failed attempts to get up off the bed and fell back onto the bed. No loss of consciousness noted. The third time she was able to get off the bed and travel several feet until the point she collapsed and hit her face on the chair. She denies loss of consciousness. She notes her legs have been giving her a lot of trouble with pain and weakness. Prior to this there was no prodrome, dizziness, change in vision, or chest pain. The other episodes were somewhat similar. She's been much less mobile in the last several weeks due to these problems. In general she's had a variety of issues. She's had some chest pain on-and-off but a bit vague and she blamed on age. There is no associated shortness of breath nausea or radiation of the pain. Her breathing has been doing okay. She's had no infectious symptoms in several days. She's been eating and drinking fairly well. Her weight is stable. She has chronic neck pain on the left side but got a bit worse. She's been started on no new medications. An earlier fall apparently led to what appears to be a nasal fracture. She's been having pain in the frontal area and spreading out across the parietal area. This is different than her usual headaches. Her vision has been stable. She has periods joint complaints. Her bowels are doing well. Her depressions doing well.     Hospital Course: Patient admitted and monitored.  Echo, carotids, cns imaging without inciting etiology--may have been orthostatic or vasovagal.  Patient was labile with regards to her blood pressure and antihypertensives were adjusted with a delicate balance between orthostasis and blunting her upper response. We did look at adrenal insufficiency but cortisol level was not even suggestive. We did order OT and PT and were concerned about safety discharge but patient and daughter  adamantly refused a course of therapy either at inpatient rehabilitation or skilled nursing. I did discuss the fact that she is high risk for fall and injury and have initiated home health and [facility] for home support. She did have a spike in her white blood cell count on the day of intended discharge with subsequent urinalysis and chest x-ray negative. Repeat white blood cell count was normal indicating this may indeed been just a lab error. On discharge she does remain with significant facial bruising in his bit frail and deconditioned but this is close to her baseline. I did explain that she is high risk for falls based upon orthostasis, vestibular symptoms which are chronic, gait instability and osteoarthritis. She is discharged in stable condition again refusing placement.  Day of Discharge Exam BP 143/57  Pulse 76  Temp(Src) 98.3 F (36.8  C) (Oral)  Resp 18  Ht 5\' 7"  (1.702 m)  Wt 53.071 kg (117 lb)  BMI 18.32 kg/m2  SpO2 92%  Physical Exam: General appearance: alert, cooperative and no distress, significant ecchymoses about the face following a fall Eyes: no scleral icterus Throat: oropharynx moist without erythema Resp: clear to auscultation bilaterally Cardio: regular rate and rhythm, S1, S2 normal, no murmur, click, rub or gallop Extremities: no clubbing, cyanosis or edema Neurologically patient higher cortical function is intact she is diffusely weak but nonlateralizing.  Discharge Labs:  Recent Labs  08/18/13 0522 08/19/13 0550  NA 137 141  K 3.4* 3.3*  CL 104 107  CO2 23 25  GLUCOSE 131* 95  BUN 10 8  CREATININE 0.55 0.54  CALCIUM 8.4 8.2*    Recent Labs  08/18/13 0522 08/19/13 0550  AST 10 8  ALT 7 6  ALKPHOS 57 48  BILITOT 0.4 0.2*  PROT 6.0 5.7*  ALBUMIN 2.7* 2.5*    Recent Labs  08/18/13 1440 08/19/13 0550  WBC 11.6* 7.3  HGB 12.4 10.8*  HCT 36.9 32.9*  MCV 93.2 93.5  PLT 307 265   No results found for this basename:  CKTOTAL, CKMB, CKMBINDEX, TROPONINI,  in the last 72 hours No results found for this basename: TSH, T4TOTAL, FREET3, T3FREE, THYROIDAB,  in the last 72 hours No results found for this basename: VITAMINB12, FOLATE, FERRITIN, TIBC, IRON, RETICCTPCT,  in the last 72 hours  Discharge instructions:     Discharge Orders   Future Orders Complete By Expires   Diet - low sodium heart healthy  As directed    Diet - low sodium heart healthy  As directed    Increase activity slowly  As directed    Increase activity slowly  As directed       Disposition: Home with daughter and round-the-clock care by family  Follow-up Appts: Follow-up with Dr. Jacky Kindle at [facility] in 1-2 weeks.  Call for appointment.  Condition on Discharge: Improved and stable condition  Tests Needing Follow-up: None  Signed: Early Ord A 08/19/2013, 11:12 AM

## 2013-08-19 NOTE — Evaluation (Signed)
Occupational Therapy Evaluation Patient Details Name: Nicole Key MRN: [id number] DOB: 12/12/1927 Today's Date: 08/19/2013 Time: 9629-5284 OT Time Calculation (min): 16 min  OT Assessment / Plan / Recommendation History of present illness 79 yo female admitted with syncope, fall, hematoma, nasal bone fx, hypertension, weakness. Hx of Meniere's disease, athritis.    Clinical Impression   Pt was admitted with the above.  She c/o lightheadedness after transfer to commode and resolved when back to bed:  Did not have dynamap to check BP.  Pt will have 24/7.  Recommend HHOT to regain independence.    OT Assessment  Patient needs continued OT Services    Follow Up Recommendations  Home health OT;Supervision/Assistance - 24 hour    Barriers to Discharge      Equipment Recommendations  3 in 1 bedside comode (please discuss if copay or cost)    Recommendations for Other Services    Frequency  Min 2X/week    Precautions / Restrictions Precautions Precaution Comments: dizziness Restrictions Weight Bearing Restrictions: No   Pertinent Vitals/Pain No c/o pain.  Had lightheadedness during eval    ADL  Grooming: Set up Where Assessed - Grooming: Supine, head of bed up Upper Body Bathing: Set up Where Assessed - Upper Body Bathing: Supine, head of bed up Lower Body Bathing: Moderate assistance Where Assessed - Lower Body Bathing: Supine, head of bed flat;Rolling right and/or left Upper Body Dressing: Minimal assistance Where Assessed - Upper Body Dressing: Supine, head of bed up Lower Body Dressing: Maximal assistance Where Assessed - Lower Body Dressing: Supine, head of bed up Toilet Transfer: Minimal assistance Toilet Transfer Method: Sit to stand Toilet Transfer Equipment: Bedside commode Toileting - Clothing Manipulation and Hygiene: Maximal assistance Where Assessed - Toileting Clothing Manipulation and Hygiene: Sit to stand from 3-in-1 or toilet Transfers/Ambulation  Related to ADLs: spt to bedside commode ADL Comments: Pt was calling for assist to use 3:1.  Pt felt lightheaded.  did not have dynamap to take BP.  Daughter wants to take pt home and grandson is there.  She is concerned about pt's privacy when using commodel  Recommended she put robe or gown over clothes:  grandson can steady pt and daughter can pull pants up as daughter cannot lift her.  Discussed balancing activity for energy conservation   OT Diagnosis: Generalized weakness  OT Problem List: Decreased strength;Decreased activity tolerance;Impaired balance (sitting and/or standing);Decreased knowledge of use of DME or AE OT Treatment Interventions: Self-care/ADL training;Energy conservation;Patient/family education;Balance training;Therapeutic activities;DME and/or AE instruction   OT Goals(Current goals can be found in the care plan section) Acute Rehab OT Goals Patient Stated Goal: no more falls OT Goal Formulation: With patient Time For Goal Achievement: 09/03/12 Potential to Achieve Goals: Good  Visit Information  Last OT Received On: 08/19/13 Assistance Needed: +1 (SPT) History of Present Illness: 79 yo female admitted with syncope, fall, hematoma, nasal bone fx, hypertension, weakness. Hx of Meniere's disease, athritis.        Prior Functioning     Home Living Family/patient expects to be discharged to:: Private residence Living Arrangements: Children;Other (Comment) Home Equipment: Bedside commode Additional Comments: pt has been sponge bathing Prior Function Level of Independence: Needs assistance Communication Communication: No difficulties         Vision/Perception     Cognition  Cognition Arousal/Alertness: Awake/alert Behavior During Therapy: WFL for tasks assessed/performed Overall Cognitive Status: Within Functional Limits for tasks assessed    Extremity/Trunk Assessment Upper Extremity Assessment Upper Extremity Assessment: Generalized  weakness      Mobility Transfers Transfers: Sit to Stand;Stand to Sit Sit to Stand: 4: Min assist;From bed;From chair/3-in-1;With upper extremity assist Stand to Sit: 4: Min assist;To bed;To chair/3-in-1     Exercise     Balance     End of Session OT - End of Session Activity Tolerance: Patient limited by fatigue Patient left: in bed;with call bell/phone within reach  GO     [facility] 08/19/2013, 11:59 AM Marica Otter, OTR/L [phone] 08/19/2013

## 2013-12-17 ENCOUNTER — Encounter (HOSPITAL_COMMUNITY): Payer: Self-pay | Admitting: Emergency Medicine

## 2013-12-17 ENCOUNTER — Emergency Department (HOSPITAL_COMMUNITY): Payer: Medicare Other

## 2013-12-17 ENCOUNTER — Emergency Department (HOSPITAL_COMMUNITY)
Admission: EM | Admit: 2013-12-17 | Discharge: 2013-12-17 | Disposition: A | Discharge status: Home / Self Care | Payer: Medicare Other | Attending: Emergency Medicine | Admitting: Emergency Medicine

## 2013-12-17 DIAGNOSIS — R630 Anorexia: Secondary | ICD-10-CM | POA: Insufficient documentation

## 2013-12-17 DIAGNOSIS — G4700 Insomnia, unspecified: Secondary | ICD-10-CM | POA: Insufficient documentation

## 2013-12-17 DIAGNOSIS — Z8669 Personal history of other diseases of the nervous system and sense organs: Secondary | ICD-10-CM | POA: Insufficient documentation

## 2013-12-17 DIAGNOSIS — R296 Repeated falls: Secondary | ICD-10-CM | POA: Insufficient documentation

## 2013-12-17 DIAGNOSIS — Z79899 Other long term (current) drug therapy: Secondary | ICD-10-CM | POA: Insufficient documentation

## 2013-12-17 DIAGNOSIS — R5381 Other malaise: Secondary | ICD-10-CM | POA: Insufficient documentation

## 2013-12-17 DIAGNOSIS — R5383 Other fatigue: Secondary | ICD-10-CM

## 2013-12-17 DIAGNOSIS — Y939 Activity, unspecified: Secondary | ICD-10-CM | POA: Insufficient documentation

## 2013-12-17 DIAGNOSIS — Z043 Encounter for examination and observation following other accident: Secondary | ICD-10-CM | POA: Insufficient documentation

## 2013-12-17 DIAGNOSIS — IMO0001 Reserved for inherently not codable concepts without codable children: Secondary | ICD-10-CM | POA: Insufficient documentation

## 2013-12-17 DIAGNOSIS — F411 Generalized anxiety disorder: Secondary | ICD-10-CM | POA: Insufficient documentation

## 2013-12-17 DIAGNOSIS — R000 Tachycardia, unspecified: Secondary | ICD-10-CM | POA: Insufficient documentation

## 2013-12-17 DIAGNOSIS — R42 Dizziness and giddiness: Secondary | ICD-10-CM | POA: Insufficient documentation

## 2013-12-17 DIAGNOSIS — Y929 Unspecified place or not applicable: Secondary | ICD-10-CM | POA: Insufficient documentation

## 2013-12-17 DIAGNOSIS — R110 Nausea: Secondary | ICD-10-CM | POA: Insufficient documentation

## 2013-12-17 DIAGNOSIS — M129 Arthropathy, unspecified: Secondary | ICD-10-CM | POA: Insufficient documentation

## 2013-12-17 DIAGNOSIS — R531 Weakness: Secondary | ICD-10-CM

## 2013-12-17 DIAGNOSIS — I10 Essential (primary) hypertension: Secondary | ICD-10-CM | POA: Insufficient documentation

## 2013-12-17 HISTORY — DX: Anxiety disorder, unspecified: F41.9

## 2013-12-17 HISTORY — DX: Dizziness and giddiness: R42

## 2013-12-17 HISTORY — DX: Essential (primary) hypertension: I10

## 2013-12-17 LAB — CBC WITH DIFFERENTIAL/PLATELET
BASOS ABS: 0 10*3/uL (ref 0.0–0.1)
BASOS PCT: 1 % (ref 0–1)
EOS PCT: 4 % (ref 0–5)
Eosinophils Absolute: 0.3 10*3/uL (ref 0.0–0.7)
HEMATOCRIT: 46.1 % — AB (ref 36.0–46.0)
Hemoglobin: 15.7 g/dL — ABNORMAL HIGH (ref 12.0–15.0)
Lymphocytes Relative: 32 % (ref 12–46)
Lymphs Abs: 2.8 10*3/uL (ref 0.7–4.0)
MCH: 31.5 pg (ref 26.0–34.0)
MCHC: 34.1 g/dL (ref 30.0–36.0)
MCV: 92.4 fL (ref 78.0–100.0)
MONO ABS: 0.4 10*3/uL (ref 0.1–1.0)
Monocytes Relative: 5 % (ref 3–12)
NEUTROS ABS: 5.2 10*3/uL (ref 1.7–7.7)
Neutrophils Relative %: 58 % (ref 43–77)
PLATELETS: 323 10*3/uL (ref 150–400)
RBC: 4.99 MIL/uL (ref 3.87–5.11)
RDW: 14.1 % (ref 11.5–15.5)
WBC: 8.8 10*3/uL (ref 4.0–10.5)

## 2013-12-17 LAB — COMPREHENSIVE METABOLIC PANEL
ALBUMIN: 3.7 g/dL (ref 3.5–5.2)
ALT: 9 U/L (ref 0–35)
AST: 14 U/L (ref 0–37)
Alkaline Phosphatase: 78 U/L (ref 39–117)
BUN: 11 mg/dL (ref 6–23)
CALCIUM: 9.3 mg/dL (ref 8.4–10.5)
CHLORIDE: 106 meq/L (ref 96–112)
CO2: 25 mEq/L (ref 19–32)
Creatinine, Ser: 0.68 mg/dL (ref 0.50–1.10)
GFR calc Af Amer: >90 mL/min (ref >90)
GFR calc non Af Amer: 79 mL/min — ABNORMAL LOW (ref >90)
Glucose, Bld: 111 mg/dL — ABNORMAL HIGH (ref 70–99)
Potassium: 3.7 mEq/L (ref 3.7–5.3)
SODIUM: 146 meq/L (ref 137–147)
Total Bilirubin: 0.2 mg/dL — ABNORMAL LOW (ref 0.3–1.2)
Total Protein: 7.7 g/dL (ref 6.0–8.3)

## 2013-12-17 LAB — URINALYSIS, ROUTINE W REFLEX MICROSCOPIC
Bilirubin Urine: NEGATIVE
GLUCOSE, UA: NEGATIVE mg/dL
Ketones, ur: NEGATIVE mg/dL
Nitrite: NEGATIVE
PH: 6.5 (ref 5.0–8.0)
Protein, ur: NEGATIVE mg/dL
SPECIFIC GRAVITY, URINE: 1.008 (ref 1.005–1.030)
Urobilinogen, UA: 0.2 mg/dL (ref 0.0–1.0)

## 2013-12-17 LAB — URINE MICROSCOPIC-ADD ON

## 2013-12-17 LAB — TROPONIN I: Troponin I: <0.3 ng/mL (ref <0.30)

## 2013-12-17 MED ORDER — ENSURE ACTIVE HIGH PROTEIN PO LIQD: 1.0000 | Freq: Three times a day (TID) | ORAL | Status: DC

## 2013-12-17 MED ORDER — SODIUM CHLORIDE 0.9 % IV BOLUS (SEPSIS)
1000.0000 mL | Freq: Once | INTRAVENOUS | Status: AC
  Administered 2013-12-17: 1000 mL via INTRAVENOUS

## 2013-12-17 NOTE — Progress Notes (Signed)
12/17/2013 2206pm A. Mercer Stallworth RNCM [facility] spoke to patient and her son Jonny RuizJohn at bedside.  EDCM explained and offered printed information regarding [facility] to patient's son.  Patient's son is agreeable for [facility].  Patient's son concerned about patient's appetite.  EDPA in patient's room at time.  EDCM asked EDPa about Ensure supplements.  EDPA will write for prescription for Ensure supplements.  EDCM provided patient's son with six ten dollar coupons for a total of sixty dollars savings for Ensure.  Patient and patient's son very thankful for services.  No further EDCM needs at this time.

## 2013-12-17 NOTE — Discharge Instructions (Signed)
1. Medications: usual home medications 2. Treatment: rest, drink plenty of fluids,  3. Follow Up: Please followup with your primary doctor for discussion of your diagnoses and further evaluation after today's visit;     Fatigue Fatigue is a feeling of tiredness, lack of energy, lack of motivation, or feeling tired all the time. Having enough rest, good nutrition, and reducing stress will normally reduce fatigue. Consult your caregiver if it persists. The nature of your fatigue will help your caregiver to find out its cause. The treatment is based on the cause.  CAUSES  There are many causes for fatigue. Most of the time, fatigue can be traced to one or more of your habits or routines. Most causes fit into one or more of three general areas. They are: Lifestyle problems  Sleep disturbances.  Overwork.  Physical exertion.  Unhealthy habits.  Poor eating habits or eating disorders.  Alcohol and/or drug use .  Lack of proper nutrition (malnutrition). Psychological problems  Stress and/or anxiety problems.  Depression.  Grief.  Boredom. Medical Problems or Conditions  Anemia.  Pregnancy.  Thyroid gland problems.  Recovery from major surgery.  Continuous pain.  Emphysema or asthma that is not well controlled  Allergic conditions.  Diabetes.  Infections (such as mononucleosis).  Obesity.  Sleep disorders, such as sleep apnea.  Heart failure or other heart-related problems.  Cancer.  Kidney disease.  Liver disease.  Effects of certain medicines such as antihistamines, cough and cold remedies, prescription pain medicines, heart and blood pressure medicines, drugs used for treatment of cancer, and some antidepressants. SYMPTOMS  The symptoms of fatigue include:   Lack of energy.  Lack of drive (motivation).  Drowsiness.  Feeling of indifference to the surroundings. DIAGNOSIS  The details of how you feel help guide your caregiver in finding out what  is causing the fatigue. You will be asked about your present and past health condition. It is important to review all medicines that you take, including prescription and non-prescription items. A thorough exam will be done. You will be questioned about your feelings, habits, and normal lifestyle. Your caregiver may suggest blood tests, urine tests, or other tests to look for common medical causes of fatigue.  TREATMENT  Fatigue is treated by correcting the underlying cause. For example, if you have continuous pain or depression, treating these causes will improve how you feel. Similarly, adjusting the dose of certain medicines will help in reducing fatigue.  HOME CARE INSTRUCTIONS   Try to get the required amount of good sleep every night.  Eat a healthy and nutritious diet, and drink enough water throughout the day.  Practice ways of relaxing (including yoga or meditation).  Exercise regularly.  Make plans to change situations that cause stress. Act on those plans so that stresses decrease over time. Keep your work and personal routine reasonable.  Avoid street drugs and minimize use of alcohol.  Start taking a daily multivitamin after consulting your caregiver. SEEK MEDICAL CARE IF:   You have persistent tiredness, which cannot be accounted for.  You have fever.  You have unintentional weight loss.  You have headaches.  You have disturbed sleep throughout the night.  You are feeling sad.  You have constipation.  You have dry skin.  You have gained weight.  You are taking any new or different medicines that you suspect are causing fatigue.  You are unable to sleep at night.  You develop any unusual swelling of your legs or other parts of  your body. SEEK IMMEDIATE MEDICAL CARE IF:   You are feeling confused.  Your vision is blurred.  You feel faint or pass out.  You develop severe headache.  You develop severe abdominal, pelvic, or back pain.  You develop chest  pain, shortness of breath, or an irregular or fast heartbeat.  You are unable to pass a normal amount of urine.  You develop abnormal bleeding such as bleeding from the rectum or you vomit blood.  You have thoughts about harming yourself or committing suicide.  You are worried that you might harm someone else. MAKE SURE YOU:   Understand these instructions.  Will watch your condition.  Will get help right away if you are not doing well or get worse. Document Released: 06/11/2007 Document Revised: 11/06/2011 Document Reviewed: 06/11/2007 [name] Patient Information 2014 [place], [place].    Weakness Weakness is a lack of strength. It may be felt all over the body (generalized) or in one specific part of the body (focal). Some causes of weakness can be serious. You may need further medical evaluation, especially if you are elderly or you have a history of immunosuppression (such as chemotherapy or HIV), kidney disease, heart disease, or diabetes. CAUSES  Weakness can be caused by many different things, including:  Infection.  Physical exhaustion.  Internal bleeding or other blood loss that results in a lack of red blood cells (anemia).  Dehydration. This cause is more common in elderly people.  Side effects or electrolyte abnormalities from medicines, such as pain medicines or sedatives.  Emotional distress, anxiety, or depression.  Circulation problems, especially severe peripheral arterial disease.  Heart disease, such as rapid atrial fibrillation, bradycardia, or heart failure.  Nervous system disorders, such as Guillain-Barr syndrome, multiple sclerosis, or stroke. DIAGNOSIS  To find the cause of your weakness, your caregiver will take your history and perform a physical exam. Lab tests or X-rays may also be ordered, if needed. TREATMENT  Treatment of weakness depends on the cause of your symptoms and can vary greatly. HOME CARE INSTRUCTIONS   Rest as  needed.  Eat a well-balanced diet.  Try to get some exercise every day.  Only take over-the-counter or prescription medicines as directed by your caregiver. SEEK MEDICAL CARE IF:   Your weakness seems to be getting worse or spreads to other parts of your body.  You develop new aches or pains. SEEK IMMEDIATE MEDICAL CARE IF:   You cannot perform your normal daily activities, such as getting dressed and feeding yourself.  You cannot walk up and down stairs, or you feel exhausted when you do so.  You have shortness of breath or chest pain.  You have difficulty moving parts of your body.  You have weakness in only one area of the body or on only one side of the body.  You have a fever.  You have trouble speaking or swallowing.  You cannot control your bladder or bowel movements.  You have black or bloody vomit or stools. MAKE SURE YOU:  Understand these instructions.  Will watch your condition.  Will get help right away if you are not doing well or get worse. Document Released: 08/14/2005 Document Revised: 02/13/2012 Document Reviewed: 10/13/2011 [facility] Patient Information 2014 [place], [place].

## 2013-12-17 NOTE — ED Notes (Signed)
Went to collect urine, pt in CT

## 2013-12-17 NOTE — ED Notes (Signed)
Bed: WA21 Expected date:  Expected time:  Means of arrival:  Comments: Ems, elderly, generalized weakness

## 2013-12-17 NOTE — ED Notes (Signed)
Pt presents with NAD. Per [facility]. Pt c/o of generalized weakness x 2 weeks. 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 Pt HX of vertigo. HX of recent falls. Increased fall recent related to weakness. Home health RN does she pt. Pt placed on antivert for vertigo. Symptoms improved. Weakness continues. Neuro intact otherwise.

## 2013-12-17 NOTE — ED Provider Notes (Signed)
CSN: [id number]     Arrival date & time 12/17/13  1813 History   First MD Initiated Contact with Patient 12/17/13 1823     Chief Complaint  Patient presents with  . Weakness     (Consider location/radiation/quality/duration/timing/severity/associated sxs/prior Treatment) The history is provided by the patient and medical records. No language interpreter was used.    Nicole LEFEBER is a 80 y.o. female  with a hx of insomnia, fibromyalgia, Meniere's disease, HTN, anxiety, vertigo presents to the Emergency Department complaining of gradual, persistent, progressively worsening weakness onset 2 weeks ago with associated vertigo symptoms.  Pt saw her PCP who gave her antivert for her vertigo and dizziness resolved, but the weakness, persists.  Pt reports 30 years of vertigo symptoms.  Pt reports more falls since December and a worsening of her Vertigo.  Pt uses a wheelchair and walker at home.  Pt was attending PT at home, but did not like the [place] maneuvers being performed and fired the PT assistant.  She does not live alone - she lives with a daughter.  Pt reports several falls in the last 2 weeks, but has not been evaluated for these.  Associated symptoms include nausea and decreased appetite.  EMS reports HTN, but pt reports she is taking her meds as directed. Pt is a poor historian and has difficulty remaining on topic and telling a linear story.   Pt denies fever, chills, headache, neck pain, chest pain, SOB, abd pain, N/V/D, weakness, dizziness, syncope, dysuria, hematuria.       Past Medical History  Diagnosis Date  . Insomnia   . Fibromyalgia   . Arthritis   . Meniere's disease 1989  . Hypertension   . Anxiety   . Vertigo    Past Surgical History  Procedure Laterality Date  . Appendectomy    . Abdominal hysterectomy     No family history on file. History  Substance Use Topics  . Smoking status: Never Smoker   . Smokeless tobacco: Never Used  . Alcohol Use: Yes     Comment:  drinks 1 glass of liquor maybe twice a year   OB History   Grav Para Term Preterm Abortions TAB SAB Ect Mult Living                 Review of Systems  Constitutional: Negative for fever, diaphoresis, appetite change, fatigue and unexpected weight change.  HENT: Negative for mouth sores.   Eyes: Negative for visual disturbance.  Respiratory: Negative for cough, chest tightness, shortness of breath and wheezing.   Cardiovascular: Negative for chest pain.  Gastrointestinal: Negative for nausea, vomiting, abdominal pain, diarrhea and constipation.  Endocrine: Negative for polydipsia, polyphagia and polyuria.  Genitourinary: Negative for dysuria, urgency, frequency and hematuria.  Musculoskeletal: Negative for back pain and neck stiffness.  Skin: Negative for rash.  Allergic/Immunologic: Negative for immunocompromised state.  Neurological: Positive for weakness. Negative for syncope, light-headedness and headaches.  Hematological: Does not bruise/bleed easily.  Psychiatric/Behavioral: Negative for sleep disturbance. The patient is not nervous/anxious.       Allergies  Shellfish allergy  Home Medications   Prior to Admission medications   Medication Sig Start Date End Date Taking? Authorizing Provider  ALPRAZolam (XANAX) 0.25 MG tablet Take 0.25 mg by mouth 2 (two) times daily as needed.  08/09/13   Historical Provider, MD  amLODipine (NORVASC) 5 MG tablet Take 1 tablet (5 mg total) by mouth daily. 08/18/13   [name], MD  aspirin-acetaminophen-caffeine (  EXCEDRIN EXTRA STRENGTH) 250-250-65 MG per tablet Take 1 tablet by mouth every 6 (six) hours as needed. Used for pain     Historical Provider, MD  escitalopram (LEXAPRO) 10 MG tablet Take 10 mg by mouth daily. 07/11/13   Historical Provider, MD  HYDROcodone-acetaminophen (NORCO/VICODIN) 5-325 MG per tablet Take 1 tablet by mouth 3 (three) times daily after meals. 07/17/13   Historical Provider, MD  omeprazole (PRILOSEC) 20 MG  capsule Take 20 mg by mouth daily. 07/11/13   Historical Provider, MD  traMADol (ULTRAM) 50 MG tablet Take 50 mg by mouth every 6 (six) hours as needed. Maximum dose= 8 tablets per day as needed for pain     Historical Provider, MD  Vitamin D, Ergocalciferol, (DRISDOL) 50000 UNITS CAPS Take 50,000 Units by mouth. Takes one cap on Mon, Wed, and Fri for 2 months, then 1 cap a week     Historical Provider, MD  zolpidem (AMBIEN) 10 MG tablet Take 10 mg by mouth at bedtime as needed.      Historical Provider, MD   BP 155/84  Pulse 100  Temp(Src) 98.1 F (36.7 C) (Oral)  Resp 24  SpO2 96% Physical Exam  Nursing note and vitals reviewed. Constitutional: She is oriented to person, place, and time. She appears well-developed and well-nourished. No distress.  HENT:  Head: Normocephalic and atraumatic.  Mouth/Throat: Oropharynx is clear and moist.  No contusions or abrasions  Eyes: Conjunctivae and EOM are normal. Pupils are equal, round, and reactive to light. No scleral icterus.  Neck: Normal range of motion. Neck supple.  Cardiovascular: Regular rhythm, normal heart sounds and intact distal pulses.   No murmur heard. Mild tachycardia No murmur  Pulmonary/Chest: Effort normal and breath sounds normal. No respiratory distress. She has no wheezes. She has no rales.  Clear and equal  Abdominal: Soft. Bowel sounds are normal. She exhibits no distension. There is no tenderness. There is no rebound and no guarding.  Soft and nontender  Musculoskeletal: Normal range of motion. She exhibits no tenderness.  Lymphadenopathy:    She has no cervical adenopathy.  Neurological: She is alert and oriented to person, place, and time. She has normal reflexes. No cranial nerve deficit. She exhibits normal muscle tone. Coordination normal.  Mental Status:  Alert, oriented, thought content appropriate. Speech fluent without evidence of aphasia. Able to follow 2 step commands without difficulty.  Cranial Nerves:   II:  Peripheral visual fields grossly normal, pupils equal, round, reactive to light III,IV, VI: ptosis not present, extra-ocular motions intact bilaterally  V,VII: smile symmetric, facial light touch sensation equal VIII: hearing grossly normal bilaterally  IX,X: gag reflex present  XI: bilateral shoulder shrug equal and strong XII: midline tongue extension  Motor:  5/5 in upper and lower extremities bilaterally including strong and equal grip strength and dorsiflexion/plantar flexion Sensory: Pinprick and light touch normal in all extremities.  Deep Tendon Reflexes: 2+ and symmetric  Cerebellar: normal finger-to-nose with bilateral upper extremities Gait: not assessed as she reports she is too weak to walk CV: distal pulses palpable throughout   Skin: Skin is warm and dry. No rash noted. She is not diaphoretic. No erythema.  No skin lesions noted  Psychiatric: She has a normal mood and affect. Her behavior is normal. Judgment and thought content normal.    ED Course  Procedures (including critical care time) Labs Review Labs Reviewed  CBC WITH DIFFERENTIAL - Abnormal; Notable for the following:    Hemoglobin 15.7 (*)  HCT 46.1 (*)    All other components within normal limits  COMPREHENSIVE METABOLIC PANEL - Abnormal; Notable for the following:    Glucose, Bld 111 (*)    Total Bilirubin 0.2 (*)    GFR calc non Af Amer 79 (*)    All other components within normal limits  URINALYSIS, ROUTINE W REFLEX MICROSCOPIC - Abnormal; Notable for the following:    Hgb urine dipstick SMALL (*)    Leukocytes, UA SMALL (*)    All other components within normal limits  URINE CULTURE  TROPONIN I  URINE MICROSCOPIC-ADD ON    Imaging Review Dg Chest 2 View  12/17/2013   CLINICAL DATA:  Larey Seat, weakness.  EXAM: CHEST - 2 VIEW  COMPARISON:  08/18/2013  FINDINGS: Lungs are hyperinflated, with some linear subsegmental atelectasis or scar at the left lung base, otherwise clear. Heart size and  mediastinal contours are within normal limits. Atheromatous aortic arch. No effusion. Visualized skeletal structures are unremarkable. Small hiatal hernia.  IMPRESSION: No acute cardiopulmonary disease.  Small hiatal hernia.   Electronically Signed   By: Oley Balm M.D.   On: 12/17/2013 19:22   Ct Head Wo Contrast  12/17/2013   CLINICAL DATA:  History of a fall.  Weakness.  EXAM: CT HEAD WITHOUT CONTRAST  CT CERVICAL SPINE WITHOUT CONTRAST  TECHNIQUE: Multidetector CT imaging of the head and cervical spine was performed following the standard protocol without intravenous contrast. Multiplanar CT image reconstructions of the cervical spine were also generated.  COMPARISON:  Head CT 08/17/2013.  C-spine CT 08/15/2013.  FINDINGS: CT HEAD FINDINGS  Mild cerebral atrophy. Patchy and confluent areas of decreased attenuation are noted throughout the deep and periventricular white matter of the cerebral hemispheres bilaterally, compatible with chronic microvascular ischemic disease. Multifocal tiny foci of low attenuation throughout the basal ganglia bilaterally and the bowel amount bilaterally, compatible old lacunar infarctions. No acute displaced skull fractures are identified. No acute intracranial abnormality. Specifically, no evidence of acute post-traumatic intracranial hemorrhage, no definite regions of acute/subacute cerebral ischemia, no focal mass, mass effect, hydrocephalus or abnormal intra or extra-axial fluid collections. The visualized paranasal sinuses are well pneumatized. Small bilateral mastoid effusions.  CT CERVICAL SPINE FINDINGS  No acute displaced fractures of the cervical spine. Alignment is anatomic. Prevertebral soft tissues are normal. Mild multilevel degenerative disc disease and mild multilevel facet arthropathy, most severe at C4-C5 and C5-C6. Visualized portions of the upper thorax are unremarkable.  IMPRESSION: 1. No acute intracranial abnormalities. 2. No evidence of acute  traumatic injury to the cervical spine. 3. Mild cerebral atrophy with extensive chronic microvascular ischemic changes in the cerebral white matter and multifocal old lacunar infarctions in the basal ganglia and thalami bilaterally.   Electronically Signed   By: Trudie Reed M.D.   On: 12/17/2013 19:23   Ct Cervical Spine Wo Contrast  12/17/2013   CLINICAL DATA:  History of a fall.  Weakness.  EXAM: CT HEAD WITHOUT CONTRAST  CT CERVICAL SPINE WITHOUT CONTRAST  TECHNIQUE: Multidetector CT imaging of the head and cervical spine was performed following the standard protocol without intravenous contrast. Multiplanar CT image reconstructions of the cervical spine were also generated.  COMPARISON:  Head CT 08/17/2013.  C-spine CT 08/15/2013.  FINDINGS: CT HEAD FINDINGS  Mild cerebral atrophy. Patchy and confluent areas of decreased attenuation are noted throughout the deep and periventricular white matter of the cerebral hemispheres bilaterally, compatible with chronic microvascular ischemic disease. Multifocal tiny foci of low attenuation throughout the  basal ganglia bilaterally and the bowel amount bilaterally, compatible old lacunar infarctions. No acute displaced skull fractures are identified. No acute intracranial abnormality. Specifically, no evidence of acute post-traumatic intracranial hemorrhage, no definite regions of acute/subacute cerebral ischemia, no focal mass, mass effect, hydrocephalus or abnormal intra or extra-axial fluid collections. The visualized paranasal sinuses are well pneumatized. Small bilateral mastoid effusions.  CT CERVICAL SPINE FINDINGS  No acute displaced fractures of the cervical spine. Alignment is anatomic. Prevertebral soft tissues are normal. Mild multilevel degenerative disc disease and mild multilevel facet arthropathy, most severe at C4-C5 and C5-C6. Visualized portions of the upper thorax are unremarkable.  IMPRESSION: 1. No acute intracranial abnormalities. 2. No  evidence of acute traumatic injury to the cervical spine. 3. Mild cerebral atrophy with extensive chronic microvascular ischemic changes in the cerebral white matter and multifocal old lacunar infarctions in the basal ganglia and thalami bilaterally.   Electronically Signed   By: Trudie Reedaniel  Entrikin M.D.   On: 12/17/2013 19:23     EKG Interpretation None      ECG:  Date: 12/18/2013  Rate: 115  Rhythm: sinus tachycardia and multifocal premature ventricular contractions (PVC)  QRS Axis: normal  Intervals: normal  ST/T Wave abnormalities: normal  Conduction Disutrbances:none  Narrative Interpretation: Sinus tachycardia with multiform ventricular premature complexes, borderline report abnormality, unchanged from 08/09/2013  Old EKG Reviewed: unchanged     Angiocath insertion Performed by: Dierdre ForthHannah Akil Hoos  Consent: Verbal consent obtained. Risks and benefits: risks, benefits and alternatives were discussed Time out: Immediately prior to procedure a "time out" was called to verify the correct patient, procedure, equipment, support staff and site/side marked as required.  Preparation: Patient was prepped and draped in the usual sterile fashion.  Vein Location: right AC  Not Ultrasound Guided  Gauge: 20ga  Normal blood return and flush without difficulty Patient tolerance: Patient tolerated the procedure well with no immediate complications.    MDM   Final diagnoses:  General weakness    Nicole Key presents with increasing weakness after stopping PT at home or increasing weakness.  Her son reports that she is not eating as much as usual. He reports the last 3 days her dizziness has been worse however she has not been taking the Antivert as directed.  About neurological deficits. No nystagmus seen on exam. Will obtain labs, give fluid bolus and reevaluate.    8:45PM Patient labs reassuring. Hemoconcentration with increased hemoglobin. Patient's been given a 1 L fluid  bolus. Urinalysis pending.  9:15PM UA without evidence of urinary tract infection. Small leukocytes and 7 at 10 white blood cells. Urine culture sent.  ECG nonischemic and troponin negative. No evidence that this is a cardiac manifestation.  Patient's symptoms likely secondary to decreased by mouth intake, decreased physical therapy and increasing dizziness.  9:57 PM Pt able to stand without assistance, but reports she is too dizzy to walk and will not attempt.  Discussed with son and case management.  Pt has a wheelchair at home along with bedside commode.  Pt instructed to use her wheelchair as opposed to the walker until her dizziness is improved.     Discussed these findings with the patient and son. He has been given resources for outpatient case management. I recommended the patient see her primary care physician for further discussion of rehabilitation placement versus home physical therapy. Patient is return here to the emergency department for increasing confusion, intractable nausea vomiting or repeat falls.  It has been determined that no  acute conditions requiring further emergency intervention are present at this time. The patient/guardian have been advised of the diagnosis and plan. We have discussed signs and symptoms that warrant return to the ED, such as changes or worsening in symptoms.   Vital signs are stable at discharge.   BP 155/84  Pulse 100  Temp(Src) 98.1 F (36.7 C) (Oral)  Resp 24  SpO2 96%  Patient/guardian has voiced understanding and agreed to follow-up with the PCP or specialist.  The patient was discussed with and seen by Dr. Juleen ChinaKohut who agrees with the treatment plan.   Dahlia ClientHannah Daisa Stennis, PA-C 12/18/13 [phone]

## 2013-12-17 NOTE — Progress Notes (Signed)
  CARE MANAGEMENT ED NOTE 12/17/2013  Patient:  Nicole Key,Nicole Key   Account Number:  [id number]  Date Initiated:  12/17/2013  Documentation initiated by:  Radford PaxFERRERO,Javani Spratt  Subjective/Objective Assessment:   Patient presents to Ed with increased weakness for two weeks, recent falls at home.     Subjective/Objective Assessment Detail:   Patient with pmhx of Meniere's disease, anxiety, htn, vertigo     Action/Plan:   Action/Plan Detail:   Anticipated DC Date:       Status Recommendation to Physician:   Result of Recommendation:    Other ED Services  Consult Working Plan    DC [employer]  Other    Choice offered to / List presented to:            Status of service:  Completed, signed off  ED Comments:   ED Comments Detail:  EDCM spoke to patient and her son Nicole Key at bedside.  Patient lives at home with her daughter, son-in-law and her two grown grandchildren.  Patient [facility].  Patient's son reports patient had PT at one point but the patient decided to end this service.  Patient stated, "I loved the physical therapist, I just couldn't be moved around too much.  It made me too dizzy."  Patient has a cane, walker, bedside commode and a wheelchair at home.  Patient reports she lives upstairs in the house.  Her family prepares her meals for her and brings it upstairs.  Patient reports she is able to complete her ADL's on her own but will ask for assistance when needed.  Patient confirms her pcp is Dr. Jacky Key.  Please contact case manager if further assistance is needed.  No further EDCM needs at this time.

## 2013-12-19 LAB — URINE CULTURE
Colony Count: NO GROWTH
Culture: NO GROWTH

## 2013-12-19 NOTE — ED Provider Notes (Signed)
Medical screening examination/treatment/procedure(s) were conducted as a shared visit with non-physician practitioner(s) and myself.  I personally evaluated the patient during the encounter.   EKG Interpretation   Date/Time:  Wednesday December 17 2013 18:26:06 EDT Ventricular Rate:  115 PR Interval:  152 QRS Duration: 93 QT Interval:  348 QTC Calculation: 481 R Axis:   75 Text Interpretation:  Sinus tachycardia Multiform ventricular premature  complexes Borderline repol abnormality, diffuse leads ED PHYSICIAN  INTERPRETATION AVAILABLE IN [facility] Confirmed by TEST, Record  ([id number]) on 12/19/2013 7:01:38 AM     85yF with gradual onset and progressively worsening weakness. Mobility has been generally poor. Reports falls recently but last one was over a weak ago. Pt has home nursing. She was not happy with PT and dismissed them. She is not interested in any type of placement, even temporarily. Pt has hx of meniere's and persistent vertigo hampers her ability to ambulate. I think a large contributing factor is deconditioning. No indication for hospitalization or further ED w/u. Pt encouraged to discuss with her PCP about potentially making arrangements with PT again or pursuing other options.   Raeford RazorStephen Lendy Dittrich, MD 12/19/13 [phone]

## 2014-11-23 ENCOUNTER — Encounter: Payer: Self-pay | Admitting: *Deleted

## 2014-11-23 ENCOUNTER — Other Ambulatory Visit: Payer: Self-pay | Admitting: *Deleted

## 2014-11-23 NOTE — Patient Outreach (Addendum)
[facility]) Care Management  [place]  11/23/2014   AMINA MENCHACA 1928-01-04 [id number]  Subjective: Pt reports she f/u with [place] (MD's assistant)recently, called in a rx for pain patch, was told by pharmacy cost $500 which she did not get.  Pt states her pain is managed as long as she takes her pain med.  Pt states she also took a month of her new sleeping med, did not help much, stopped because of the cost.   Pt states she told  [place] could not  Afford med and someone in the office is trying to get it passed through.  Pt  states as far as her BP, when she talked to  [facility] (MD's medical assistant), was told MD not concerned about her BP being high.  Pt states she had a throwing up episode last week and last night, took Phenergan (newly prescribed), med helped. Pt states appetite is good, thinking about taking Boost again, it helped.  Pt states as far as her insurance, still has [facility], provided card at recent MD office visit (2/29)   Objective: Review of systems done.                       Musculoskeletal- positive for left shoulder pain,2/10 on the pain scale, dull.                    Viewed pt's most current insurance card- [employer]     Assessment: Health assessment done.    Plan: Plan to discharge pt from RN CM services, goals met.           Plan to inform Dr. Reynaldo Minium of case closure.            Plan to have today's pt summary mailed to pt.

## 2014-12-01 NOTE — Patient Outreach (Signed)
[place] [facility]) Care Management  12/01/2014  NOZOMI METTLER 01/12/1928 [id number]   Received notification from Kathie Rhodes, RN to close case due to patient has met goals.  Case closed at this time.  Ronnell Freshwater. [place] CM Assistant Phone: [phone] Fax: [phone]

## 2015-02-03 ENCOUNTER — Encounter (HOSPITAL_COMMUNITY): Payer: Self-pay | Admitting: Emergency Medicine

## 2015-02-03 ENCOUNTER — Emergency Department (HOSPITAL_COMMUNITY): Payer: Medicare Other

## 2015-02-03 ENCOUNTER — Inpatient Hospital Stay (HOSPITAL_COMMUNITY)
Admission: EM | Admit: 2015-02-03 | Discharge: 2015-02-09 | DRG: 871 | Disposition: A | Discharge status: Skilled Nursing Facility | Payer: Medicare Other | Attending: Internal Medicine | Admitting: Internal Medicine

## 2015-02-03 DIAGNOSIS — F32A Depression, unspecified: Secondary | ICD-10-CM | POA: Diagnosis present

## 2015-02-03 DIAGNOSIS — R2681 Unsteadiness on feet: Secondary | ICD-10-CM | POA: Diagnosis present

## 2015-02-03 DIAGNOSIS — J9601 Acute respiratory failure with hypoxia: Secondary | ICD-10-CM | POA: Insufficient documentation

## 2015-02-03 DIAGNOSIS — Z681 Body mass index (BMI) 19 or less, adult: Secondary | ICD-10-CM

## 2015-02-03 DIAGNOSIS — R269 Unspecified abnormalities of gait and mobility: Secondary | ICD-10-CM | POA: Diagnosis present

## 2015-02-03 DIAGNOSIS — W19XXXA Unspecified fall, initial encounter: Secondary | ICD-10-CM | POA: Diagnosis present

## 2015-02-03 DIAGNOSIS — M797 Fibromyalgia: Secondary | ICD-10-CM | POA: Diagnosis present

## 2015-02-03 DIAGNOSIS — F329 Major depressive disorder, single episode, unspecified: Secondary | ICD-10-CM | POA: Diagnosis present

## 2015-02-03 DIAGNOSIS — R531 Weakness: Secondary | ICD-10-CM

## 2015-02-03 DIAGNOSIS — B965 Pseudomonas (aeruginosa) (mallei) (pseudomallei) as the cause of diseases classified elsewhere: Secondary | ICD-10-CM | POA: Diagnosis present

## 2015-02-03 DIAGNOSIS — Z66 Do not resuscitate: Secondary | ICD-10-CM | POA: Diagnosis present

## 2015-02-03 DIAGNOSIS — K449 Diaphragmatic hernia without obstruction or gangrene: Secondary | ICD-10-CM

## 2015-02-03 DIAGNOSIS — T17990A Other foreign object in respiratory tract, part unspecified in causing asphyxiation, initial encounter: Secondary | ICD-10-CM | POA: Diagnosis present

## 2015-02-03 DIAGNOSIS — Z8249 Family history of ischemic heart disease and other diseases of the circulatory system: Secondary | ICD-10-CM

## 2015-02-03 DIAGNOSIS — R0602 Shortness of breath: Secondary | ICD-10-CM | POA: Diagnosis not present

## 2015-02-03 DIAGNOSIS — J690 Pneumonitis due to inhalation of food and vomit: Secondary | ICD-10-CM | POA: Insufficient documentation

## 2015-02-03 DIAGNOSIS — A419 Sepsis, unspecified organism: Principal | ICD-10-CM | POA: Diagnosis present

## 2015-02-03 DIAGNOSIS — J90 Pleural effusion, not elsewhere classified: Secondary | ICD-10-CM | POA: Diagnosis present

## 2015-02-03 DIAGNOSIS — I10 Essential (primary) hypertension: Secondary | ICD-10-CM | POA: Diagnosis present

## 2015-02-03 DIAGNOSIS — H8109 Meniere's disease, unspecified ear: Secondary | ICD-10-CM | POA: Diagnosis present

## 2015-02-03 DIAGNOSIS — R1310 Dysphagia, unspecified: Secondary | ICD-10-CM | POA: Diagnosis present

## 2015-02-03 DIAGNOSIS — K5730 Diverticulosis of large intestine without perforation or abscess without bleeding: Secondary | ICD-10-CM | POA: Diagnosis present

## 2015-02-03 DIAGNOSIS — F411 Generalized anxiety disorder: Secondary | ICD-10-CM | POA: Diagnosis present

## 2015-02-03 DIAGNOSIS — F419 Anxiety disorder, unspecified: Secondary | ICD-10-CM | POA: Diagnosis present

## 2015-02-03 DIAGNOSIS — J189 Pneumonia, unspecified organism: Secondary | ICD-10-CM | POA: Diagnosis present

## 2015-02-03 DIAGNOSIS — E43 Unspecified severe protein-calorie malnutrition: Secondary | ICD-10-CM | POA: Insufficient documentation

## 2015-02-03 DIAGNOSIS — Z801 Family history of malignant neoplasm of trachea, bronchus and lung: Secondary | ICD-10-CM

## 2015-02-03 DIAGNOSIS — K219 Gastro-esophageal reflux disease without esophagitis: Secondary | ICD-10-CM | POA: Diagnosis present

## 2015-02-03 DIAGNOSIS — R042 Hemoptysis: Secondary | ICD-10-CM

## 2015-02-03 HISTORY — DX: Gastro-esophageal reflux disease without esophagitis: K21.9

## 2015-02-03 LAB — URINE MICROSCOPIC-ADD ON

## 2015-02-03 LAB — TYPE AND SCREEN
ABO/RH(D): O POS
Antibody Screen: NEGATIVE

## 2015-02-03 LAB — COMPREHENSIVE METABOLIC PANEL
ALT: 11 U/L — ABNORMAL LOW (ref 14–54)
ANION GAP: 13 (ref 5–15)
AST: 25 U/L (ref 15–41)
Albumin: 3.4 g/dL — ABNORMAL LOW (ref 3.5–5.0)
Alkaline Phosphatase: 100 U/L (ref 38–126)
BILIRUBIN TOTAL: 0.8 mg/dL (ref 0.3–1.2)
BUN: 15 mg/dL (ref 6–20)
CO2: 29 mmol/L (ref 22–32)
CREATININE: 0.64 mg/dL (ref 0.44–1.00)
Calcium: 9 mg/dL (ref 8.9–10.3)
Chloride: 98 mmol/L — ABNORMAL LOW (ref 101–111)
Glucose, Bld: 144 mg/dL — ABNORMAL HIGH (ref 65–99)
Potassium: 3.6 mmol/L (ref 3.5–5.1)
SODIUM: 140 mmol/L (ref 135–145)
TOTAL PROTEIN: 7.2 g/dL (ref 6.5–8.1)

## 2015-02-03 LAB — URINALYSIS, ROUTINE W REFLEX MICROSCOPIC
Bilirubin Urine: NEGATIVE
Glucose, UA: NEGATIVE mg/dL
KETONES UR: NEGATIVE mg/dL
Leukocytes, UA: NEGATIVE
NITRITE: NEGATIVE
Protein, ur: 30 mg/dL — AB
SPECIFIC GRAVITY, URINE: 1.02 (ref 1.005–1.030)
Urobilinogen, UA: 0.2 mg/dL (ref 0.0–1.0)
pH: 7.5 (ref 5.0–8.0)

## 2015-02-03 LAB — CBC WITH DIFFERENTIAL/PLATELET
Basophils Absolute: 0 10*3/uL (ref 0.0–0.1)
Basophils Relative: 0 % (ref 0–1)
Eosinophils Absolute: 0 10*3/uL (ref 0.0–0.7)
Eosinophils Relative: 0 % (ref 0–5)
HCT: 43.3 % (ref 36.0–46.0)
Hemoglobin: 14.2 g/dL (ref 12.0–15.0)
Lymphocytes Relative: 5 % — ABNORMAL LOW (ref 12–46)
Lymphs Abs: 1.9 10*3/uL (ref 0.7–4.0)
MCH: 30.9 pg (ref 26.0–34.0)
MCHC: 32.8 g/dL (ref 30.0–36.0)
MCV: 94.1 fL (ref 78.0–100.0)
MONO ABS: 3 10*3/uL — AB (ref 0.1–1.0)
Monocytes Relative: 8 % (ref 3–12)
NEUTROS ABS: 33.1 10*3/uL — AB (ref 1.7–7.7)
Neutrophils Relative %: 87 % — ABNORMAL HIGH (ref 43–77)
PLATELETS: 476 10*3/uL — AB (ref 150–400)
RBC: 4.6 MIL/uL (ref 3.87–5.11)
RDW: 15.3 % (ref 11.5–15.5)
WBC: 38 10*3/uL — ABNORMAL HIGH (ref 4.0–10.5)

## 2015-02-03 LAB — PROTIME-INR
INR: 1.05 (ref 0.00–1.49)
Prothrombin Time: 13.9 seconds (ref 11.6–15.2)

## 2015-02-03 LAB — TROPONIN I

## 2015-02-03 LAB — ABO/RH: ABO/RH(D): O POS

## 2015-02-03 LAB — POC OCCULT BLOOD, ED: Fecal Occult Bld: NEGATIVE

## 2015-02-03 MED ORDER — ONDANSETRON HCL 4 MG/2ML IJ SOLN
4.0000 mg | Freq: Once | INTRAMUSCULAR | Status: AC
  Administered 2015-02-03: 4 mg via INTRAVENOUS
  Filled 2015-02-03: qty 2

## 2015-02-03 MED ORDER — MORPHINE SULFATE 4 MG/ML IJ SOLN
6.0000 mg | Freq: Once | INTRAMUSCULAR | Status: AC
Start: 2015-02-03 — End: 2015-02-03
  Administered 2015-02-03: 6 mg via INTRAVENOUS
  Filled 2015-02-03: qty 2

## 2015-02-03 MED ORDER — SODIUM CHLORIDE 0.9 % IV BOLUS (SEPSIS)
1000.0000 mL | Freq: Once | INTRAVENOUS | Status: AC
  Administered 2015-02-03: 1000 mL via INTRAVENOUS

## 2015-02-03 MED ORDER — MORPHINE SULFATE 2 MG/ML IJ SOLN
2.0000 mg | Freq: Once | INTRAMUSCULAR | Status: AC
  Administered 2015-02-03: 2 mg via INTRAVENOUS
  Filled 2015-02-03: qty 1

## 2015-02-03 MED ORDER — MORPHINE SULFATE 4 MG/ML IJ SOLN
4.0000 mg | Freq: Once | INTRAMUSCULAR | Status: AC
  Administered 2015-02-03: 4 mg via INTRAVENOUS
  Filled 2015-02-03: qty 1

## 2015-02-03 NOTE — ED Notes (Signed)
Patient transported to CT 

## 2015-02-03 NOTE — ED Notes (Signed)
Pt presents to ED via EMS following a fall at home one week ago.  During the unwitnessed fall, she bumped her face and has a large and clearly visible hematoma on her left cheekbone. She is a/o x 4 and reports that she has had poor appetite, weakness, lethargy, and emesis for one week.  There was blood in her emesis up until yesterday, and now the pt reports that emesis is brown and green.  Pt is also coughing up brown and green sputum.  Lung sounds clear/diminished bilaterally despite productive cough.  Pt denies pain, dizziness, other symptoms but endorses extreme weakness.  Pt's family is present at the bedside and reports hx of Meniere's disease.

## 2015-02-03 NOTE — ED Notes (Signed)
Patient transported to X-ray 

## 2015-02-04 ENCOUNTER — Inpatient Hospital Stay (HOSPITAL_COMMUNITY): Payer: Medicare Other

## 2015-02-04 ENCOUNTER — Encounter (HOSPITAL_COMMUNITY): Payer: Self-pay

## 2015-02-04 DIAGNOSIS — E43 Unspecified severe protein-calorie malnutrition: Secondary | ICD-10-CM | POA: Insufficient documentation

## 2015-02-04 DIAGNOSIS — F329 Major depressive disorder, single episode, unspecified: Secondary | ICD-10-CM | POA: Diagnosis present

## 2015-02-04 DIAGNOSIS — Z66 Do not resuscitate: Secondary | ICD-10-CM | POA: Diagnosis present

## 2015-02-04 DIAGNOSIS — R0602 Shortness of breath: Secondary | ICD-10-CM | POA: Diagnosis present

## 2015-02-04 DIAGNOSIS — J9811 Atelectasis: Secondary | ICD-10-CM | POA: Diagnosis not present

## 2015-02-04 DIAGNOSIS — F419 Anxiety disorder, unspecified: Secondary | ICD-10-CM | POA: Diagnosis present

## 2015-02-04 DIAGNOSIS — J189 Pneumonia, unspecified organism: Secondary | ICD-10-CM | POA: Diagnosis not present

## 2015-02-04 DIAGNOSIS — K440 Diaphragmatic hernia with obstruction, without gangrene: Secondary | ICD-10-CM | POA: Diagnosis not present

## 2015-02-04 DIAGNOSIS — J9601 Acute respiratory failure with hypoxia: Secondary | ICD-10-CM | POA: Diagnosis not present

## 2015-02-04 DIAGNOSIS — A419 Sepsis, unspecified organism: Secondary | ICD-10-CM | POA: Diagnosis present

## 2015-02-04 DIAGNOSIS — B965 Pseudomonas (aeruginosa) (mallei) (pseudomallei) as the cause of diseases classified elsewhere: Secondary | ICD-10-CM | POA: Diagnosis present

## 2015-02-04 DIAGNOSIS — W19XXXA Unspecified fall, initial encounter: Secondary | ICD-10-CM | POA: Diagnosis present

## 2015-02-04 DIAGNOSIS — K449 Diaphragmatic hernia without obstruction or gangrene: Secondary | ICD-10-CM | POA: Diagnosis not present

## 2015-02-04 DIAGNOSIS — J984 Other disorders of lung: Secondary | ICD-10-CM | POA: Diagnosis not present

## 2015-02-04 DIAGNOSIS — R1310 Dysphagia, unspecified: Secondary | ICD-10-CM | POA: Diagnosis present

## 2015-02-04 DIAGNOSIS — R112 Nausea with vomiting, unspecified: Secondary | ICD-10-CM | POA: Diagnosis not present

## 2015-02-04 DIAGNOSIS — K210 Gastro-esophageal reflux disease with esophagitis: Secondary | ICD-10-CM

## 2015-02-04 DIAGNOSIS — I10 Essential (primary) hypertension: Secondary | ICD-10-CM | POA: Diagnosis present

## 2015-02-04 DIAGNOSIS — R269 Unspecified abnormalities of gait and mobility: Secondary | ICD-10-CM | POA: Diagnosis present

## 2015-02-04 DIAGNOSIS — T17990A Other foreign object in respiratory tract, part unspecified in causing asphyxiation, initial encounter: Secondary | ICD-10-CM | POA: Diagnosis present

## 2015-02-04 DIAGNOSIS — M797 Fibromyalgia: Secondary | ICD-10-CM | POA: Diagnosis present

## 2015-02-04 DIAGNOSIS — J90 Pleural effusion, not elsewhere classified: Secondary | ICD-10-CM | POA: Diagnosis present

## 2015-02-04 DIAGNOSIS — K219 Gastro-esophageal reflux disease without esophagitis: Secondary | ICD-10-CM | POA: Diagnosis present

## 2015-02-04 DIAGNOSIS — Z681 Body mass index (BMI) 19 or less, adult: Secondary | ICD-10-CM | POA: Diagnosis not present

## 2015-02-04 DIAGNOSIS — H8109 Meniere's disease, unspecified ear: Secondary | ICD-10-CM | POA: Diagnosis present

## 2015-02-04 DIAGNOSIS — R1312 Dysphagia, oropharyngeal phase: Secondary | ICD-10-CM | POA: Diagnosis not present

## 2015-02-04 DIAGNOSIS — R531 Weakness: Secondary | ICD-10-CM

## 2015-02-04 DIAGNOSIS — K5730 Diverticulosis of large intestine without perforation or abscess without bleeding: Secondary | ICD-10-CM | POA: Diagnosis present

## 2015-02-04 DIAGNOSIS — J690 Pneumonitis due to inhalation of food and vomit: Secondary | ICD-10-CM | POA: Diagnosis not present

## 2015-02-04 DIAGNOSIS — Z801 Family history of malignant neoplasm of trachea, bronchus and lung: Secondary | ICD-10-CM | POA: Diagnosis not present

## 2015-02-04 DIAGNOSIS — Z8249 Family history of ischemic heart disease and other diseases of the circulatory system: Secondary | ICD-10-CM | POA: Diagnosis not present

## 2015-02-04 DIAGNOSIS — F411 Generalized anxiety disorder: Secondary | ICD-10-CM | POA: Diagnosis present

## 2015-02-04 LAB — EXPECTORATED SPUTUM ASSESSMENT W GRAM STAIN, RFLX TO RESP C

## 2015-02-04 LAB — STREP PNEUMONIAE URINARY ANTIGEN: STREP PNEUMO URINARY ANTIGEN: NEGATIVE

## 2015-02-04 LAB — URINE CULTURE
CULTURE: NO GROWTH
Colony Count: NO GROWTH

## 2015-02-04 LAB — PROCALCITONIN: PROCALCITONIN: 0.14 ng/mL

## 2015-02-04 LAB — APTT: aPTT: 37 seconds (ref 24–37)

## 2015-02-04 LAB — POC OCCULT BLOOD, ED: Fecal Occult Bld: NEGATIVE

## 2015-02-04 LAB — LACTIC ACID, PLASMA
LACTIC ACID, VENOUS: 1.7 mmol/L (ref 0.5–2.0)
Lactic Acid, Venous: 1.9 mmol/L (ref 0.5–2.0)

## 2015-02-04 LAB — EXPECTORATED SPUTUM ASSESSMENT W REFEX TO RESP CULTURE

## 2015-02-04 LAB — LIPASE, BLOOD: Lipase: 10 U/L — ABNORMAL LOW (ref 22–51)

## 2015-02-04 MED ORDER — SODIUM CHLORIDE 0.9 % IV SOLN
INTRAVENOUS | Status: DC
  Administered 2015-02-04 – 2015-02-05 (×4): via INTRAVENOUS

## 2015-02-04 MED ORDER — MAGIC MOUTHWASH W/LIDOCAINE
5.0000 mL | Freq: Four times a day (QID) | ORAL | Status: DC | PRN
  Administered 2015-02-04 – 2015-02-09 (×12): 5 mL via ORAL
  Filled 2015-02-04 (×17): qty 5

## 2015-02-04 MED ORDER — ALPRAZOLAM 0.25 MG PO TABS
0.2500 mg | ORAL_TABLET | Freq: Two times a day (BID) | ORAL | Status: DC | PRN
  Administered 2015-02-05 – 2015-02-08 (×6): 0.25 mg via ORAL
  Filled 2015-02-04 (×6): qty 1

## 2015-02-04 MED ORDER — DEXTROSE 5 % IV SOLN
500.0000 mg | Freq: Once | INTRAVENOUS | Status: AC
  Administered 2015-02-04: 500 mg via INTRAVENOUS
  Filled 2015-02-04: qty 500

## 2015-02-04 MED ORDER — HYDROXYZINE HCL 50 MG/ML IM SOLN
25.0000 mg | Freq: Four times a day (QID) | INTRAMUSCULAR | Status: DC | PRN
  Filled 2015-02-04: qty 0.5

## 2015-02-04 MED ORDER — ENSURE ACTIVE HIGH PROTEIN PO LIQD: 1.0000 | Freq: Three times a day (TID) | ORAL | Status: DC

## 2015-02-04 MED ORDER — DEXTROSE 5 % IV SOLN: 1.0000 g | INTRAVENOUS | Status: DC

## 2015-02-04 MED ORDER — IOHEXOL 300 MG/ML  SOLN
50.0000 mL | Freq: Once | INTRAMUSCULAR | Status: AC | PRN
  Administered 2015-02-04: 50 mL via ORAL

## 2015-02-04 MED ORDER — IBUPROFEN 200 MG PO TABS
400.0000 mg | ORAL_TABLET | ORAL | Status: DC | PRN
  Filled 2015-02-04: qty 2

## 2015-02-04 MED ORDER — HYDROCODONE-ACETAMINOPHEN 5-325 MG PO TABS
1.0000 | ORAL_TABLET | Freq: Once | ORAL | Status: AC
  Administered 2015-02-04: 1 via ORAL
  Filled 2015-02-04: qty 1

## 2015-02-04 MED ORDER — ENSURE ENLIVE PO LIQD
237.0000 mL | Freq: Three times a day (TID) | ORAL | Status: DC
  Administered 2015-02-04 – 2015-02-09 (×8): 237 mL via ORAL
  Filled 2015-02-04: qty 237

## 2015-02-04 MED ORDER — PANTOPRAZOLE SODIUM 40 MG PO TBEC
40.0000 mg | DELAYED_RELEASE_TABLET | Freq: Every day | ORAL | Status: DC
  Administered 2015-02-04 – 2015-02-08 (×5): 40 mg via ORAL
  Filled 2015-02-04 (×5): qty 1

## 2015-02-04 MED ORDER — IOHEXOL 300 MG/ML  SOLN
100.0000 mL | Freq: Once | INTRAMUSCULAR | Status: AC | PRN
  Administered 2015-02-04: 80 mL via INTRAVENOUS

## 2015-02-04 MED ORDER — AMLODIPINE BESYLATE 5 MG PO TABS
5.0000 mg | ORAL_TABLET | Freq: Every day | ORAL | Status: DC
  Administered 2015-02-04 – 2015-02-09 (×6): 5 mg via ORAL
  Filled 2015-02-04 (×7): qty 1

## 2015-02-04 MED ORDER — HYDROCODONE-ACETAMINOPHEN 5-325 MG PO TABS
1.0000 | ORAL_TABLET | Freq: Four times a day (QID) | ORAL | Status: DC | PRN
  Administered 2015-02-04 – 2015-02-09 (×15): 1 via ORAL
  Filled 2015-02-04 (×15): qty 1

## 2015-02-04 MED ORDER — DEXTROSE 5 % IV SOLN
1.0000 g | INTRAVENOUS | Status: DC
  Administered 2015-02-05 – 2015-02-06 (×2): 1 g via INTRAVENOUS
  Filled 2015-02-04 (×2): qty 10

## 2015-02-04 MED ORDER — DEXTROSE 5 % IV SOLN
500.0000 mg | INTRAVENOUS | Status: DC
  Administered 2015-02-05 – 2015-02-06 (×2): 500 mg via INTRAVENOUS
  Filled 2015-02-04 (×2): qty 500

## 2015-02-04 MED ORDER — DEXTROSE 5 % IV SOLN
1.0000 g | Freq: Once | INTRAVENOUS | Status: AC
  Administered 2015-02-04: 1 g via INTRAVENOUS
  Filled 2015-02-04: qty 10

## 2015-02-04 MED ORDER — IOHEXOL 300 MG/ML  SOLN
50.0000 mL | Freq: Once | INTRAMUSCULAR | Status: AC | PRN
  Administered 2015-02-04: 50 mL via INTRAVENOUS

## 2015-02-04 MED ORDER — MORPHINE SULFATE 2 MG/ML IJ SOLN
2.0000 mg | Freq: Once | INTRAMUSCULAR | Status: AC
  Administered 2015-02-04: 2 mg via INTRAVENOUS
  Filled 2015-02-04: qty 1

## 2015-02-04 MED ORDER — SODIUM CHLORIDE 0.9 % IV BOLUS (SEPSIS)
1500.0000 mL | Freq: Once | INTRAVENOUS | Status: AC
  Administered 2015-02-04: 1500 mL via INTRAVENOUS

## 2015-02-04 MED ORDER — BENZONATATE 100 MG PO CAPS
100.0000 mg | ORAL_CAPSULE | Freq: Three times a day (TID) | ORAL | Status: DC | PRN
  Administered 2015-02-04 – 2015-02-07 (×6): 100 mg via ORAL
  Filled 2015-02-04 (×6): qty 1

## 2015-02-04 NOTE — Progress Notes (Signed)
Initial Nutrition Assessment  DOCUMENTATION CODES:  Severe malnutrition in context of acute illness/injury, Underweight  INTERVENTION: -Continue Ensure Enlive po BID, each supplement provides 350 kcal and 20 grams of protein - RD will continue to monitor for needs  NUTRITION DIAGNOSIS:  Inadequate oral intake related to nausea, vomiting as evidenced by per patient/family report.  GOAL:  Patient will meet greater than or equal to 90% of their needs  MONITOR:  PO intake, Supplement acceptance, Weight trends, Labs, I & O's  REASON FOR ASSESSMENT:  Malnutrition Screening Tool  ASSESSMENT: 81 y.o. female with PMH of hypertension, GERD, anxiety, fibromyalgia, vertigo, Mnire's disease, insomnia, who presents with cough, chest pain and shortness of breath, fall. Patient reports that she has chronic cough in the past 2 years, which has been getting worse recently. She coughs up streaks amount of blood and brown colored sputum.  Pt seen for MST. BMI indicates underweight status. Pt and family provide information. PTA she had Ensure or Boost at home but, like other PO intakes, was unable to keep them down. For the past 2 weeks she has been unable to keep down food, drinks, or medications and she was having some blood in emesis recently. Son asks about foods that would be best once she is able to keep things down and advised to introduce soft, bland foods and slowly advance as pt is able/willing.  Both are unsure about weight loss with current condition. Unable to determine based on weight hx and neither are unsure of UBW.   Pt unable to meet needs at this time. Physical assessment indicated moderate muscle and fat wasting to all areas. Labs and medications reviewed.  Height:  Ht Readings from Last 1 Encounters:  02/04/15 5\' 8"  (1.727 m)    Weight:  Wt Readings from Last 1 Encounters:  02/04/15 121 lb 4.1 oz (55 kg)    Ideal Body Weight:  63.6 kg (kg)  Wt Readings from Last 10  Encounters:  02/04/15 121 lb 4.1 oz (55 kg)  08/15/13 117 lb (53.071 kg)  07/18/11 120 lb (54.432 kg)    BMI:  Body mass index is 18.44 kg/(m^2).  Estimated Nutritional Needs:  Kcal:  1250-1450  Protein:  50-60 grams  Fluid:  2.5-3 L/day  Skin:  Reviewed, no issues  Diet Order:  Diet regular Room service appropriate?: Yes; Fluid consistency:: Thin  EDUCATION NEEDS:  No education needs identified at this time   Intake/Output Summary (Last 24 hours) at 02/04/15 1527 Last data filed at 02/04/15 1444  Gross per 24 hour  Intake 153.33 ml  Output    700 ml  Net -546.67 ml    Last BM:  PTA   Trenton Gammon, RD, LDN Inpatient Clinical Dietitian Pager # [phone] After hours/weekend pager # [phone]

## 2015-02-04 NOTE — H&P (Signed)
[facility] History and Physical  Nicole Key ZOX:[id number] DOB: Mar 17, 1928 DOA: 02/03/2015  Referring physician: ED physician PCP: [name], MD  Specialists:   Chief Complaint: Cough, chest pain, shortness of breath and chronic fall  HPI: Nicole Key is a 81 y.o. female with PMH of hypertension, GERD, anxiety, fibromyalgia, vertigo, Mnire's disease, insomnia, who presents with cough, chest pain and shortness of breath, fall.  Patient reports that she has chronic cough in the past 2 years, which has been getting worse recently. She coughs up streaks amount of blood and brown colored sputum. She also has pleuritic chest pain and shortness of breath. She has fever and chills at home. And also reports chronic fall in the past 2 years, which has also been worsening recently.  Currently patient denies running nose, ear pain, headaches, abdominal pain, diarrhea, constipation, dysuria, urgency, frequency, hematuria, skin rashes or leg swelling. She has a generalized weakness, but no unilateral weakness, numbness or tingling sensations. No vision change or hearing loss.  In ED, patient was found to have WBCs 38, heart rate 125, temperature 100.9. Chest x-ray showed possible left lower lobe pneumonia with small amount of pleural effusion. CT head is negative for acute intracranial abnormalities, but showed hematoma over left maxilla. CT abdomen/pelvis did not show acute intra-abdominal abnormalities, but showed left lower lobe pneumonia and possible para pneumonic effusion on the left side. Patient is admitted to inpatient for further evaluation and treatment.  Where does patient live?   At home    Can patient participate in ADLs?    None    Review of Systems:   General: has fevers, chills, poor appetite, has fatigue HEENT: no blurry vision, hearing changes or sore throat Pulm: has dyspnea, coughing, no wheezing CV: has chest pain, no palpitations Abd: no nausea, vomiting,  abdominal pain, diarrhea, constipation GU: no dysuria, burning on urination, increased urinary frequency, hematuria  Ext: no leg edema Neuro: no unilateral weakness, numbness, or tingling, no vision change or hearing loss Skin: no rash MSK: No muscle spasm, no deformity, no limitation of range of movement in spin Heme: No easy bruising.  Travel history: No recent long distant travel.  Allergy:  Allergies  Allergen Reactions  . Shellfish Allergy     unknown    Past Medical History  Diagnosis Date  . Insomnia   . Fibromyalgia   . Arthritis   . Meniere's disease 1989  . Hypertension   . Anxiety   . Vertigo   . GERD (gastroesophageal reflux disease)     Past Surgical History  Procedure Laterality Date  . Appendectomy    . Abdominal hysterectomy    . Bilateral total mastectomy with axillary lymph node dissection  1981    Social History:  reports that she has never smoked. She has never used smokeless tobacco. She reports that she does not drink alcohol or use illicit drugs.  Family History:  Family History  Problem Relation Age of Onset  . Aortic aneurysm Mother     Had ruptured aorta  . Lung cancer Father   . Heart disease Father      Prior to Admission medications   Medication Sig Start Date End Date Taking? Authorizing Provider  ALPRAZolam (XANAX) 0.25 MG tablet Take 0.25 mg by mouth 2 (two) times daily as needed (anxiety).  08/09/13  Yes Historical Provider, MD  amLODipine (NORVASC) 5 MG tablet Take 1 tablet (5 mg total) by mouth daily. 08/18/13  Yes Geoffry Paradise, MD  HYDROcodone-acetaminophen (NORCO/VICODIN) 5-325 MG per tablet Take 1 tablet by mouth every 6 (six) hours as needed for moderate pain.   Yes Historical Provider, MD  omeprazole (PRILOSEC) 20 MG capsule Take 20 mg by mouth daily. 07/11/13  Yes Historical Provider, MD  promethazine (PHENERGAN) 25 MG tablet Take 25 mg by mouth every 8 (eight) hours as needed for nausea or vomiting.   Yes Historical  Provider, MD  traMADol (ULTRAM) 50 MG tablet Take 50 mg by mouth every 6 (six) hours as needed (pain). Maximum dose= 8 tablets per day as needed for pain   Yes Historical Provider, MD  Nutritional Supplements (ENSURE ACTIVE HIGH PROTEIN) LIQD Take 1 Can by mouth 3 (three) times daily. Patient not taking: Reported on 11/23/2014 12/17/13   Dierdre Forth, PA-C    Physical Exam: Filed Vitals:   02/03/15 2230 02/04/15 0308 02/04/15 0600 02/04/15 0648  BP:  148/67 129/65 119/56  Pulse: 107 109 98 100  Temp:    98.1 F (36.7 C)  TempSrc:    Oral  Resp: 16 18 17 16   Height:    5\' 8"  (1.727 m)  Weight:    55 kg (121 lb 4.1 oz)  SpO2:  98% 98% 95%   General: Not in acute distress, dry mucous and membrane HEENT:       Eyes: PERRL, EOMI, no scleral icterus.       ENT: No discharge from the ears and nose, no pharynx injection, no tonsillar enlargement.        Neck: No JVD, no bruit, no mass felt. Heme: No neck lymph node enlargement. Cardiac: S1/S2, RRR, No murmurs, No gallops or rubs. Pulm:  No rales, wheezing, rhonchi or rubs. Abd: Soft, nondistended, nontender, no rebound pain, no organomegaly, BS present. Ext: No pitting leg edema bilaterally. 2+DP/PT pulse bilaterally. Musculoskeletal: No joint deformities, No joint redness or warmth, no limitation of ROM in spin. Skin: No rashes.  Neuro: Alert, oriented X3, cranial nerves II-XII grossly intact, muscle strength 4/5 in all extremities, sensation to light touch intact. Brachial reflex 1+ bilaterally. Knee reflex 1+ bilaterally. Negative Babinski's sign. Normal finger to nose test. Psych: Patient is not psychotic, no suicidal or hemocidal ideation.  Labs on Admission:  Basic Metabolic Panel:  Recent Labs Lab 02/03/15 1859  NA 140  K 3.6  CL 98*  CO2 29  GLUCOSE 144*  BUN 15  CREATININE 0.64  CALCIUM 9.0   Liver Function Tests:  Recent Labs Lab 02/03/15 1859  AST 25  ALT 11*  ALKPHOS 100  BILITOT 0.8  PROT 7.2   ALBUMIN 3.4*    Recent Labs Lab 02/04/15 0036  LIPASE 10*   No results for input(s): AMMONIA in the last 168 hours. CBC:  Recent Labs Lab 02/03/15 1859  WBC 38.0*  NEUTROABS 33.1*  HGB 14.2  HCT 43.3  MCV 94.1  PLT 476*   Cardiac Enzymes:  Recent Labs Lab 02/03/15 1916  TROPONINI <0.03    BNP (last 3 results) No results for input(s): BNP in the last 8760 hours.  ProBNP (last 3 results) No results for input(s): PROBNP in the last 8760 hours.  CBG: No results for input(s): GLUCAP in the last 168 hours.  Radiological Exams on Admission: Dg Chest 2 View  02/03/2015   CLINICAL DATA:  81 year old female with history of trauma from a fall 1 week ago. Poor appetite, weakness and lethargy since that time. Emesis.  EXAM: CHEST  2 VIEW  COMPARISON:  Chest x-ray 12/17/2013.  FINDINGS: Lung volumes are low. Opacity at the left lung base may reflect atelectasis and/or consolidation. Small left pleural effusion. Right lung is clear. No evidence of pulmonary edema. Mild diffuse interstitial prominence, similar to numerous prior examinations. Large hiatal hernia. Heart size is normal. Upper mediastinal contours are within normal limits. Atherosclerosis in the thoracic aorta.  IMPRESSION: 1. Atelectasis and/or consolidation in the left lower lobe. Small left-sided pleural effusion. Followup PA and lateral chest X-ray is recommended in 3-4 weeks following trial of antibiotic therapy to ensure resolution and exclude underlying malignancy. 2. Moderate sized hiatal hernia. 3. Atherosclerosis.   Electronically Signed   By: Trudie Reed M.D.   On: 02/03/2015 19:26   Ct Head Wo Contrast  02/03/2015   CLINICAL DATA:  81 year old female with history of trauma from a fall at home 1 week ago with injury to the face. Visible hematoma over the left cheek bone. Weakness and lethargy. Emesis for 1 week.  EXAM: CT HEAD WITHOUT CONTRAST  CT MAXILLOFACIAL WITHOUT CONTRAST  CT CERVICAL SPINE WITHOUT  CONTRAST  TECHNIQUE: Multidetector CT imaging of the head, cervical spine, and maxillofacial structures were performed using the standard protocol without intravenous contrast. Multiplanar CT image reconstructions of the cervical spine and maxillofacial structures were also generated.  COMPARISON:  PET-CT 12/17/2013.  Cervical spine CT 12/17/2013.  FINDINGS: CT HEAD FINDINGS  Patchy and confluent areas of decreased attenuation are noted throughout the deep and periventricular white matter of the cerebral hemispheres bilaterally, compatible with chronic microvascular ischemic disease. No acute displaced skull fractures are identified. No acute intracranial abnormality. Specifically, no evidence of acute post-traumatic intracranial hemorrhage, no definite regions of acute/subacute cerebral ischemia, no focal mass, mass effect, hydrocephalus or abnormal intra or extra-axial fluid collections. The visualized paranasal sinuses and mastoids are well pneumatized.  CT MAXILLOFACIAL FINDINGS  Extensive high attenuation soft tissue swelling overlying the left maxilla, compatible with the reported hematoma. No underlying displaced facial bone fractures. Pterygoid plates are intact. Mandibular condyles are located bilaterally. Bilateral globes and retro-orbital soft tissues are grossly normal in appearance. The patient is edentulous.  CT CERVICAL SPINE FINDINGS  No acute displaced cervical spine fractures. Exaggeration of normal cervical lordosis, likely chronic. Alignment is otherwise anatomic. Mild multilevel degenerative disc disease and facet arthropathy. Visualized portions of the upper thorax are remarkable for scattered areas of mild septal thickening and some bilateral apical pleuroparenchymal thickening, presumably areas of post infectious or inflammatory scarring.  IMPRESSION: 1. Large hematoma overlying the left maxilla. No underlying displaced facial bone fractures. 2. No acute intracranial abnormalities. 3. No  evidence of significant acute traumatic injury to the cervical spine. 4. Chronic microvascular ischemic changes in the cerebral white matter. 5. Mild multilevel degenerative disc disease and cervical spondylosis, as above. 6. Additional incidental findings, as above.   Electronically Signed   By: Trudie Reed M.D.   On: 02/03/2015 21:28   Ct Cervical Spine Wo Contrast  02/03/2015   CLINICAL DATA:  81 year old female with history of trauma from a fall at home 1 week ago with injury to the face. Visible hematoma over the left cheek bone. Weakness and lethargy. Emesis for 1 week.  EXAM: CT HEAD WITHOUT CONTRAST  CT MAXILLOFACIAL WITHOUT CONTRAST  CT CERVICAL SPINE WITHOUT CONTRAST  TECHNIQUE: Multidetector CT imaging of the head, cervical spine, and maxillofacial structures were performed using the standard protocol without intravenous contrast. Multiplanar CT image reconstructions of the cervical spine and maxillofacial structures were also generated.  COMPARISON:  PET-CT 12/17/2013.  Cervical spine CT 12/17/2013.  FINDINGS: CT HEAD FINDINGS  Patchy and confluent areas of decreased attenuation are noted throughout the deep and periventricular white matter of the cerebral hemispheres bilaterally, compatible with chronic microvascular ischemic disease. No acute displaced skull fractures are identified. No acute intracranial abnormality. Specifically, no evidence of acute post-traumatic intracranial hemorrhage, no definite regions of acute/subacute cerebral ischemia, no focal mass, mass effect, hydrocephalus or abnormal intra or extra-axial fluid collections. The visualized paranasal sinuses and mastoids are well pneumatized.  CT MAXILLOFACIAL FINDINGS  Extensive high attenuation soft tissue swelling overlying the left maxilla, compatible with the reported hematoma. No underlying displaced facial bone fractures. Pterygoid plates are intact. Mandibular condyles are located bilaterally. Bilateral globes and  retro-orbital soft tissues are grossly normal in appearance. The patient is edentulous.  CT CERVICAL SPINE FINDINGS  No acute displaced cervical spine fractures. Exaggeration of normal cervical lordosis, likely chronic. Alignment is otherwise anatomic. Mild multilevel degenerative disc disease and facet arthropathy. Visualized portions of the upper thorax are remarkable for scattered areas of mild septal thickening and some bilateral apical pleuroparenchymal thickening, presumably areas of post infectious or inflammatory scarring.  IMPRESSION: 1. Large hematoma overlying the left maxilla. No underlying displaced facial bone fractures. 2. No acute intracranial abnormalities. 3. No evidence of significant acute traumatic injury to the cervical spine. 4. Chronic microvascular ischemic changes in the cerebral white matter. 5. Mild multilevel degenerative disc disease and cervical spondylosis, as above. 6. Additional incidental findings, as above.   Electronically Signed   By: Trudie Reed M.D.   On: 02/03/2015 21:28   Ct Abdomen Pelvis W Contrast  02/04/2015   CLINICAL DATA:  Weakness, nausea and vomiting.  Leukocytosis.  EXAM: CT ABDOMEN AND PELVIS WITH CONTRAST  TECHNIQUE: Multidetector CT imaging of the abdomen and pelvis was performed using the standard protocol following bolus administration of intravenous contrast.  CONTRAST:  76mL OMNIPAQUE IOHEXOL 300 MG/ML  SOLN  COMPARISON:  08/14/2011  FINDINGS: Lower chest: There is a small left pleural effusion. There is consolidation and volume loss in the left lower lobe. There is a large hiatal hernia.  Hepatobiliary: There are a few small cysts in the liver, the largest measuring 5 mm. These are unchanged from 08/14/2011. No significant liver lesion. Gallbladder and bile ducts appear unremarkable.  Pancreas: Normal  Spleen: Normal  Adrenals/Urinary Tract: The adrenals and kidneys are normal in appearance. There is no urinary calculus evident. There is no  hydronephrosis or ureteral dilatation. Collecting systems and ureters appear unremarkable. Urinary bladder is unremarkable.  Stomach/Bowel: Most of the stomach is above the diaphragm. Small bowel is unremarkable. There is extensive colonic diverticulosis without evidence of diverticulitis.  Vascular/Lymphatic: The abdominal aorta is normal in caliber. There is mild atherosclerotic calcification. There is no adenopathy in the abdomen or pelvis.  Reproductive: There is hysterectomy.  No adnexal abnormalities.  Other: No acute inflammatory changes are evident in the abdomen or pelvis. There is no ascites.  Musculoskeletal: There is no significant musculoskeletal lesion. There is moderately severe degenerative disc change at L5-S1  IMPRESSION: 1. Left lower lobe consolidation and volume loss. Small left pleural effusion. Given the clinical history, this could represent pneumonia with a parapneumonic effusion. 2. Sub cm benign hepatic cysts. 3. Large hiatal hernia. 4. Diverticulosis.   Electronically Signed   By: Ellery Plunk M.D.   On: 02/04/2015 03:22   Ct Maxillofacial Wo Cm  02/03/2015   CLINICAL DATA:  81 year old female with history of trauma from a fall  at home 1 week ago with injury to the face. Visible hematoma over the left cheek bone. Weakness and lethargy. Emesis for 1 week.  EXAM: CT HEAD WITHOUT CONTRAST  CT MAXILLOFACIAL WITHOUT CONTRAST  CT CERVICAL SPINE WITHOUT CONTRAST  TECHNIQUE: Multidetector CT imaging of the head, cervical spine, and maxillofacial structures were performed using the standard protocol without intravenous contrast. Multiplanar CT image reconstructions of the cervical spine and maxillofacial structures were also generated.  COMPARISON:  PET-CT 12/17/2013.  Cervical spine CT 12/17/2013.  FINDINGS: CT HEAD FINDINGS  Patchy and confluent areas of decreased attenuation are noted throughout the deep and periventricular white matter of the cerebral hemispheres bilaterally, compatible  with chronic microvascular ischemic disease. No acute displaced skull fractures are identified. No acute intracranial abnormality. Specifically, no evidence of acute post-traumatic intracranial hemorrhage, no definite regions of acute/subacute cerebral ischemia, no focal mass, mass effect, hydrocephalus or abnormal intra or extra-axial fluid collections. The visualized paranasal sinuses and mastoids are well pneumatized.  CT MAXILLOFACIAL FINDINGS  Extensive high attenuation soft tissue swelling overlying the left maxilla, compatible with the reported hematoma. No underlying displaced facial bone fractures. Pterygoid plates are intact. Mandibular condyles are located bilaterally. Bilateral globes and retro-orbital soft tissues are grossly normal in appearance. The patient is edentulous.  CT CERVICAL SPINE FINDINGS  No acute displaced cervical spine fractures. Exaggeration of normal cervical lordosis, likely chronic. Alignment is otherwise anatomic. Mild multilevel degenerative disc disease and facet arthropathy. Visualized portions of the upper thorax are remarkable for scattered areas of mild septal thickening and some bilateral apical pleuroparenchymal thickening, presumably areas of post infectious or inflammatory scarring.  IMPRESSION: 1. Large hematoma overlying the left maxilla. No underlying displaced facial bone fractures. 2. No acute intracranial abnormalities. 3. No evidence of significant acute traumatic injury to the cervical spine. 4. Chronic microvascular ischemic changes in the cerebral white matter. 5. Mild multilevel degenerative disc disease and cervical spondylosis, as above. 6. Additional incidental findings, as above.   Electronically Signed   By: Trudie Reed M.D.   On: 02/03/2015 21:28    EKG: Independently reviewed.  Abnormal findings:  Tachycardia, QTC 477  Assessment/Plan Principal Problem:   CAP (community acquired pneumonia) Active Problems:   Meniere's disease   Unstable  gait   Depression   Sepsis   Anxiety   Essential hypertension   Fall   Generalized weakness   GERD (gastroesophageal reflux disease)  CAP and sepsis; agents cough, shortness of breath and chest pain are most likely caused by CAP. She is septic on admission with a leukocytosis, tachycardia and fever. She is hemodynamically stable on admission. He also has a mild hemoptysis, which is likely due to pneumonia however may need to rule out other possibility, such as lung cancer. She has family history of lung cancer, her father.   - Will admit to Telemetry Bed - IV Rocephin and azithromycine - Mucinex for cough  - Xopenex Neb prn for SOB - Urine legionella and S. pneumococcal antigen - Follow up blood culture x2, sputum culture and respiratory virus panel - will get Procalcitonin and trend lactic acid level per sepsis protocol - IVF: 2.5L of NS bolus in ED, followed by 100 mL per hour of NS - check inr/ptt for hemoptysis - CT chest with contrast  Fall: It is likely multifactorial, including deconditioning, pneumonia, sepsis, Mnire's disease, vertigo, generalized weakness. No signs of stroke. -pt/ot -Treat CAP and sepsis above.  Anxiety: Stable, no suicidal or homicidal ideations. -Continue home medications: Xanax  GERD: -Protonix  HTN:  -Amlodipine   DVT ppx: SCD  Code Status: DNR Family Communication:  Yes, patient's daughter      at bed side Disposition Plan: Admit to inpatient   Date of Service 02/04/2015    Lorretta Harp [facility] Pager [phone]  If 7PM-7AM, please contact night-coverage [email or web address] Password [email or web address] 02/04/2015, 8:29 AM

## 2015-02-04 NOTE — ED Provider Notes (Signed)
CSN: [id number]     Arrival date & time 02/03/15  1807 History   First MD Initiated Contact with Patient 02/03/15 1824     Chief Complaint  Patient presents with  . Weakness  . Emesis      HPI Patient presents to the emergency department after a fall one week ago.  She lives at home with her grandchild and granddaughter.  She does not ambulate much she usually only ambulate a few steps with her walker and transfers to her commode.  She's had abdominal pain associated with eating for a prolonged period time the family reports that her pain seems to be worsening over the past 1-2 weeks.  Family is concerned about her appetite and her ability to keep food and fluids down.  Patient reports that she mainly has bilious vomiting but does note a small amount of blood in it over the past week.  She denies diarrhea.  She denies black stools.  No fevers and chills.  No new cough.  No new medications.  Family reports she is reluctant to visit her primary care physician.   Past Medical History  Diagnosis Date  . Insomnia   . Fibromyalgia   . Arthritis   . Meniere's disease 1989  . Hypertension   . Anxiety   . Vertigo    Past Surgical History  Procedure Laterality Date  . Appendectomy    . Abdominal hysterectomy    . Bilateral total mastectomy with axillary lymph node dissection  1981   History reviewed. No pertinent family history. History  Substance Use Topics  . Smoking status: Never Smoker   . Smokeless tobacco: Never Used  . Alcohol Use: No     Comment: denies alcohol use   OB History    No data available     Review of Systems  All other systems reviewed and are negative.     Allergies  Shellfish allergy  Home Medications   Prior to Admission medications   Medication Sig Start Date End Date Taking? Authorizing Provider  ALPRAZolam (XANAX) 0.25 MG tablet Take 0.25 mg by mouth 2 (two) times daily as needed (anxiety).  08/09/13  Yes Historical Provider, MD  amLODipine  (NORVASC) 5 MG tablet Take 1 tablet (5 mg total) by mouth daily. 08/18/13  Yes Geoffry Paradise, MD  HYDROcodone-acetaminophen (NORCO/VICODIN) 5-325 MG per tablet Take 1 tablet by mouth every 6 (six) hours as needed for moderate pain.   Yes Historical Provider, MD  omeprazole (PRILOSEC) 20 MG capsule Take 20 mg by mouth daily. 07/11/13  Yes Historical Provider, MD  promethazine (PHENERGAN) 25 MG tablet Take 25 mg by mouth every 8 (eight) hours as needed for nausea or vomiting.   Yes Historical Provider, MD  traMADol (ULTRAM) 50 MG tablet Take 50 mg by mouth every 6 (six) hours as needed (pain). Maximum dose= 8 tablets per day as needed for pain   Yes Historical Provider, MD  Nutritional Supplements (ENSURE ACTIVE HIGH PROTEIN) LIQD Take 1 Can by mouth 3 (three) times daily. Patient not taking: Reported on 11/23/2014 12/17/13   Dahlia Client Muthersbaugh, PA-C   BP 149/75 mmHg  Pulse 107  Temp(Src) 100.9 F (38.3 C) (Rectal)  Resp 16  Ht 5\' 6"  (1.676 m)  Wt 100 lb (45.36 kg)  BMI 16.15 kg/m2  SpO2 99% Physical Exam  Constitutional: She is oriented to person, place, and time. She appears well-developed and well-nourished. No distress.  HENT:  Head: Normocephalic and atraumatic.  Eyes: EOM are  normal.  Neck: Normal range of motion.  Cardiovascular: Normal rate, regular rhythm and normal heart sounds.   Pulmonary/Chest: Effort normal and breath sounds normal.  Abdominal: Soft. She exhibits no distension. There is no tenderness.  Musculoskeletal: Normal range of motion.  Neurological: She is alert and oriented to person, place, and time.  Skin: Skin is warm and dry.  Psychiatric: She has a normal mood and affect. Judgment normal.  Nursing note and vitals reviewed.   ED Course  Procedures (including critical care time) Labs Review Labs Reviewed  CBC WITH DIFFERENTIAL/PLATELET - Abnormal; Notable for the following:    WBC 38.0 (*)    Platelets 476 (*)    Neutrophils Relative % 87 (*)     Lymphocytes Relative 5 (*)    Neutro Abs 33.1 (*)    Monocytes Absolute 3.0 (*)    All other components within normal limits  COMPREHENSIVE METABOLIC PANEL - Abnormal; Notable for the following:    Chloride 98 (*)    Glucose, Bld 144 (*)    Albumin 3.4 (*)    ALT 11 (*)    All other components within normal limits  URINALYSIS, ROUTINE W REFLEX MICROSCOPIC (NOT AT [facility]) - Abnormal; Notable for the following:    Hgb urine dipstick SMALL (*)    Protein, ur 30 (*)    All other components within normal limits  URINE CULTURE  PROTIME-INR  TROPONIN I  URINE MICROSCOPIC-ADD ON  LIPASE, BLOOD  OCCULT BLOOD X 1 CARD TO LAB, STOOL  POC OCCULT BLOOD, ED  POC OCCULT BLOOD, ED  TYPE AND SCREEN  ABO/RH    Imaging Review Dg Chest 2 View  02/03/2015   CLINICAL DATA:  81 year old female with history of trauma from a fall 1 week ago. Poor appetite, weakness and lethargy since that time. Emesis.  EXAM: CHEST  2 VIEW  COMPARISON:  Chest x-ray 12/17/2013.  FINDINGS: Lung volumes are low. Opacity at the left lung base may reflect atelectasis and/or consolidation. Small left pleural effusion. Right lung is clear. No evidence of pulmonary edema. Mild diffuse interstitial prominence, similar to numerous prior examinations. Large hiatal hernia. Heart size is normal. Upper mediastinal contours are within normal limits. Atherosclerosis in the thoracic aorta.  IMPRESSION: 1. Atelectasis and/or consolidation in the left lower lobe. Small left-sided pleural effusion. Followup PA and lateral chest X-ray is recommended in 3-4 weeks following trial of antibiotic therapy to ensure resolution and exclude underlying malignancy. 2. Moderate sized hiatal hernia. 3. Atherosclerosis.   Electronically Signed   By: Trudie Reed M.D.   On: 02/03/2015 19:26   Ct Head Wo Contrast  02/03/2015   CLINICAL DATA:  81 year old female with history of trauma from a fall at home 1 week ago with injury to the face. Visible hematoma over  the left cheek bone. Weakness and lethargy. Emesis for 1 week.  EXAM: CT HEAD WITHOUT CONTRAST  CT MAXILLOFACIAL WITHOUT CONTRAST  CT CERVICAL SPINE WITHOUT CONTRAST  TECHNIQUE: Multidetector CT imaging of the head, cervical spine, and maxillofacial structures were performed using the standard protocol without intravenous contrast. Multiplanar CT image reconstructions of the cervical spine and maxillofacial structures were also generated.  COMPARISON:  PET-CT 12/17/2013.  Cervical spine CT 12/17/2013.  FINDINGS: CT HEAD FINDINGS  Patchy and confluent areas of decreased attenuation are noted throughout the deep and periventricular white matter of the cerebral hemispheres bilaterally, compatible with chronic microvascular ischemic disease. No acute displaced skull fractures are identified. No acute intracranial abnormality. Specifically,  no evidence of acute post-traumatic intracranial hemorrhage, no definite regions of acute/subacute cerebral ischemia, no focal mass, mass effect, hydrocephalus or abnormal intra or extra-axial fluid collections. The visualized paranasal sinuses and mastoids are well pneumatized.  CT MAXILLOFACIAL FINDINGS  Extensive high attenuation soft tissue swelling overlying the left maxilla, compatible with the reported hematoma. No underlying displaced facial bone fractures. Pterygoid plates are intact. Mandibular condyles are located bilaterally. Bilateral globes and retro-orbital soft tissues are grossly normal in appearance. The patient is edentulous.  CT CERVICAL SPINE FINDINGS  No acute displaced cervical spine fractures. Exaggeration of normal cervical lordosis, likely chronic. Alignment is otherwise anatomic. Mild multilevel degenerative disc disease and facet arthropathy. Visualized portions of the upper thorax are remarkable for scattered areas of mild septal thickening and some bilateral apical pleuroparenchymal thickening, presumably areas of post infectious or inflammatory scarring.   IMPRESSION: 1. Large hematoma overlying the left maxilla. No underlying displaced facial bone fractures. 2. No acute intracranial abnormalities. 3. No evidence of significant acute traumatic injury to the cervical spine. 4. Chronic microvascular ischemic changes in the cerebral white matter. 5. Mild multilevel degenerative disc disease and cervical spondylosis, as above. 6. Additional incidental findings, as above.   Electronically Signed   By: Trudie Reed M.D.   On: 02/03/2015 21:28   Ct Cervical Spine Wo Contrast  02/03/2015   CLINICAL DATA:  81 year old female with history of trauma from a fall at home 1 week ago with injury to the face. Visible hematoma over the left cheek bone. Weakness and lethargy. Emesis for 1 week.  EXAM: CT HEAD WITHOUT CONTRAST  CT MAXILLOFACIAL WITHOUT CONTRAST  CT CERVICAL SPINE WITHOUT CONTRAST  TECHNIQUE: Multidetector CT imaging of the head, cervical spine, and maxillofacial structures were performed using the standard protocol without intravenous contrast. Multiplanar CT image reconstructions of the cervical spine and maxillofacial structures were also generated.  COMPARISON:  PET-CT 12/17/2013.  Cervical spine CT 12/17/2013.  FINDINGS: CT HEAD FINDINGS  Patchy and confluent areas of decreased attenuation are noted throughout the deep and periventricular white matter of the cerebral hemispheres bilaterally, compatible with chronic microvascular ischemic disease. No acute displaced skull fractures are identified. No acute intracranial abnormality. Specifically, no evidence of acute post-traumatic intracranial hemorrhage, no definite regions of acute/subacute cerebral ischemia, no focal mass, mass effect, hydrocephalus or abnormal intra or extra-axial fluid collections. The visualized paranasal sinuses and mastoids are well pneumatized.  CT MAXILLOFACIAL FINDINGS  Extensive high attenuation soft tissue swelling overlying the left maxilla, compatible with the reported hematoma.  No underlying displaced facial bone fractures. Pterygoid plates are intact. Mandibular condyles are located bilaterally. Bilateral globes and retro-orbital soft tissues are grossly normal in appearance. The patient is edentulous.  CT CERVICAL SPINE FINDINGS  No acute displaced cervical spine fractures. Exaggeration of normal cervical lordosis, likely chronic. Alignment is otherwise anatomic. Mild multilevel degenerative disc disease and facet arthropathy. Visualized portions of the upper thorax are remarkable for scattered areas of mild septal thickening and some bilateral apical pleuroparenchymal thickening, presumably areas of post infectious or inflammatory scarring.  IMPRESSION: 1. Large hematoma overlying the left maxilla. No underlying displaced facial bone fractures. 2. No acute intracranial abnormalities. 3. No evidence of significant acute traumatic injury to the cervical spine. 4. Chronic microvascular ischemic changes in the cerebral white matter. 5. Mild multilevel degenerative disc disease and cervical spondylosis, as above. 6. Additional incidental findings, as above.   Electronically Signed   By: Trudie Reed M.D.   On: 02/03/2015 21:28  Ct Maxillofacial Wo Cm  02/03/2015   CLINICAL DATA:  81 year old female with history of trauma from a fall at home 1 week ago with injury to the face. Visible hematoma over the left cheek bone. Weakness and lethargy. Emesis for 1 week.  EXAM: CT HEAD WITHOUT CONTRAST  CT MAXILLOFACIAL WITHOUT CONTRAST  CT CERVICAL SPINE WITHOUT CONTRAST  TECHNIQUE: Multidetector CT imaging of the head, cervical spine, and maxillofacial structures were performed using the standard protocol without intravenous contrast. Multiplanar CT image reconstructions of the cervical spine and maxillofacial structures were also generated.  COMPARISON:  PET-CT 12/17/2013.  Cervical spine CT 12/17/2013.  FINDINGS: CT HEAD FINDINGS  Patchy and confluent areas of decreased attenuation are noted  throughout the deep and periventricular white matter of the cerebral hemispheres bilaterally, compatible with chronic microvascular ischemic disease. No acute displaced skull fractures are identified. No acute intracranial abnormality. Specifically, no evidence of acute post-traumatic intracranial hemorrhage, no definite regions of acute/subacute cerebral ischemia, no focal mass, mass effect, hydrocephalus or abnormal intra or extra-axial fluid collections. The visualized paranasal sinuses and mastoids are well pneumatized.  CT MAXILLOFACIAL FINDINGS  Extensive high attenuation soft tissue swelling overlying the left maxilla, compatible with the reported hematoma. No underlying displaced facial bone fractures. Pterygoid plates are intact. Mandibular condyles are located bilaterally. Bilateral globes and retro-orbital soft tissues are grossly normal in appearance. The patient is edentulous.  CT CERVICAL SPINE FINDINGS  No acute displaced cervical spine fractures. Exaggeration of normal cervical lordosis, likely chronic. Alignment is otherwise anatomic. Mild multilevel degenerative disc disease and facet arthropathy. Visualized portions of the upper thorax are remarkable for scattered areas of mild septal thickening and some bilateral apical pleuroparenchymal thickening, presumably areas of post infectious or inflammatory scarring.  IMPRESSION: 1. Large hematoma overlying the left maxilla. No underlying displaced facial bone fractures. 2. No acute intracranial abnormalities. 3. No evidence of significant acute traumatic injury to the cervical spine. 4. Chronic microvascular ischemic changes in the cerebral white matter. 5. Mild multilevel degenerative disc disease and cervical spondylosis, as above. 6. Additional incidental findings, as above.   Electronically Signed   By: Trudie Reed M.D.   On: 02/03/2015 21:28  I personally reviewed the imaging tests through PACS system I reviewed available ER/hospitalization  records through the EMR    EKG Interpretation   Date/Time:  Wednesday February 03 2015 19:21:19 EDT Ventricular Rate:  125 PR Interval:  146 QRS Duration: 88 QT Interval:  331 QTC Calculation: 477 R Axis:   69 Text Interpretation:  Sinus tachycardia Probable left atrial enlargement  Borderline repolarization abnormality not Confirmed by Allanna Bresee  MD, Jeniece Hannis  ([id number]) on 02/03/2015 10:17:49 PM      MDM   Final diagnoses:  None    White blood cell count noted.  No clear etiology.  Heart rate improving with IV fluids.  Labs otherwise reassuring.  Patient continues to describe some upper abdominal discomfort.  CT imaging will be performed to further evaluate.  Care to Dr Gwendolyn Grant to follow up on CT imaging and disposition.  At some point patient will definite plain home health services and physical therapy.  Family reports she is hesitant to be anywhere but her house    Azalia Bilis, MD 02/04/15 0010

## 2015-02-04 NOTE — Evaluation (Signed)
Physical Therapy Evaluation Patient Details Name: Nicole Key MRN: [id number] DOB: 05-Sep-1927 Today's Date: 02/04/2015   History of Present Illness  81 y.o. female admitted with Pna.  PMH of hypertension, GERD, anxiety, fibromyalgia, vertigo, Mnire's disease, fibroymyalgia.  Clinical Impression  On eval, pt required Mod assist for mobility-able to perform stand pivot x 2 from bed<>recliner with RW. Very weak. Remains at risk for falls. Mod encouragement for participation on today. Recommend ST rehab at SNF, at this time.     Follow Up Recommendations SNF;Supervision/Assistance - 24 hour    Equipment Recommendations  Rolling walker with 5" wheels    Recommendations for Other Services OT consult     Precautions / Restrictions Precautions Precautions: Fall Precaution Comments: droplet Restrictions Weight Bearing Restrictions: No      Mobility  Bed Mobility Overal bed mobility: Needs Assistance Bed Mobility: Supine to Sit;Sit to Supine     Supine to sit: Mod assist;HOB elevated Sit to supine: Mod assist;HOB elevated   General bed mobility comments: Assist for trunk and bil LEs. Increased time. Utilized bedpad for scooting, positioning.   Transfers Overall transfer level: Needs assistance Equipment used: Rolling walker (2 wheeled) Transfers: Sit to/from [employer] Sit to Stand: Mod assist Stand pivot transfers: Mod assist       General transfer comment: Assist to rise, stabilize, control descent, maneuver with RW. Stand pivot x 2, bed<>recliner with RW.   Ambulation/Gait             General Gait Details: NT-too weak  Stairs            Wheelchair Mobility    Modified Rankin (Stroke Patients Only)       Balance Overall balance assessment: Needs assistance;History of Falls         Standing balance support: Bilateral upper extremity supported;During functional activity Standing balance-Leahy Scale: Poor                                Pertinent Vitals/Pain Pain Assessment: Faces Pain Score: 4  Faces Pain Scale: Hurts even more Pain Location: chest, abdomen Pain Descriptors / Indicators: Sore Pain Intervention(s): Limited activity within patient's tolerance;Repositioned;Monitored during session    Home Living Family/patient expects to be discharged to:: Private residence Living Arrangements: Children Available Help at Discharge: Family Type of Home: House Home Access: Stairs to enter Entrance Stairs-Rails: Right Entrance Stairs-Number of Steps: 3 Home Layout: Two level Home Equipment: Environmental consultant - 2 wheels;Bedside commode      Prior Function Level of Independence: Independent               Hand Dominance        Extremity/Trunk Assessment   Upper Extremity Assessment: Defer to OT evaluation           Lower Extremity Assessment: Generalized weakness      Cervical / Trunk Assessment: Kyphotic  Communication   Communication: No difficulties  Cognition Arousal/Alertness: Awake/alert Behavior During Therapy: Flat affect Overall Cognitive Status: Within Functional Limits for tasks assessed                      General Comments      Exercises        Assessment/Plan    PT Assessment Patient needs continued PT services  PT Diagnosis Difficulty walking;Generalized weakness;Acute pain   PT Problem List Decreased strength;Decreased activity tolerance;Decreased balance;Decreased mobility;Decreased knowledge of use of DME;Pain  PT Treatment Interventions DME instruction;Gait training;Functional mobility training;Therapeutic activities;Therapeutic exercise;Patient/family education;Balance training   PT Goals (Current goals can be found in the Care Plan section) Acute Rehab PT Goals Patient Stated Goal: none stated PT Goal Formulation: With patient Time For Goal Achievement: 02/18/15 Potential to Achieve Goals: Good    Frequency Min 3X/week   Barriers to  discharge        Co-evaluation               End of Session Equipment Utilized During Treatment: Gait belt Activity Tolerance: Patient limited by fatigue Patient left: in bed;with call bell/phone within reach;with nursing/sitter in room           Time: 4098-1191 PT Time Calculation (min) (ACUTE ONLY): 18 min   Charges:   PT Evaluation $Initial PT Evaluation Tier I: 1 Procedure     PT G Codes:        Rebeca Alert, MPT Pager: [phone]

## 2015-02-04 NOTE — Evaluation (Signed)
Occupational Therapy Evaluation Patient Details Name: Nicole Key MRN: [id number] DOB: Mar 22, 1928 Today's Date: 02/04/2015    History of Present Illness 81 y.o. female with PMH of hypertension, GERD, anxiety, fibromyalgia, vertigo, Mnire's disease, insomnia, who presents with cough, chest pain and shortness of breath, fall.   Clinical Impression   Pt admitted with cough. Pt currently with functional limitations due to the deficits listed below (see OT Problem List).  Pt will benefit from skilled OT to increase their safety and independence with ADL and functional mobility for ADL to facilitate discharge to venue listed below.      Follow Up Recommendations  Home health OT;SNF;Supervision/Assistance - 24 hour;Supervision - Intermittent    Equipment Recommendations  Other (comment)    Recommendations for Other Services       Precautions / Restrictions Precautions Precautions: Fall      Mobility Bed Mobility Overal bed mobility: Needs Assistance Bed Mobility: Supine to Sit     Supine to sit: Mod assist        Transfers Overall transfer level: Needs assistance Equipment used: 1 person hand held assist Transfers: Sit to/from Stand Sit to Stand: Mod assist         General transfer comment: VC for hand placmement    Balance                                            ADL Overall ADL's : Needs assistance/impaired     Grooming: Wash/dry face;Sitting;Minimal assistance   Upper Body Bathing: Minimal assitance;Sitting   Lower Body Bathing: Maximal assistance;Sit to/from stand   Upper Body Dressing : Minimal assistance;Sitting   Lower Body Dressing: Maximal assistance;Sit to/from stand   Toilet Transfer: Moderate assistance Toilet Transfer Details (indicate cue type and reason): sit to stand Toileting- Clothing Manipulation and Hygiene: Maximal assistance;Sit to/from stand                         Pertinent Vitals/Pain Pain  Assessment: 0-10 Pain Score: 4  Pain Location: stomach from coughing Pain Descriptors / Indicators: Sore Pain Intervention(s): Limited activity within patient's tolerance;Monitored during session;Repositioned     Hand Dominance     Extremity/Trunk Assessment Upper Extremity Assessment Upper Extremity Assessment: Generalized weakness           Communication Communication Communication: No difficulties   Cognition Arousal/Alertness: Awake/alert Behavior During Therapy: Flat affect Overall Cognitive Status: Within Functional Limits for tasks assessed                     General Comments       Exercises       Shoulder Instructions      Home Living Family/patient expects to be discharged to:: Private residence Living Arrangements: Children Available Help at Discharge: Family Type of Home: House Home Access: Stairs to enter Secretary/administrator of Steps: 3 Entrance Stairs-Rails: Right Home Layout: Two level Alternate Level Stairs-Number of Steps: pts room is upstairs   Bathroom Shower/Tub: Producer, television/film/video: Standard     Home Equipment: Environmental consultant - 2 wheels;Bedside commode          Prior Functioning/Environment Level of Independence: Independent             OT Diagnosis: Generalized weakness   OT Problem List: Decreased strength;Decreased activity tolerance;Impaired balance (sitting and/or standing);Decreased safety awareness  OT Treatment/Interventions: Self-care/ADL training;DME and/or AE instruction;Patient/family education    OT Goals(Current goals can be found in the care plan section) Acute Rehab OT Goals Patient Stated Goal: get back to bed room OT Goal Formulation: With patient Time For Goal Achievement: 02/18/15 Potential to Achieve Goals: Good ADL Goals Pt Will Perform Grooming: with supervision;sitting Pt Will Perform Lower Body Dressing: with min assist;sit to/from stand Pt Will Transfer to Toilet: with  supervision;bedside commode Pt Will Perform Toileting - Clothing Manipulation and hygiene: with supervision;sit to/from stand  OT Frequency: Min 2X/week   Barriers to D/C: Decreased caregiver support             End of Session Nurse Communication: Mobility status  Activity Tolerance: Patient limited by fatigue Patient left: in bed;with call bell/phone within reach;with family/visitor present   Time: 1133-1201 OT Time Calculation (min): 28 min Charges:  OT General Charges $OT Visit: 1 Procedure OT Evaluation $Initial OT Evaluation Tier I: 1 Procedure OT Treatments $Self Care/Home Management : 8-22 mins G-Codes:    Einar Crow D 02/23/2015, 12:02 PM

## 2015-02-04 NOTE — Progress Notes (Signed)
PT demonstrated verbal and hands on understanding of Flutter device- PT has a strong cough.

## 2015-02-04 NOTE — ED Notes (Signed)
Pt is awake and alert with family.  No acute distress noted and no further needs expressed at this time.

## 2015-02-04 NOTE — Progress Notes (Addendum)
[facility] PROGRESS NOTE  Nicole Key ZOX:[id number] DOB: 08-Aug-1928 DOA: 02/03/2015 PCP: [name], MD  Same Day Note  Assessment/Plan: CAP and sepsis;  - Continue on IV Rocephin and azithromycin - Mucinex for cough  - Xopenex Neb prn for SOB - Urine legionella and S. pneumococcal antigen - Follow up blood culture x2, sputum culture and respiratory virus panel, pending - CT chest with contrast with B pleural effusions and atelectasis vs airspace disease - Obstruction noted in the small airways mainly in the LLL which may be due to mucus plugging. Radiology recs for ?bronchoscopy to r/o lesion. Will discuss with Pulm in AM  Fall:  -pt/ot -Treat CAP and sepsis above. - Stable  Anxiety:  -Stable, no suicidal or homicidal ideations. -Continue home medications: Xanax   GERD: -Protonix  HTN:  -Amlodipine -Stable  DVT ppx: SCD  Code Status: DNR Family Communication: Pt in room, discussed  Disposition Plan: Pending   Consultants:    Procedures:    Antibiotics:  Rocephin 6/8>>>  Azithromycin 6/8>>>  HPI/Subjective: Complains of sore throat   Objective: Filed Vitals:   02/04/15 0648 02/04/15 0909 02/04/15 1125 02/04/15 1442  BP: 119/56 143/55 110/53 156/64  Pulse: 100  83 98  Temp: 98.1 F (36.7 C) 98.5 F (36.9 C) 97.8 F (36.6 C) 98.7 F (37.1 C)  TempSrc: Oral Oral Oral Oral  Resp: 16 18 20 20   Height: 5\' 8"  (1.727 m)     Weight: 55 kg (121 lb 4.1 oz)     SpO2: 95% 95% 93% 95%    Intake/Output Summary (Last 24 hours) at 02/04/15 1809 Last data filed at 02/04/15 1802  Gross per 24 hour  Intake 1256.66 ml  Output    700 ml  Net 556.66 ml   Filed Weights   02/03/15 1828 02/04/15 0648  Weight: 45.36 kg (100 lb) 55 kg (121 lb 4.1 oz)    Exam:   General:  Awake, in nad  Cardiovascular: regular, s1, s2  Respiratory: normal resp effort, no wheezing  Abdomen: soft, nondistended  Musculoskeletal: perfused, no clubbing    Data Reviewed: Basic Metabolic Panel:  Recent Labs Lab 02/03/15 1859  NA 140  K 3.6  CL 98*  CO2 29  GLUCOSE 144*  BUN 15  CREATININE 0.64  CALCIUM 9.0   Liver Function Tests:  Recent Labs Lab 02/03/15 1859  AST 25  ALT 11*  ALKPHOS 100  BILITOT 0.8  PROT 7.2  ALBUMIN 3.4*    Recent Labs Lab 02/04/15 0036  LIPASE 10*   No results for input(s): AMMONIA in the last 168 hours. CBC:  Recent Labs Lab 02/03/15 1859  WBC 38.0*  NEUTROABS 33.1*  HGB 14.2  HCT 43.3  MCV 94.1  PLT 476*   Cardiac Enzymes:  Recent Labs Lab 02/03/15 1916  TROPONINI <0.03   BNP (last 3 results) No results for input(s): BNP in the last 8760 hours.  ProBNP (last 3 results) No results for input(s): PROBNP in the last 8760 hours.  CBG: No results for input(s): GLUCAP in the last 168 hours.  Recent Results (from the past 240 hour(s))  Culture, sputum-assessment     Status: None   Collection Time: 02/04/15  8:30 AM  Result Value Ref Range Status   Specimen Description SPUTUM  Final   Special Requests NONE  Final   Sputum evaluation   Final    THIS SPECIMEN IS ACCEPTABLE. RESPIRATORY CULTURE REPORT TO FOLLOW.   Report Status 02/04/2015 FINAL  Final     Studies: Dg Chest 2 View  02/03/2015   CLINICAL DATA:  81 year old female with history of trauma from a fall 1 week ago. Poor appetite, weakness and lethargy since that time. Emesis.  EXAM: CHEST  2 VIEW  COMPARISON:  Chest x-ray 12/17/2013.  FINDINGS: Lung volumes are low. Opacity at the left lung base may reflect atelectasis and/or consolidation. Small left pleural effusion. Right lung is clear. No evidence of pulmonary edema. Mild diffuse interstitial prominence, similar to numerous prior examinations. Large hiatal hernia. Heart size is normal. Upper mediastinal contours are within normal limits. Atherosclerosis in the thoracic aorta.  IMPRESSION: 1. Atelectasis and/or consolidation in the left lower lobe. Small left-sided  pleural effusion. Followup PA and lateral chest X-ray is recommended in 3-4 weeks following trial of antibiotic therapy to ensure resolution and exclude underlying malignancy. 2. Moderate sized hiatal hernia. 3. Atherosclerosis.   Electronically Signed   By: Trudie Reed M.D.   On: 02/03/2015 19:26   Ct Head Wo Contrast  02/03/2015   CLINICAL DATA:  81 year old female with history of trauma from a fall at home 1 week ago with injury to the face. Visible hematoma over the left cheek bone. Weakness and lethargy. Emesis for 1 week.  EXAM: CT HEAD WITHOUT CONTRAST  CT MAXILLOFACIAL WITHOUT CONTRAST  CT CERVICAL SPINE WITHOUT CONTRAST  TECHNIQUE: Multidetector CT imaging of the head, cervical spine, and maxillofacial structures were performed using the standard protocol without intravenous contrast. Multiplanar CT image reconstructions of the cervical spine and maxillofacial structures were also generated.  COMPARISON:  PET-CT 12/17/2013.  Cervical spine CT 12/17/2013.  FINDINGS: CT HEAD FINDINGS  Patchy and confluent areas of decreased attenuation are noted throughout the deep and periventricular white matter of the cerebral hemispheres bilaterally, compatible with chronic microvascular ischemic disease. No acute displaced skull fractures are identified. No acute intracranial abnormality. Specifically, no evidence of acute post-traumatic intracranial hemorrhage, no definite regions of acute/subacute cerebral ischemia, no focal mass, mass effect, hydrocephalus or abnormal intra or extra-axial fluid collections. The visualized paranasal sinuses and mastoids are well pneumatized.  CT MAXILLOFACIAL FINDINGS  Extensive high attenuation soft tissue swelling overlying the left maxilla, compatible with the reported hematoma. No underlying displaced facial bone fractures. Pterygoid plates are intact. Mandibular condyles are located bilaterally. Bilateral globes and retro-orbital soft tissues are grossly normal in  appearance. The patient is edentulous.  CT CERVICAL SPINE FINDINGS  No acute displaced cervical spine fractures. Exaggeration of normal cervical lordosis, likely chronic. Alignment is otherwise anatomic. Mild multilevel degenerative disc disease and facet arthropathy. Visualized portions of the upper thorax are remarkable for scattered areas of mild septal thickening and some bilateral apical pleuroparenchymal thickening, presumably areas of post infectious or inflammatory scarring.  IMPRESSION: 1. Large hematoma overlying the left maxilla. No underlying displaced facial bone fractures. 2. No acute intracranial abnormalities. 3. No evidence of significant acute traumatic injury to the cervical spine. 4. Chronic microvascular ischemic changes in the cerebral white matter. 5. Mild multilevel degenerative disc disease and cervical spondylosis, as above. 6. Additional incidental findings, as above.   Electronically Signed   By: Trudie Reed M.D.   On: 02/03/2015 21:28   Ct Chest W Contrast  02/04/2015   CLINICAL DATA:  Hemoptysis  EXAM: CT CHEST WITH CONTRAST  TECHNIQUE: Multidetector CT imaging of the chest was performed during intravenous contrast administration.  CONTRAST:  50mL OMNIPAQUE IOHEXOL 300 MG/ML  SOLN  COMPARISON:  None.  FINDINGS: Tiny hypodensity in the  right lobe of the thyroid gland and image 8.  10 mm Schnitzer axis diameter precarinal node on image 22. There are enhancing soft tissue densities below the carina on image 26 which may represent a conglomeration of small lymph nodes. Pohlman axis diameter is 9 mm.  There is mild wall thickening of the entire length of the esophagus. No mediastinal emphysema. There is fluid density within the posterior and lower mediastinum adjacent to the hiatal hernia.  Large hiatal hernia containing much of the stomach is present.  Bilateral small pleural effusions.  No pneumothorax.  Left lower lobe consolidation versus collapse is present. This is associated with  intermittent obstruction of the central left lower lobe airways. It is difficult to determine if this is due to mucous plugging or an endobronchial lesion. There is some volume loss at the right lung base. There may be some material within the right lower lobe airways. Ground-glass opacities in the right upper lobe are noted. The largest is 2.7 x 2.0 cm.  Stable mild T10 compression compare with prior CT abdominal imaging. Healed or healing left posterior rib fractures are present.  Stable appearance of the abdomen compared yesterday.  IMPRESSION: Bilateral pleural effusions and bibasilar atelectasis versus airspace disease as described. There is obstruction of the small airways particularly in the left lower lobe. This may be due to mucous plugging, however endobronchial lesion is not excluded. Bronchoscopy may be helpful.  Ground-glass opacities in the right upper lobe as described. This is a nonspecific finding. Followup in 3 months is recommended by CT to ensure resolution.  Large hiatal hernia  There is wall thickening of the esophagus. There is also fluid density within the mediastinum adjacent to the esophagus. There is no emphysema. Inflammatory process is not excluded.  Mild mediastinal adenopathy. Inflammatory and neoplastic etiology are considerations. Follow-up CT in 3 months is recommended to ensure resolution. Alternatively, PET-CT can be performed.   Electronically Signed   By: Jolaine Click M.D.   On: 02/04/2015 11:24   Ct Cervical Spine Wo Contrast  02/03/2015   CLINICAL DATA:  81 year old female with history of trauma from a fall at home 1 week ago with injury to the face. Visible hematoma over the left cheek bone. Weakness and lethargy. Emesis for 1 week.  EXAM: CT HEAD WITHOUT CONTRAST  CT MAXILLOFACIAL WITHOUT CONTRAST  CT CERVICAL SPINE WITHOUT CONTRAST  TECHNIQUE: Multidetector CT imaging of the head, cervical spine, and maxillofacial structures were performed using the standard protocol  without intravenous contrast. Multiplanar CT image reconstructions of the cervical spine and maxillofacial structures were also generated.  COMPARISON:  PET-CT 12/17/2013.  Cervical spine CT 12/17/2013.  FINDINGS: CT HEAD FINDINGS  Patchy and confluent areas of decreased attenuation are noted throughout the deep and periventricular white matter of the cerebral hemispheres bilaterally, compatible with chronic microvascular ischemic disease. No acute displaced skull fractures are identified. No acute intracranial abnormality. Specifically, no evidence of acute post-traumatic intracranial hemorrhage, no definite regions of acute/subacute cerebral ischemia, no focal mass, mass effect, hydrocephalus or abnormal intra or extra-axial fluid collections. The visualized paranasal sinuses and mastoids are well pneumatized.  CT MAXILLOFACIAL FINDINGS  Extensive high attenuation soft tissue swelling overlying the left maxilla, compatible with the reported hematoma. No underlying displaced facial bone fractures. Pterygoid plates are intact. Mandibular condyles are located bilaterally. Bilateral globes and retro-orbital soft tissues are grossly normal in appearance. The patient is edentulous.  CT CERVICAL SPINE FINDINGS  No acute displaced cervical spine fractures. Exaggeration of  normal cervical lordosis, likely chronic. Alignment is otherwise anatomic. Mild multilevel degenerative disc disease and facet arthropathy. Visualized portions of the upper thorax are remarkable for scattered areas of mild septal thickening and some bilateral apical pleuroparenchymal thickening, presumably areas of post infectious or inflammatory scarring.  IMPRESSION: 1. Large hematoma overlying the left maxilla. No underlying displaced facial bone fractures. 2. No acute intracranial abnormalities. 3. No evidence of significant acute traumatic injury to the cervical spine. 4. Chronic microvascular ischemic changes in the cerebral white matter. 5. Mild  multilevel degenerative disc disease and cervical spondylosis, as above. 6. Additional incidental findings, as above.   Electronically Signed   By: Trudie Reed M.D.   On: 02/03/2015 21:28   Ct Abdomen Pelvis W Contrast  02/04/2015   CLINICAL DATA:  Weakness, nausea and vomiting.  Leukocytosis.  EXAM: CT ABDOMEN AND PELVIS WITH CONTRAST  TECHNIQUE: Multidetector CT imaging of the abdomen and pelvis was performed using the standard protocol following bolus administration of intravenous contrast.  CONTRAST:  80mL OMNIPAQUE IOHEXOL 300 MG/ML  SOLN  COMPARISON:  08/14/2011  FINDINGS: Lower chest: There is a small left pleural effusion. There is consolidation and volume loss in the left lower lobe. There is a large hiatal hernia.  Hepatobiliary: There are a few small cysts in the liver, the largest measuring 5 mm. These are unchanged from 08/14/2011. No significant liver lesion. Gallbladder and bile ducts appear unremarkable.  Pancreas: Normal  Spleen: Normal  Adrenals/Urinary Tract: The adrenals and kidneys are normal in appearance. There is no urinary calculus evident. There is no hydronephrosis or ureteral dilatation. Collecting systems and ureters appear unremarkable. Urinary bladder is unremarkable.  Stomach/Bowel: Most of the stomach is above the diaphragm. Small bowel is unremarkable. There is extensive colonic diverticulosis without evidence of diverticulitis.  Vascular/Lymphatic: The abdominal aorta is normal in caliber. There is mild atherosclerotic calcification. There is no adenopathy in the abdomen or pelvis.  Reproductive: There is hysterectomy.  No adnexal abnormalities.  Other: No acute inflammatory changes are evident in the abdomen or pelvis. There is no ascites.  Musculoskeletal: There is no significant musculoskeletal lesion. There is moderately severe degenerative disc change at L5-S1  IMPRESSION: 1. Left lower lobe consolidation and volume loss. Small left pleural effusion. Given the clinical  history, this could represent pneumonia with a parapneumonic effusion. 2. Sub cm benign hepatic cysts. 3. Large hiatal hernia. 4. Diverticulosis.   Electronically Signed   By: Ellery Plunk M.D.   On: 02/04/2015 03:22   Ct Maxillofacial Wo Cm  02/03/2015   CLINICAL DATA:  81 year old female with history of trauma from a fall at home 1 week ago with injury to the face. Visible hematoma over the left cheek bone. Weakness and lethargy. Emesis for 1 week.  EXAM: CT HEAD WITHOUT CONTRAST  CT MAXILLOFACIAL WITHOUT CONTRAST  CT CERVICAL SPINE WITHOUT CONTRAST  TECHNIQUE: Multidetector CT imaging of the head, cervical spine, and maxillofacial structures were performed using the standard protocol without intravenous contrast. Multiplanar CT image reconstructions of the cervical spine and maxillofacial structures were also generated.  COMPARISON:  PET-CT 12/17/2013.  Cervical spine CT 12/17/2013.  FINDINGS: CT HEAD FINDINGS  Patchy and confluent areas of decreased attenuation are noted throughout the deep and periventricular white matter of the cerebral hemispheres bilaterally, compatible with chronic microvascular ischemic disease. No acute displaced skull fractures are identified. No acute intracranial abnormality. Specifically, no evidence of acute post-traumatic intracranial hemorrhage, no definite regions of acute/subacute cerebral ischemia, no focal mass,  mass effect, hydrocephalus or abnormal intra or extra-axial fluid collections. The visualized paranasal sinuses and mastoids are well pneumatized.  CT MAXILLOFACIAL FINDINGS  Extensive high attenuation soft tissue swelling overlying the left maxilla, compatible with the reported hematoma. No underlying displaced facial bone fractures. Pterygoid plates are intact. Mandibular condyles are located bilaterally. Bilateral globes and retro-orbital soft tissues are grossly normal in appearance. The patient is edentulous.  CT CERVICAL SPINE FINDINGS  No acute displaced  cervical spine fractures. Exaggeration of normal cervical lordosis, likely chronic. Alignment is otherwise anatomic. Mild multilevel degenerative disc disease and facet arthropathy. Visualized portions of the upper thorax are remarkable for scattered areas of mild septal thickening and some bilateral apical pleuroparenchymal thickening, presumably areas of post infectious or inflammatory scarring.  IMPRESSION: 1. Large hematoma overlying the left maxilla. No underlying displaced facial bone fractures. 2. No acute intracranial abnormalities. 3. No evidence of significant acute traumatic injury to the cervical spine. 4. Chronic microvascular ischemic changes in the cerebral white matter. 5. Mild multilevel degenerative disc disease and cervical spondylosis, as above. 6. Additional incidental findings, as above.   Electronically Signed   By: Trudie Reed M.D.   On: 02/03/2015 21:28    Scheduled Meds: . amLODipine  5 mg Oral Daily  . azithromycin  500 mg Intravenous Q24H  . cefTRIAXone (ROCEPHIN)  IV  1 g Intravenous Q24H  . feeding supplement (ENSURE ENLIVE)  237 mL Oral TID BM  . pantoprazole  40 mg Oral Daily   Continuous Infusions: . sodium chloride 100 mL/hr at 02/04/15 7680    Principal Problem:   CAP (community acquired pneumonia) Active Problems:   Meniere's disease   Unstable gait   Depression   Sepsis   Anxiety   Essential hypertension   Fall   Generalized weakness   GERD (gastroesophageal reflux disease)   Protein-calorie malnutrition, severe   CHIU, STEPHEN K  [facility] Pager [phone]. If 7PM-7AM, please contact night-coverage at [email or web address], password [facility] 02/04/2015, 6:09 PM  LOS: 0 days

## 2015-02-04 NOTE — Progress Notes (Signed)
Utilization Review completed.  Milan Perkins RN CM  

## 2015-02-04 NOTE — ED Notes (Signed)
Patient transported to CT 

## 2015-02-04 NOTE — Progress Notes (Signed)
Just making you aware pt had Novosel burst of SVT (HR in 140's-150's).  Strip saved, pt asymptomatic.  VSS.  MD made aware, will continue to monitor closely.

## 2015-02-04 NOTE — ED Provider Notes (Signed)
Midnight - care from Dr. Patria Mane. 81 year old female here with weakness, recurring falls recently. Labs unremarkable except for white count of 38,000. Awaiting CT of her abdomen. CT shows left-sided pneumonia with parapneumonic effusion and consolidation. Blood cultures obtained, CAP coverage provided for antibiosis. When I explained this to the family they stated she's had some cough for a long time that is productive. Plan on admission.  1. Community acquired pneumonia    I have reviewed all labs and imaging and considered them in my medical decision making.   CT Abdomen Pelvis W Contrast (Final result) Result time: 02/04/15 03:22:26   Final result by Rad Results In Interface (02/04/15 03:22:26)   Narrative:   CLINICAL DATA: Weakness, nausea and vomiting. Leukocytosis.  EXAM: CT ABDOMEN AND PELVIS WITH CONTRAST  TECHNIQUE: Multidetector CT imaging of the abdomen and pelvis was performed using the standard protocol following bolus administration of intravenous contrast.  CONTRAST: 27mL OMNIPAQUE IOHEXOL 300 MG/ML SOLN  COMPARISON: 08/14/2011  FINDINGS: Lower chest: There is a small left pleural effusion. There is consolidation and volume loss in the left lower lobe. There is a large hiatal hernia.  Hepatobiliary: There are a few small cysts in the liver, the largest measuring 5 mm. These are unchanged from 08/14/2011. No significant liver lesion. Gallbladder and bile ducts appear unremarkable.  Pancreas: Normal  Spleen: Normal  Adrenals/Urinary Tract: The adrenals and kidneys are normal in appearance. There is no urinary calculus evident. There is no hydronephrosis or ureteral dilatation. Collecting systems and ureters appear unremarkable. Urinary bladder is unremarkable.  Stomach/Bowel: Most of the stomach is above the diaphragm. Small bowel is unremarkable. There is extensive colonic diverticulosis without evidence of diverticulitis.  Vascular/Lymphatic: The  abdominal aorta is normal in caliber. There is mild atherosclerotic calcification. There is no adenopathy in the abdomen or pelvis.  Reproductive: There is hysterectomy. No adnexal abnormalities.  Other: No acute inflammatory changes are evident in the abdomen or pelvis. There is no ascites.  Musculoskeletal: There is no significant musculoskeletal lesion. There is moderately severe degenerative disc change at L5-S1  IMPRESSION: 1. Left lower lobe consolidation and volume loss. Small left pleural effusion. Given the clinical history, this could represent pneumonia with a parapneumonic effusion. 2. Sub cm benign hepatic cysts. 3. Large hiatal hernia. 4. Diverticulosis.   Electronically Signed By: Ellery Plunk M.D. On: 02/04/2015 03:22          CT Head Wo Contrast (Final result) Result time: 02/03/15 21:28:06   Final result by Rad Results In Interface (02/03/15 21:28:06)   Narrative:   CLINICAL DATA: 81 year old female with history of trauma from a fall at home 1 week ago with injury to the face. Visible hematoma over the left cheek bone. Weakness and lethargy. Emesis for 1 week.  EXAM: CT HEAD WITHOUT CONTRAST  CT MAXILLOFACIAL WITHOUT CONTRAST  CT CERVICAL SPINE WITHOUT CONTRAST  TECHNIQUE: Multidetector CT imaging of the head, cervical spine, and maxillofacial structures were performed using the standard protocol without intravenous contrast. Multiplanar CT image reconstructions of the cervical spine and maxillofacial structures were also generated.  COMPARISON: PET-CT 12/17/2013. Cervical spine CT 12/17/2013.  FINDINGS: CT HEAD FINDINGS  Patchy and confluent areas of decreased attenuation are noted throughout the deep and periventricular white matter of the cerebral hemispheres bilaterally, compatible with chronic microvascular ischemic disease. No acute displaced skull fractures are identified. No acute intracranial abnormality.  Specifically, no evidence of acute post-traumatic intracranial hemorrhage, no definite regions of acute/subacute cerebral ischemia, no focal mass, mass  effect, hydrocephalus or abnormal intra or extra-axial fluid collections. The visualized paranasal sinuses and mastoids are well pneumatized.  CT MAXILLOFACIAL FINDINGS  Extensive high attenuation soft tissue swelling overlying the left maxilla, compatible with the reported hematoma. No underlying displaced facial bone fractures. Pterygoid plates are intact. Mandibular condyles are located bilaterally. Bilateral globes and retro-orbital soft tissues are grossly normal in appearance. The patient is edentulous.  CT CERVICAL SPINE FINDINGS  No acute displaced cervical spine fractures. Exaggeration of normal cervical lordosis, likely chronic. Alignment is otherwise anatomic. Mild multilevel degenerative disc disease and facet arthropathy. Visualized portions of the upper thorax are remarkable for scattered areas of mild septal thickening and some bilateral apical pleuroparenchymal thickening, presumably areas of post infectious or inflammatory scarring.  IMPRESSION: 1. Large hematoma overlying the left maxilla. No underlying displaced facial bone fractures. 2. No acute intracranial abnormalities. 3. No evidence of significant acute traumatic injury to the cervical spine. 4. Chronic microvascular ischemic changes in the cerebral white matter. 5. Mild multilevel degenerative disc disease and cervical spondylosis, as above. 6. Additional incidental findings, as above.   Electronically Signed By: Trudie Reed M.D. On: 02/03/2015 21:28     Elwin Mocha, MD 02/04/15 [phone]

## 2015-02-05 ENCOUNTER — Inpatient Hospital Stay (HOSPITAL_COMMUNITY): Payer: Medicare Other

## 2015-02-05 DIAGNOSIS — F329 Major depressive disorder, single episode, unspecified: Secondary | ICD-10-CM

## 2015-02-05 DIAGNOSIS — R2681 Unsteadiness on feet: Secondary | ICD-10-CM

## 2015-02-05 DIAGNOSIS — J690 Pneumonitis due to inhalation of food and vomit: Secondary | ICD-10-CM

## 2015-02-05 DIAGNOSIS — I10 Essential (primary) hypertension: Secondary | ICD-10-CM

## 2015-02-05 DIAGNOSIS — F419 Anxiety disorder, unspecified: Secondary | ICD-10-CM

## 2015-02-05 DIAGNOSIS — J189 Pneumonia, unspecified organism: Secondary | ICD-10-CM

## 2015-02-05 DIAGNOSIS — W19XXXA Unspecified fall, initial encounter: Secondary | ICD-10-CM

## 2015-02-05 LAB — LEGIONELLA ANTIGEN, URINE

## 2015-02-05 LAB — CBC
HCT: 34.3 % — ABNORMAL LOW (ref 36.0–46.0)
Hemoglobin: 11.2 g/dL — ABNORMAL LOW (ref 12.0–15.0)
MCH: 31.4 pg (ref 26.0–34.0)
MCHC: 32.7 g/dL (ref 30.0–36.0)
MCV: 96.1 fL (ref 78.0–100.0)
Platelets: 377 10*3/uL (ref 150–400)
RBC: 3.57 MIL/uL — ABNORMAL LOW (ref 3.87–5.11)
RDW: 15 % (ref 11.5–15.5)
WBC: 14.3 10*3/uL — ABNORMAL HIGH (ref 4.0–10.5)

## 2015-02-05 MED ORDER — ONDANSETRON HCL 4 MG/2ML IJ SOLN: 4.0000 mg | Freq: Four times a day (QID) | INTRAMUSCULAR | Status: DC | PRN

## 2015-02-05 MED ORDER — GI COCKTAIL ~~LOC~~
30.0000 mL | Freq: Once | ORAL | Status: AC
  Administered 2015-02-05: 30 mL via ORAL
  Filled 2015-02-05: qty 30

## 2015-02-05 MED ORDER — GUAIFENESIN ER 600 MG PO TB12
600.0000 mg | ORAL_TABLET | Freq: Two times a day (BID) | ORAL | Status: DC
  Administered 2015-02-05 – 2015-02-07 (×6): 600 mg via ORAL
  Filled 2015-02-05 (×7): qty 1

## 2015-02-05 MED ORDER — KETOROLAC TROMETHAMINE 15 MG/ML IJ SOLN
15.0000 mg | Freq: Four times a day (QID) | INTRAMUSCULAR | Status: AC
  Administered 2015-02-05 – 2015-02-06 (×4): 15 mg via INTRAVENOUS
  Filled 2015-02-05 (×4): qty 1

## 2015-02-05 MED ORDER — ALBUTEROL SULFATE (2.5 MG/3ML) 0.083% IN NEBU
2.5000 mg | INHALATION_SOLUTION | Freq: Four times a day (QID) | RESPIRATORY_TRACT | Status: DC
  Administered 2015-02-05 – 2015-02-06 (×4): 2.5 mg via RESPIRATORY_TRACT
  Filled 2015-02-05 (×4): qty 3

## 2015-02-05 NOTE — Progress Notes (Signed)
[facility] PROGRESS NOTE  Nicole Key:[id number] DOB: Dec 01, 1927 DOA: 02/03/2015 PCP: [name], MD  Assessment/Plan: CAP vs Aspiration PNA and sepsis;  - Remains on IV Rocephin and azithromycin - Mucinex for cough  - Xopenex Neb prn for SOB - Urine legionella pending - Urine S. pneumococcal antigen NEG - Blood culture x2 neg thus far - Sputum culture and respiratory virus panel pending - CT chest with contrast with B pleural effusions and atelectasis vs airspace disease - Obstruction noted in the small airways mainly in the LLL which may be due to mucus plugging. - Consulted Pulmonary who agrees with mucus plugging on CT. Possible aspiration PNA - Pending SLP eval  Fall:  - pt/ot consulted - Cont abx for CAP vs asp PNA and sepsis above. - Stable  Anxiety:  - Remains stable, no suicidal or homicidal ideations. -Continue home medications: Xanax   GERD: -Protonix  HTN:  -Amlodipine -Stable  DVT ppx: SCD  Code Status: DNR Family Communication: Pt in room, discussed  Disposition Plan: Pending   Consultants:    Procedures:    Antibiotics:  Rocephin 6/8>>>  Azithromycin 6/8>>>  HPI/Subjective: Patient reports pain while swallowing  Objective: Filed Vitals:   02/04/15 1840 02/04/15 2059 02/05/15 0457 02/05/15 1207  BP: 133/57 138/60 150/64   Pulse: 85 91 92   Temp: 98.7 F (37.1 C) 99.4 F (37.4 C) 98.2 F (36.8 C)   TempSrc: Oral Oral Oral   Resp: 20 20 18    Height:      Weight:      SpO2: 93% 92% 92% 93%    Intake/Output Summary (Last 24 hours) at 02/05/15 1406 Last data filed at 02/05/15 1352  Gross per 24 hour  Intake   2600 ml  Output   2950 ml  Net   -350 ml   Filed Weights   02/03/15 1828 02/04/15 0648  Weight: 45.36 kg (100 lb) 55 kg (121 lb 4.1 oz)    Exam:   General:  Awake, laying in bed, in nad  Cardiovascular: regular, s1, s2  Respiratory: normal resp effort, no wheezing  Abdomen: soft,  nondistended, pos BS  Musculoskeletal: perfused, no clubbing   Data Reviewed: Basic Metabolic Panel:  Recent Labs Lab 02/03/15 1859  NA 140  K 3.6  CL 98*  CO2 29  GLUCOSE 144*  BUN 15  CREATININE 0.64  CALCIUM 9.0   Liver Function Tests:  Recent Labs Lab 02/03/15 1859  AST 25  ALT 11*  ALKPHOS 100  BILITOT 0.8  PROT 7.2  ALBUMIN 3.4*    Recent Labs Lab 02/04/15 0036  LIPASE 10*   No results for input(s): AMMONIA in the last 168 hours. CBC:  Recent Labs Lab 02/03/15 1859 02/05/15 0755  WBC 38.0* 14.3*  NEUTROABS 33.1*  --   HGB 14.2 11.2*  HCT 43.3 34.3*  MCV 94.1 96.1  PLT 476* 377   Cardiac Enzymes:  Recent Labs Lab 02/03/15 1916  TROPONINI <0.03   BNP (last 3 results) No results for input(s): BNP in the last 8760 hours.  ProBNP (last 3 results) No results for input(s): PROBNP in the last 8760 hours.  CBG: No results for input(s): GLUCAP in the last 168 hours.  Recent Results (from the past 240 hour(s))  Urine culture     Status: None   Collection Time: 02/03/15  7:28 PM  Result Value Ref Range Status   Specimen Description URINE, RANDOM  Final   Special Requests NONE  Final  Colony Count NO GROWTH Performed at [facility]   Final   Culture NO GROWTH Performed at [facility]   Final   Report Status 02/04/2015 FINAL  Final  Culture, blood (routine x 2)     Status: None (Preliminary result)   Collection Time: 02/04/15  4:04 AM  Result Value Ref Range Status   Specimen Description RIGHT ANTECUBITAL  Final   Special Requests BOTTLES DRAWN AEROBIC AND ANAEROBIC  Final   Culture   Final           BLOOD CULTURE RECEIVED NO GROWTH TO DATE CULTURE WILL BE HELD FOR 5 DAYS BEFORE ISSUING A FINAL NEGATIVE REPORT Performed at [facility]    Report Status PENDING  Incomplete  Culture, blood (routine x 2)     Status: None (Preliminary result)   Collection Time: 02/04/15  4:04 AM  Result Value Ref Range Status    Specimen Description BLOOD RIGHT HAND  Final   Special Requests BOTTLES DRAWN AEROBIC AND ANAEROBIC  Final   Culture   Final           BLOOD CULTURE RECEIVED NO GROWTH TO DATE CULTURE WILL BE HELD FOR 5 DAYS BEFORE ISSUING A FINAL NEGATIVE REPORT Performed at [facility]    Report Status PENDING  Incomplete  Culture, respiratory (NON-Expectorated)     Status: None (Preliminary result)   Collection Time: 02/04/15  8:20 AM  Result Value Ref Range Status   Specimen Description SPUTUM  Final   Special Requests NONE  Final   Gram Stain   Final    RARE WBC PRESENT,BOTH PMN AND MONONUCLEAR RARE SQUAMOUS EPITHELIAL CELLS PRESENT MODERATE GRAM NEGATIVE RODS FEW GRAM POSITIVE COCCI IN PAIRS IN CLUSTERS RARE YEAST    Culture   Final    Culture reincubated for better growth Performed at [facility]    Report Status PENDING  Incomplete  Culture, sputum-assessment     Status: None   Collection Time: 02/04/15  8:30 AM  Result Value Ref Range Status   Specimen Description SPUTUM  Final   Special Requests NONE  Final   Sputum evaluation   Final    THIS SPECIMEN IS ACCEPTABLE. RESPIRATORY CULTURE REPORT TO FOLLOW.   Report Status 02/04/2015 FINAL  Final     Studies: Dg Chest 2 View  02/03/2015   CLINICAL DATA:  81 year old female with history of trauma from a fall 1 week ago. Poor appetite, weakness and lethargy since that time. Emesis.  EXAM: CHEST  2 VIEW  COMPARISON:  Chest x-ray 12/17/2013.  FINDINGS: Lung volumes are low. Opacity at the left lung base may reflect atelectasis and/or consolidation. Small left pleural effusion. Right lung is clear. No evidence of pulmonary edema. Mild diffuse interstitial prominence, similar to numerous prior examinations. Large hiatal hernia. Heart size is normal. Upper mediastinal contours are within normal limits. Atherosclerosis in the thoracic aorta.  IMPRESSION: 1. Atelectasis and/or consolidation in the left lower lobe. Small left-sided  pleural effusion. Followup PA and lateral chest X-ray is recommended in 3-4 weeks following trial of antibiotic therapy to ensure resolution and exclude underlying malignancy. 2. Moderate sized hiatal hernia. 3. Atherosclerosis.   Electronically Signed   By: Nicole Key M.D.   On: 02/03/2015 19:26   Ct Head Wo Contrast  02/03/2015   CLINICAL DATA:  81 year old female with history of trauma from a fall at home 1 week ago with injury to the face. Visible hematoma over the left  cheek bone. Weakness and lethargy. Emesis for 1 week.  EXAM: CT HEAD WITHOUT CONTRAST  CT MAXILLOFACIAL WITHOUT CONTRAST  CT CERVICAL SPINE WITHOUT CONTRAST  TECHNIQUE: Multidetector CT imaging of the head, cervical spine, and maxillofacial structures were performed using the standard protocol without intravenous contrast. Multiplanar CT image reconstructions of the cervical spine and maxillofacial structures were also generated.  COMPARISON:  PET-CT 12/17/2013.  Cervical spine CT 12/17/2013.  FINDINGS: CT HEAD FINDINGS  Patchy and confluent areas of decreased attenuation are noted throughout the deep and periventricular white matter of the cerebral hemispheres bilaterally, compatible with chronic microvascular ischemic disease. No acute displaced skull fractures are identified. No acute intracranial abnormality. Specifically, no evidence of acute post-traumatic intracranial hemorrhage, no definite regions of acute/subacute cerebral ischemia, no focal mass, mass effect, hydrocephalus or abnormal intra or extra-axial fluid collections. The visualized paranasal sinuses and mastoids are well pneumatized.  CT MAXILLOFACIAL FINDINGS  Extensive high attenuation soft tissue swelling overlying the left maxilla, compatible with the reported hematoma. No underlying displaced facial bone fractures. Pterygoid plates are intact. Mandibular condyles are located bilaterally. Bilateral globes and retro-orbital soft tissues are grossly normal in  appearance. The patient is edentulous.  CT CERVICAL SPINE FINDINGS  No acute displaced cervical spine fractures. Exaggeration of normal cervical lordosis, likely chronic. Alignment is otherwise anatomic. Mild multilevel degenerative disc disease and facet arthropathy. Visualized portions of the upper thorax are remarkable for scattered areas of mild septal thickening and some bilateral apical pleuroparenchymal thickening, presumably areas of post infectious or inflammatory scarring.  IMPRESSION: 1. Large hematoma overlying the left maxilla. No underlying displaced facial bone fractures. 2. No acute intracranial abnormalities. 3. No evidence of significant acute traumatic injury to the cervical spine. 4. Chronic microvascular ischemic changes in the cerebral white matter. 5. Mild multilevel degenerative disc disease and cervical spondylosis, as above. 6. Additional incidental findings, as above.   Electronically Signed   By: Nicole Key M.D.   On: 02/03/2015 21:28   Ct Chest W Contrast  02/04/2015   CLINICAL DATA:  Hemoptysis  EXAM: CT CHEST WITH CONTRAST  TECHNIQUE: Multidetector CT imaging of the chest was performed during intravenous contrast administration.  CONTRAST:  50mL OMNIPAQUE IOHEXOL 300 MG/ML  SOLN  COMPARISON:  None.  FINDINGS: Tiny hypodensity in the right lobe of the thyroid gland and image 8.  10 mm Pilgrim axis diameter precarinal node on image 22. There are enhancing soft tissue densities below the carina on image 26 which may represent a conglomeration of small lymph nodes. Piech axis diameter is 9 mm.  There is mild wall thickening of the entire length of the esophagus. No mediastinal emphysema. There is fluid density within the posterior and lower mediastinum adjacent to the hiatal hernia.  Large hiatal hernia containing much of the stomach is present.  Bilateral small pleural effusions.  No pneumothorax.  Left lower lobe consolidation versus collapse is present. This is associated with  intermittent obstruction of the central left lower lobe airways. It is difficult to determine if this is due to mucous plugging or an endobronchial lesion. There is some volume loss at the right lung base. There may be some material within the right lower lobe airways. Ground-glass opacities in the right upper lobe are noted. The largest is 2.7 x 2.0 cm.  Stable mild T10 compression compare with prior CT abdominal imaging. Healed or healing left posterior rib fractures are present.  Stable appearance of the abdomen compared yesterday.  IMPRESSION: Bilateral pleural effusions and  bibasilar atelectasis versus airspace disease as described. There is obstruction of the small airways particularly in the left lower lobe. This may be due to mucous plugging, however endobronchial lesion is not excluded. Bronchoscopy may be helpful.  Ground-glass opacities in the right upper lobe as described. This is a nonspecific finding. Followup in 3 months is recommended by CT to ensure resolution.  Large hiatal hernia  There is wall thickening of the esophagus. There is also fluid density within the mediastinum adjacent to the esophagus. There is no emphysema. Inflammatory process is not excluded.  Mild mediastinal adenopathy. Inflammatory and neoplastic etiology are considerations. Follow-up CT in 3 months is recommended to ensure resolution. Alternatively, PET-CT can be performed.   Electronically Signed   By: Jolaine Click M.D.   On: 02/04/2015 11:24   Ct Cervical Spine Wo Contrast  02/03/2015   CLINICAL DATA:  81 year old female with history of trauma from a fall at home 1 week ago with injury to the face. Visible hematoma over the left cheek bone. Weakness and lethargy. Emesis for 1 week.  EXAM: CT HEAD WITHOUT CONTRAST  CT MAXILLOFACIAL WITHOUT CONTRAST  CT CERVICAL SPINE WITHOUT CONTRAST  TECHNIQUE: Multidetector CT imaging of the head, cervical spine, and maxillofacial structures were performed using the standard protocol  without intravenous contrast. Multiplanar CT image reconstructions of the cervical spine and maxillofacial structures were also generated.  COMPARISON:  PET-CT 12/17/2013.  Cervical spine CT 12/17/2013.  FINDINGS: CT HEAD FINDINGS  Patchy and confluent areas of decreased attenuation are noted throughout the deep and periventricular white matter of the cerebral hemispheres bilaterally, compatible with chronic microvascular ischemic disease. No acute displaced skull fractures are identified. No acute intracranial abnormality. Specifically, no evidence of acute post-traumatic intracranial hemorrhage, no definite regions of acute/subacute cerebral ischemia, no focal mass, mass effect, hydrocephalus or abnormal intra or extra-axial fluid collections. The visualized paranasal sinuses and mastoids are well pneumatized.  CT MAXILLOFACIAL FINDINGS  Extensive high attenuation soft tissue swelling overlying the left maxilla, compatible with the reported hematoma. No underlying displaced facial bone fractures. Pterygoid plates are intact. Mandibular condyles are located bilaterally. Bilateral globes and retro-orbital soft tissues are grossly normal in appearance. The patient is edentulous.  CT CERVICAL SPINE FINDINGS  No acute displaced cervical spine fractures. Exaggeration of normal cervical lordosis, likely chronic. Alignment is otherwise anatomic. Mild multilevel degenerative disc disease and facet arthropathy. Visualized portions of the upper thorax are remarkable for scattered areas of mild septal thickening and some bilateral apical pleuroparenchymal thickening, presumably areas of post infectious or inflammatory scarring.  IMPRESSION: 1. Large hematoma overlying the left maxilla. No underlying displaced facial bone fractures. 2. No acute intracranial abnormalities. 3. No evidence of significant acute traumatic injury to the cervical spine. 4. Chronic microvascular ischemic changes in the cerebral white matter. 5. Mild  multilevel degenerative disc disease and cervical spondylosis, as above. 6. Additional incidental findings, as above.   Electronically Signed   By: Nicole Key M.D.   On: 02/03/2015 21:28   Ct Abdomen Pelvis W Contrast  02/04/2015   CLINICAL DATA:  Weakness, nausea and vomiting.  Leukocytosis.  EXAM: CT ABDOMEN AND PELVIS WITH CONTRAST  TECHNIQUE: Multidetector CT imaging of the abdomen and pelvis was performed using the standard protocol following bolus administration of intravenous contrast.  CONTRAST:  80mL OMNIPAQUE IOHEXOL 300 MG/ML  SOLN  COMPARISON:  08/14/2011  FINDINGS: Lower chest: There is a small left pleural effusion. There is consolidation and volume loss in the left lower  lobe. There is a large hiatal hernia.  Hepatobiliary: There are a few small cysts in the liver, the largest measuring 5 mm. These are unchanged from 08/14/2011. No significant liver lesion. Gallbladder and bile ducts appear unremarkable.  Pancreas: Normal  Spleen: Normal  Adrenals/Urinary Tract: The adrenals and kidneys are normal in appearance. There is no urinary calculus evident. There is no hydronephrosis or ureteral dilatation. Collecting systems and ureters appear unremarkable. Urinary bladder is unremarkable.  Stomach/Bowel: Most of the stomach is above the diaphragm. Small bowel is unremarkable. There is extensive colonic diverticulosis without evidence of diverticulitis.  Vascular/Lymphatic: The abdominal aorta is normal in caliber. There is mild atherosclerotic calcification. There is no adenopathy in the abdomen or pelvis.  Reproductive: There is hysterectomy.  No adnexal abnormalities.  Other: No acute inflammatory changes are evident in the abdomen or pelvis. There is no ascites.  Musculoskeletal: There is no significant musculoskeletal lesion. There is moderately severe degenerative disc change at L5-S1  IMPRESSION: 1. Left lower lobe consolidation and volume loss. Small left pleural effusion. Given the clinical  history, this could represent pneumonia with a parapneumonic effusion. 2. Sub cm benign hepatic cysts. 3. Large hiatal hernia. 4. Diverticulosis.   Electronically Signed   By: Ellery Plunk M.D.   On: 02/04/2015 03:22   Ct Maxillofacial Wo Cm  02/03/2015   CLINICAL DATA:  81 year old female with history of trauma from a fall at home 1 week ago with injury to the face. Visible hematoma over the left cheek bone. Weakness and lethargy. Emesis for 1 week.  EXAM: CT HEAD WITHOUT CONTRAST  CT MAXILLOFACIAL WITHOUT CONTRAST  CT CERVICAL SPINE WITHOUT CONTRAST  TECHNIQUE: Multidetector CT imaging of the head, cervical spine, and maxillofacial structures were performed using the standard protocol without intravenous contrast. Multiplanar CT image reconstructions of the cervical spine and maxillofacial structures were also generated.  COMPARISON:  PET-CT 12/17/2013.  Cervical spine CT 12/17/2013.  FINDINGS: CT HEAD FINDINGS  Patchy and confluent areas of decreased attenuation are noted throughout the deep and periventricular white matter of the cerebral hemispheres bilaterally, compatible with chronic microvascular ischemic disease. No acute displaced skull fractures are identified. No acute intracranial abnormality. Specifically, no evidence of acute post-traumatic intracranial hemorrhage, no definite regions of acute/subacute cerebral ischemia, no focal mass, mass effect, hydrocephalus or abnormal intra or extra-axial fluid collections. The visualized paranasal sinuses and mastoids are well pneumatized.  CT MAXILLOFACIAL FINDINGS  Extensive high attenuation soft tissue swelling overlying the left maxilla, compatible with the reported hematoma. No underlying displaced facial bone fractures. Pterygoid plates are intact. Mandibular condyles are located bilaterally. Bilateral globes and retro-orbital soft tissues are grossly normal in appearance. The patient is edentulous.  CT CERVICAL SPINE FINDINGS  No acute displaced  cervical spine fractures. Exaggeration of normal cervical lordosis, likely chronic. Alignment is otherwise anatomic. Mild multilevel degenerative disc disease and facet arthropathy. Visualized portions of the upper thorax are remarkable for scattered areas of mild septal thickening and some bilateral apical pleuroparenchymal thickening, presumably areas of post infectious or inflammatory scarring.  IMPRESSION: 1. Large hematoma overlying the left maxilla. No underlying displaced facial bone fractures. 2. No acute intracranial abnormalities. 3. No evidence of significant acute traumatic injury to the cervical spine. 4. Chronic microvascular ischemic changes in the cerebral white matter. 5. Mild multilevel degenerative disc disease and cervical spondylosis, as above. 6. Additional incidental findings, as above.   Electronically Signed   By: Nicole Key M.D.   On: 02/03/2015 21:28  Scheduled Meds: . albuterol  2.5 mg Nebulization QID  . amLODipine  5 mg Oral Daily  . azithromycin  500 mg Intravenous Q24H  . cefTRIAXone (ROCEPHIN)  IV  1 g Intravenous Q24H  . feeding supplement (ENSURE ENLIVE)  237 mL Oral TID BM  . guaiFENesin  600 mg Oral BID  . ketorolac  15 mg Intravenous 4 times per day  . pantoprazole  40 mg Oral Daily   Continuous Infusions: . sodium chloride 100 mL/hr at 02/05/15 1022    Principal Problem:   CAP (community acquired pneumonia) Active Problems:   Meniere's disease   Unstable gait   Depression   Sepsis   Anxiety   Essential hypertension   Fall   Generalized weakness   GERD (gastroesophageal reflux disease)   Protein-calorie malnutrition, severe   Onofrio Klemp K  [facility] Pager [phone]. If 7PM-7AM, please contact night-coverage at [email or web address], password [facility] 02/05/2015, 2:06 PM  LOS: 1 day

## 2015-02-05 NOTE — Progress Notes (Signed)
PT received Flutter on 02/04/15 (demonstrated verbal and hands on understanding of device)- per Dr. Rhona Leavens.

## 2015-02-05 NOTE — Evaluation (Signed)
Clinical/Bedside Swallow Evaluation Patient Details  Name: Nicole Key MRN: [id number] Date of Birth: [birth year]  Today's Date: 02/05/2015 Time: SLP Start Time (ACUTE ONLY): 1418 SLP Stop Time (ACUTE ONLY): 1453 SLP Time Calculation (min) (ACUTE ONLY): 35 min  Past Medical History:  Past Medical History  Diagnosis Date  . Insomnia   . Fibromyalgia   . Arthritis   . Meniere's disease 1989  . Hypertension   . Anxiety   . Vertigo   . GERD (gastroesophageal reflux disease)    Past Surgical History:  Past Surgical History  Procedure Laterality Date  . Appendectomy    . Abdominal hysterectomy    . Bilateral total mastectomy with axillary lymph node dissection  1981   HPI:  81 yo female admitted to [facility] with hemoptysis diagnosed with CAP.  PMH + for recent fall, ? esophageal stricture - as she underwent dilatation in 2005.  Per CT chest, pt with large hiatal hernia, ? mucus plug in LLL, wall thickening of esophagus, fluid density in mediastinum.  Per son, pt with very poor intake in the last 2 weeks since fall with weight loss.  Pt reports significant pain with swallowing liquids and sensation of poor clearance.  Swallow evaluation ordered.    Assessment / Plan / Recommendation Clinical Impression  Pt presents with clinical indications concerning for primary esophageal dysphagia.  No focal CN deficits noted although pt is deconditioned.  Pt without indication of aspiration with minimal intake observed *room temperture water and apple sauce.   Swallow was timely with adequate laryngeal elevation observed at bedside.    Pt indicates pain with swallow characterized by wincing and reports pain pointing to proximal esophagus.  Boluses of applesauce tolerated without overt pain however pt reports sensation of lodging in throat.    Advised pt to consider dry or liquid swallows to faciliate clearance - but she states it is not effective.  Suspect pt with baseline dysphagia that she managed  until recent fall.  Of note, son also reports pt with recent  "infection" from dirty utencils per son.    Recommend consider dedicated esophageal work up.  SLP to follow up next week to assure pt without significant pharyngeal deficits and aid in care plan.  MD informed.      Aspiration Risk  Moderate    Diet Recommendation Age appropriate regular solids;Thin (as pt tolerates)   Medication Administration: Other (Comment) (as tolerated) Compensations: Slow rate;Small sips/bites    Other  Recommendations Oral Care Recommendations: Oral care BID   Follow Up Recommendations    TBD   Frequency and Duration min 1 x/week  1 week   Pertinent Vitals/Pain Afebrile, decreased      Swallow Study Prior Functional Status   see HHX    General Date of Onset: 02/05/15 Other Pertinent Information: 81 yo female admitted to [facility] with hemoptysis diagnosed with CAP.  PMH + for recent fall, ? esophageal stricture - as she underwent dilatation in 2005.  Per CT chest, pt with large hiatal hernia, ? mucus plug in LLL, wall thickening of esophagus, fluid density in mediastinum.  Per son, pt with very poor intake in the last 2 weeks since fall with weight loss.  Pt reports significant pain with swallowing liquids and sensation of poor clearance.  Swallow evaluation ordered.  Type of Study: Bedside swallow evaluation Diet Prior to this Study: Regular;Thin liquids Temperature Spikes Noted: Yes Respiratory Status: Room air History of Recent Intubation: No Behavior/Cognition: Alert;Cooperative;Pleasant mood Oral Cavity -  Dentition: Edentulous (dentures upper and lower, pt uses [place] on lowers) Self-Feeding Abilities: Able to feed self Baseline Vocal Quality: Other (comment) (weak) Volitional Cough: Weak Volitional Swallow: Able to elicit    Oral/Motor/Sensory Function Overall Oral Motor/Sensory Function: Appears within functional limits for tasks assessed (generalized weakness)   Ice Chips Ice chips: Not  tested   Thin Liquid Thin Liquid: Impaired Presentation: Cup;Self Fed Pharyngeal  Phase Impairments: Multiple swallows Other Comments: pt reports pain with swallowing - overt indication c/b wincing    Nectar Thick Nectar Thick Liquid: Not tested   Honey Thick Honey Thick Liquid: Not tested   Puree Puree: Impaired Presentation: Self Fed;Spoon Pharyngeal Phase Impairments: Multiple swallows;Other (comments) (sensation of residuals in pharynx and distal esophagus)   Solid   GO    Solid: Not tested       Donavan Burnet, MS [facility] SLP [phone]

## 2015-02-05 NOTE — Progress Notes (Signed)
Central telemetry called reporting that pt had Steinhardt burst of SVT.  Pt asymptomatic. Strip saved in chart.  MD made aware.  Will continue to monitor closely.

## 2015-02-05 NOTE — Consult Note (Signed)
Name: Nicole Key MRN: [id number] DOB: 1927/09/19    ADMISSION DATE:  02/03/2015 CONSULTATION DATE:  6/10  REFERRING MD :  CHIU  CHIEF COMPLAINT:  Abnormal CT chest   BRIEF PATIENT DESCRIPTION:  81 year old female w/ mult co-morbids  hypertension, GERD, anxiety, fibromyalgia, and chronic cough X 2 yrs. Admitted 6/8 w/ working dx of CAP, hemoptysis, and pleuritic type CP. She underwent CT imaging to r/o malignancy given the hemoptysis. CT imaging questions LLL airway obstruction/ narrowing which was difficult to determine mucous plugging vs endobronchial lesions. Because of this PCCM was consulted.   SIGNIFICANT EVENTS    STUDIES:  CT chest 6/9: Bilateral pleural effusions and bibasilar atelectasis versus airspace disease as described. There is obstruction of the small airways particularly in the left lower lobe. This may be due to mucous plugging, however endobronchial lesion is not excluded. Ground-glass opacities in the right upper lobe as described. Large hiatal hernia There is wall thickening of the esophagus. There is also fluid density within the mediastinum adjacent to the esophagus.  Mild mediastinal adenopathy.  resp culture 6/9: mod GNR, few GPC, rare yeast, rare WBC>>> BCX2 6/9>>> u strep 6/9: neg u legionella 6/9: >>>  HISTORY OF PRESENT ILLNESS:   81 y.o. female with PMH of hypertension, GERD, anxiety, fibromyalgia, vertigo, Mnire's disease, insomnia, who presented 6/8 with cough, chest pain and shortness of breath, fall.Patient reports that she has chronic cough in the past 2 years, which has been getting worse recently. She coughs up streaks amount of blood and brown colored sputum. She also had pleuritic chest pain and shortness of breath. She has fever and chills at home. And also reports chronic fall in the past 2 years, which has also been worsening recently. She denied running nose, ear pain, headaches, abdominal pain, diarrhea, constipation, dysuria, urgency,  frequency, hematuria, skin rashes or leg swelling. She had a generalized weakness, but no unilateral weakness, numbness or tingling sensations. No vision change or hearing loss.In ED, patient was found to have WBCs 38, heart rate 125, temperature 100.9. Chest x-ray showed possible left lower lobe pneumonia with small amount of pleural effusion. CT head was negative for acute intracranial abnormalities, but showed hematoma over left maxilla. CT abdomen/pelvis did not show acute intra-abdominal abnormalities, but showed left lower lobe pneumonia and possible para pneumonic effusion on the left side. Patient was admitted w/ working dx of PNA. A CT of chest was obtained on 6/9 to further evaluate hemoptysis w/ concern for possible PNA. This showed small bilateral pleural effusions, marked LLL airspace disease and airway narrowing of the LLL. (see above), we were consulted as it was unclear if the airway narrowing was due to mucous plugging or endobronchial lesion, because of this PCCM was consulted.   PAST MEDICAL HISTORY :   has a past medical history of Insomnia; Fibromyalgia; Arthritis; Meniere's disease (1989); Hypertension; Anxiety; Vertigo; and GERD (gastroesophageal reflux disease).  has past surgical history that includes Appendectomy; Abdominal hysterectomy; and Bilateral total mastectomy with axillary lymph node dissection (1981). Prior to Admission medications   Medication Sig Start Date End Date Taking? Authorizing Provider  ALPRAZolam (XANAX) 0.25 MG tablet Take 0.25 mg by mouth 2 (two) times daily as needed (anxiety).  08/09/13  Yes Historical Provider, MD  amLODipine (NORVASC) 5 MG tablet Take 1 tablet (5 mg total) by mouth daily. 08/18/13  Yes Geoffry Paradise, MD  HYDROcodone-acetaminophen (NORCO/VICODIN) 5-325 MG per tablet Take 1 tablet by mouth every 6 (six) hours as  needed for moderate pain.   Yes Historical Provider, MD  omeprazole (PRILOSEC) 20 MG capsule Take 20 mg by mouth daily.  07/11/13  Yes Historical Provider, MD  promethazine (PHENERGAN) 25 MG tablet Take 25 mg by mouth every 8 (eight) hours as needed for nausea or vomiting.   Yes Historical Provider, MD  traMADol (ULTRAM) 50 MG tablet Take 50 mg by mouth every 6 (six) hours as needed (pain). Maximum dose= 8 tablets per day as needed for pain   Yes Historical Provider, MD  Nutritional Supplements (ENSURE ACTIVE HIGH PROTEIN) LIQD Take 1 Can by mouth 3 (three) times daily. Patient not taking: Reported on 11/23/2014 12/17/13   Dahlia Client Muthersbaugh, PA-C   Allergies  Allergen Reactions  . Shellfish Allergy     unknown    FAMILY HISTORY:  family history includes Aortic aneurysm in her mother; Heart disease in her father; Lung cancer in her father. SOCIAL HISTORY:  reports that she has never smoked. She has never used smokeless tobacco. She reports that she does not drink alcohol or use illicit drugs.  REVIEW OF SYSTEMS:   Constitutional: Negative for fever, chills, weight loss, malaise/fatigue and diaphoresis.  HENT: Negative for hearing loss, ear pain, nosebleeds, congestion, sore throat, neck pain, tinnitus and ear discharge.   Eyes: Negative for blurred vision, double vision, photophobia, pain, discharge and redness.  Respiratory: +cough especially after eating and w/ eating, also at night when supine, hemoptysis, sputum production, shortness of breath, wheezing and stridor.   Cardiovascular: Negative for chest pain ++ pleuritic in nature, palpitations, orthopnea, claudication, leg swelling and PND.  Gastrointestinal: Negative for heartburn, nausea, vomiting, abdominal pain, diarrhea, constipation, blood in stool and melena. Difficulty swallowing  Genitourinary: Negative for dysuria, urgency, frequency, hematuria and flank pain.  Musculoskeletal: Negative for myalgias, back pain, joint pain and falls.  Skin: Negative for itching and rash.  Neurological: Negative for dizziness, tingling, tremors, sensory change,  speech change, focal weakness, seizures, loss of consciousness, weakness and headaches.  Endo/Heme/Allergies: Negative for environmental allergies and polydipsia. Does not bruise/bleed easily.  SUBJECTIVE: feeling a little better VITAL SIGNS: Temp:  [97.8 F (36.6 C)-99.4 F (37.4 C)] 98.2 F (36.8 C) (06/10 0457) Pulse Rate:  [83-98] 92 (06/10 0457) Resp:  [18-20] 18 (06/10 0457) BP: (110-156)/(53-64) 150/64 mmHg (06/10 0457) SpO2:  [92 %-95 %] 92 % (06/10 0457)  PHYSICAL EXAMINATION: General:  Frail 81 year old female, not in acute distress but very weak Neuro:  Awake, alert, no focal def  HEENT:  Periorbital ecchymosis no JVD Cardiovascular:  rrr Lungs:  Scattered rhonchi  Abdomen:  Soft, non-tender  Musculoskeletal:  intact Skin:  Mult areas of ecchymosis    Recent Labs Lab 02/03/15 1859  NA 140  K 3.6  CL 98*  CO2 29  BUN 15  CREATININE 0.64  GLUCOSE 144*    Recent Labs Lab 02/03/15 1859 02/05/15 0755  HGB 14.2 11.2*  HCT 43.3 34.3*  WBC 38.0* 14.3*  PLT 476* 377   Dg Chest 2 View  02/03/2015   CLINICAL DATA:  81 year old female with history of trauma from a fall 1 week ago. Poor appetite, weakness and lethargy since that time. Emesis.  EXAM: CHEST  2 VIEW  COMPARISON:  Chest x-ray 12/17/2013.  FINDINGS: Lung volumes are low. Opacity at the left lung base may reflect atelectasis and/or consolidation. Small left pleural effusion. Right lung is clear. No evidence of pulmonary edema. Mild diffuse interstitial prominence, similar to numerous prior examinations. Large hiatal  hernia. Heart size is normal. Upper mediastinal contours are within normal limits. Atherosclerosis in the thoracic aorta.  IMPRESSION: 1. Atelectasis and/or consolidation in the left lower lobe. Small left-sided pleural effusion. Followup PA and lateral chest X-ray is recommended in 3-4 weeks following trial of antibiotic therapy to ensure resolution and exclude underlying malignancy. 2. Moderate  sized hiatal hernia. 3. Atherosclerosis.   Electronically Signed   By: Trudie Reed M.D.   On: 02/03/2015 19:26   Ct Head Wo Contrast  02/03/2015   CLINICAL DATA:  81 year old female with history of trauma from a fall at home 1 week ago with injury to the face. Visible hematoma over the left cheek bone. Weakness and lethargy. Emesis for 1 week.  EXAM: CT HEAD WITHOUT CONTRAST  CT MAXILLOFACIAL WITHOUT CONTRAST  CT CERVICAL SPINE WITHOUT CONTRAST  TECHNIQUE: Multidetector CT imaging of the head, cervical spine, and maxillofacial structures were performed using the standard protocol without intravenous contrast. Multiplanar CT image reconstructions of the cervical spine and maxillofacial structures were also generated.  COMPARISON:  PET-CT 12/17/2013.  Cervical spine CT 12/17/2013.  FINDINGS: CT HEAD FINDINGS  Patchy and confluent areas of decreased attenuation are noted throughout the deep and periventricular white matter of the cerebral hemispheres bilaterally, compatible with chronic microvascular ischemic disease. No acute displaced skull fractures are identified. No acute intracranial abnormality. Specifically, no evidence of acute post-traumatic intracranial hemorrhage, no definite regions of acute/subacute cerebral ischemia, no focal mass, mass effect, hydrocephalus or abnormal intra or extra-axial fluid collections. The visualized paranasal sinuses and mastoids are well pneumatized.  CT MAXILLOFACIAL FINDINGS  Extensive high attenuation soft tissue swelling overlying the left maxilla, compatible with the reported hematoma. No underlying displaced facial bone fractures. Pterygoid plates are intact. Mandibular condyles are located bilaterally. Bilateral globes and retro-orbital soft tissues are grossly normal in appearance. The patient is edentulous.  CT CERVICAL SPINE FINDINGS  No acute displaced cervical spine fractures. Exaggeration of normal cervical lordosis, likely chronic. Alignment is otherwise  anatomic. Mild multilevel degenerative disc disease and facet arthropathy. Visualized portions of the upper thorax are remarkable for scattered areas of mild septal thickening and some bilateral apical pleuroparenchymal thickening, presumably areas of post infectious or inflammatory scarring.  IMPRESSION: 1. Large hematoma overlying the left maxilla. No underlying displaced facial bone fractures. 2. No acute intracranial abnormalities. 3. No evidence of significant acute traumatic injury to the cervical spine. 4. Chronic microvascular ischemic changes in the cerebral white matter. 5. Mild multilevel degenerative disc disease and cervical spondylosis, as above. 6. Additional incidental findings, as above.   Electronically Signed   By: Trudie Reed M.D.   On: 02/03/2015 21:28   Ct Chest W Contrast  02/04/2015   CLINICAL DATA:  Hemoptysis  EXAM: CT CHEST WITH CONTRAST  TECHNIQUE: Multidetector CT imaging of the chest was performed during intravenous contrast administration.  CONTRAST:  50mL OMNIPAQUE IOHEXOL 300 MG/ML  SOLN  COMPARISON:  None.  FINDINGS: Tiny hypodensity in the right lobe of the thyroid gland and image 8.  10 mm Abdelaziz axis diameter precarinal node on image 22. There are enhancing soft tissue densities below the carina on image 26 which may represent a conglomeration of small lymph nodes. Zirkelbach axis diameter is 9 mm.  There is mild wall thickening of the entire length of the esophagus. No mediastinal emphysema. There is fluid density within the posterior and lower mediastinum adjacent to the hiatal hernia.  Large hiatal hernia containing much of the stomach is present.  Bilateral  small pleural effusions.  No pneumothorax.  Left lower lobe consolidation versus collapse is present. This is associated with intermittent obstruction of the central left lower lobe airways. It is difficult to determine if this is due to mucous plugging or an endobronchial lesion. There is some volume loss at the right  lung base. There may be some material within the right lower lobe airways. Ground-glass opacities in the right upper lobe are noted. The largest is 2.7 x 2.0 cm.  Stable mild T10 compression compare with prior CT abdominal imaging. Healed or healing left posterior rib fractures are present.  Stable appearance of the abdomen compared yesterday.  IMPRESSION: Bilateral pleural effusions and bibasilar atelectasis versus airspace disease as described. There is obstruction of the small airways particularly in the left lower lobe. This may be due to mucous plugging, however endobronchial lesion is not excluded. Bronchoscopy may be helpful.  Ground-glass opacities in the right upper lobe as described. This is a nonspecific finding. Followup in 3 months is recommended by CT to ensure resolution.  Large hiatal hernia  There is wall thickening of the esophagus. There is also fluid density within the mediastinum adjacent to the esophagus. There is no emphysema. Inflammatory process is not excluded.  Mild mediastinal adenopathy. Inflammatory and neoplastic etiology are considerations. Follow-up CT in 3 months is recommended to ensure resolution. Alternatively, PET-CT can be performed.   Electronically Signed   By: Jolaine Click M.D.   On: 02/04/2015 11:24   Ct Cervical Spine Wo Contrast  02/03/2015   CLINICAL DATA:  81 year old female with history of trauma from a fall at home 1 week ago with injury to the face. Visible hematoma over the left cheek bone. Weakness and lethargy. Emesis for 1 week.  EXAM: CT HEAD WITHOUT CONTRAST  CT MAXILLOFACIAL WITHOUT CONTRAST  CT CERVICAL SPINE WITHOUT CONTRAST  TECHNIQUE: Multidetector CT imaging of the head, cervical spine, and maxillofacial structures were performed using the standard protocol without intravenous contrast. Multiplanar CT image reconstructions of the cervical spine and maxillofacial structures were also generated.  COMPARISON:  PET-CT 12/17/2013.  Cervical spine CT  12/17/2013.  FINDINGS: CT HEAD FINDINGS  Patchy and confluent areas of decreased attenuation are noted throughout the deep and periventricular white matter of the cerebral hemispheres bilaterally, compatible with chronic microvascular ischemic disease. No acute displaced skull fractures are identified. No acute intracranial abnormality. Specifically, no evidence of acute post-traumatic intracranial hemorrhage, no definite regions of acute/subacute cerebral ischemia, no focal mass, mass effect, hydrocephalus or abnormal intra or extra-axial fluid collections. The visualized paranasal sinuses and mastoids are well pneumatized.  CT MAXILLOFACIAL FINDINGS  Extensive high attenuation soft tissue swelling overlying the left maxilla, compatible with the reported hematoma. No underlying displaced facial bone fractures. Pterygoid plates are intact. Mandibular condyles are located bilaterally. Bilateral globes and retro-orbital soft tissues are grossly normal in appearance. The patient is edentulous.  CT CERVICAL SPINE FINDINGS  No acute displaced cervical spine fractures. Exaggeration of normal cervical lordosis, likely chronic. Alignment is otherwise anatomic. Mild multilevel degenerative disc disease and facet arthropathy. Visualized portions of the upper thorax are remarkable for scattered areas of mild septal thickening and some bilateral apical pleuroparenchymal thickening, presumably areas of post infectious or inflammatory scarring.  IMPRESSION: 1. Large hematoma overlying the left maxilla. No underlying displaced facial bone fractures. 2. No acute intracranial abnormalities. 3. No evidence of significant acute traumatic injury to the cervical spine. 4. Chronic microvascular ischemic changes in the cerebral white matter. 5. Mild multilevel  degenerative disc disease and cervical spondylosis, as above. 6. Additional incidental findings, as above.   Electronically Signed   By: Trudie Reed M.D.   On: 02/03/2015 21:28    Ct Abdomen Pelvis W Contrast  02/04/2015   CLINICAL DATA:  Weakness, nausea and vomiting.  Leukocytosis.  EXAM: CT ABDOMEN AND PELVIS WITH CONTRAST  TECHNIQUE: Multidetector CT imaging of the abdomen and pelvis was performed using the standard protocol following bolus administration of intravenous contrast.  CONTRAST:  58mL OMNIPAQUE IOHEXOL 300 MG/ML  SOLN  COMPARISON:  08/14/2011  FINDINGS: Lower chest: There is a small left pleural effusion. There is consolidation and volume loss in the left lower lobe. There is a large hiatal hernia.  Hepatobiliary: There are a few small cysts in the liver, the largest measuring 5 mm. These are unchanged from 08/14/2011. No significant liver lesion. Gallbladder and bile ducts appear unremarkable.  Pancreas: Normal  Spleen: Normal  Adrenals/Urinary Tract: The adrenals and kidneys are normal in appearance. There is no urinary calculus evident. There is no hydronephrosis or ureteral dilatation. Collecting systems and ureters appear unremarkable. Urinary bladder is unremarkable.  Stomach/Bowel: Most of the stomach is above the diaphragm. Small bowel is unremarkable. There is extensive colonic diverticulosis without evidence of diverticulitis.  Vascular/Lymphatic: The abdominal aorta is normal in caliber. There is mild atherosclerotic calcification. There is no adenopathy in the abdomen or pelvis.  Reproductive: There is hysterectomy.  No adnexal abnormalities.  Other: No acute inflammatory changes are evident in the abdomen or pelvis. There is no ascites.  Musculoskeletal: There is no significant musculoskeletal lesion. There is moderately severe degenerative disc change at L5-S1  IMPRESSION: 1. Left lower lobe consolidation and volume loss. Small left pleural effusion. Given the clinical history, this could represent pneumonia with a parapneumonic effusion. 2. Sub cm benign hepatic cysts. 3. Large hiatal hernia. 4. Diverticulosis.   Electronically Signed   By: Ellery Plunk  M.D.   On: 02/04/2015 03:22   Ct Maxillofacial Wo Cm  02/03/2015   CLINICAL DATA:  81 year old female with history of trauma from a fall at home 1 week ago with injury to the face. Visible hematoma over the left cheek bone. Weakness and lethargy. Emesis for 1 week.  EXAM: CT HEAD WITHOUT CONTRAST  CT MAXILLOFACIAL WITHOUT CONTRAST  CT CERVICAL SPINE WITHOUT CONTRAST  TECHNIQUE: Multidetector CT imaging of the head, cervical spine, and maxillofacial structures were performed using the standard protocol without intravenous contrast. Multiplanar CT image reconstructions of the cervical spine and maxillofacial structures were also generated.  COMPARISON:  PET-CT 12/17/2013.  Cervical spine CT 12/17/2013.  FINDINGS: CT HEAD FINDINGS  Patchy and confluent areas of decreased attenuation are noted throughout the deep and periventricular white matter of the cerebral hemispheres bilaterally, compatible with chronic microvascular ischemic disease. No acute displaced skull fractures are identified. No acute intracranial abnormality. Specifically, no evidence of acute post-traumatic intracranial hemorrhage, no definite regions of acute/subacute cerebral ischemia, no focal mass, mass effect, hydrocephalus or abnormal intra or extra-axial fluid collections. The visualized paranasal sinuses and mastoids are well pneumatized.  CT MAXILLOFACIAL FINDINGS  Extensive high attenuation soft tissue swelling overlying the left maxilla, compatible with the reported hematoma. No underlying displaced facial bone fractures. Pterygoid plates are intact. Mandibular condyles are located bilaterally. Bilateral globes and retro-orbital soft tissues are grossly normal in appearance. The patient is edentulous.  CT CERVICAL SPINE FINDINGS  No acute displaced cervical spine fractures. Exaggeration of normal cervical lordosis, likely chronic. Alignment is otherwise  anatomic. Mild multilevel degenerative disc disease and facet arthropathy. Visualized  portions of the upper thorax are remarkable for scattered areas of mild septal thickening and some bilateral apical pleuroparenchymal thickening, presumably areas of post infectious or inflammatory scarring.  IMPRESSION: 1. Large hematoma overlying the left maxilla. No underlying displaced facial bone fractures. 2. No acute intracranial abnormalities. 3. No evidence of significant acute traumatic injury to the cervical spine. 4. Chronic microvascular ischemic changes in the cerebral white matter. 5. Mild multilevel degenerative disc disease and cervical spondylosis, as above. 6. Additional incidental findings, as above.   Electronically Signed   By: Trudie Reed M.D.   On: 02/03/2015 21:28    ASSESSMENT / PLAN: LLL PNA-->presume that this is aspiration and mucous plugging  RUL ground glass pulm infiltrates Hemoptysis (resolved) Pleuritic chest pain Very Small bilateral L>R effusions LLL airway narrowing.  Esophageal stricture w/ h/o hiatal hernia Dysphagia   Would favor this being aspiration and mucous plugging over endobronchial lesion. Suspect that she has chronic aspiration in setting of esophageal stricture and refluxing. She describes years of dysphagia and difficulty swallowing as well as coughing w/ oral intake. I would favor these CT changes to reflect mucous plugging given her h/o dysphagia, weak cough, and deconditioning. I am not sure she is a good bronch candidate any way and furthermore even if she had a cancer doubt that she would be a candidate for any therapy.   Plan Cont current abx Add pulm hygiene measures (flutter and nebs) Add low dose toradol for low pleuritic type CP Would benefit from SLP consult May need esophogram  Consider re-CT chest 3 mo.. If functional status would support treatment   Simonne Martinet ACNP-BC [facility] [place] Pulmonary/Critical Care Pager # [phone] OR # [phone] if no answer   02/05/2015, 9:32 AM

## 2015-02-06 ENCOUNTER — Inpatient Hospital Stay (HOSPITAL_COMMUNITY): Payer: Medicare Other

## 2015-02-06 DIAGNOSIS — R531 Weakness: Secondary | ICD-10-CM

## 2015-02-06 DIAGNOSIS — J690 Pneumonitis due to inhalation of food and vomit: Secondary | ICD-10-CM | POA: Insufficient documentation

## 2015-02-06 DIAGNOSIS — J9601 Acute respiratory failure with hypoxia: Secondary | ICD-10-CM | POA: Insufficient documentation

## 2015-02-06 LAB — RESPIRATORY VIRUS PANEL
Adenovirus: NEGATIVE
INFLUENZA A: NEGATIVE
INFLUENZA B 1: NEGATIVE
Metapneumovirus: NEGATIVE
PARAINFLUENZA 3 A: NEGATIVE
Parainfluenza 1: NEGATIVE
Parainfluenza 2: NEGATIVE
Respiratory Syncytial Virus A: NEGATIVE
Respiratory Syncytial Virus B: NEGATIVE
Rhinovirus: NEGATIVE

## 2015-02-06 LAB — CBC
HCT: 33.5 % — ABNORMAL LOW (ref 36.0–46.0)
Hemoglobin: 10.7 g/dL — ABNORMAL LOW (ref 12.0–15.0)
MCH: 30.3 pg (ref 26.0–34.0)
MCHC: 31.9 g/dL (ref 30.0–36.0)
MCV: 94.9 fL (ref 78.0–100.0)
Platelets: 389 10*3/uL (ref 150–400)
RBC: 3.53 MIL/uL — ABNORMAL LOW (ref 3.87–5.11)
RDW: 15 % (ref 11.5–15.5)
WBC: 9 10*3/uL (ref 4.0–10.5)

## 2015-02-06 MED ORDER — CEFUROXIME AXETIL 250 MG PO TABS
250.0000 mg | ORAL_TABLET | Freq: Two times a day (BID) | ORAL | Status: DC
  Administered 2015-02-06 – 2015-02-07 (×2): 250 mg via ORAL
  Filled 2015-02-06 (×4): qty 1

## 2015-02-06 MED ORDER — ALBUTEROL SULFATE (2.5 MG/3ML) 0.083% IN NEBU
2.5000 mg | INHALATION_SOLUTION | Freq: Three times a day (TID) | RESPIRATORY_TRACT | Status: DC
  Administered 2015-02-06 – 2015-02-09 (×9): 2.5 mg via RESPIRATORY_TRACT
  Filled 2015-02-06 (×9): qty 3

## 2015-02-06 MED ORDER — TRAZODONE HCL 50 MG PO TABS
50.0000 mg | ORAL_TABLET | Freq: Every evening | ORAL | Status: DC | PRN
  Administered 2015-02-06 – 2015-02-08 (×3): 50 mg via ORAL
  Filled 2015-02-06 (×3): qty 1

## 2015-02-06 MED ORDER — KETOROLAC TROMETHAMINE 15 MG/ML IJ SOLN
15.0000 mg | Freq: Four times a day (QID) | INTRAMUSCULAR | Status: DC | PRN
  Administered 2015-02-07 – 2015-02-08 (×4): 15 mg via INTRAVENOUS
  Filled 2015-02-06 (×4): qty 1

## 2015-02-06 MED ORDER — METOPROLOL TARTRATE 25 MG PO TABS
12.5000 mg | ORAL_TABLET | Freq: Two times a day (BID) | ORAL | Status: DC
  Administered 2015-02-06 – 2015-02-09 (×7): 12.5 mg via ORAL
  Filled 2015-02-06 (×7): qty 1

## 2015-02-06 MED ORDER — AZITHROMYCIN 500 MG PO TABS
500.0000 mg | ORAL_TABLET | Freq: Every day | ORAL | Status: DC
  Administered 2015-02-07: 500 mg via ORAL
  Filled 2015-02-06: qty 1

## 2015-02-06 NOTE — Progress Notes (Signed)
Subjective: No respiratory distress.  Had esophagram that showed most of stomach in chest.  Objective: Vital signs in last 24 hours: Blood pressure 136/63, pulse 77, temperature 97.9 F (36.6 C), temperature source Oral, resp. rate 20, height  (1.727 m), weight 55 kg (121 lb 4.1 oz), SpO2 96 %.  Intake/Output from previous day: 06/10 0701 - 06/11 0700 In: 2700 [I.V.:2400; IV Piggyback:300] Out: 2725 [Urine:2725]   Physical Exam:   frail female in nad Nose without purulence or d/c noted. Neck without LN or TMG Chest with poor depth of inspiration, basilar crackles Cor with rrr abd soft with bs+.  NT/ND LE with mild edema Alert and oriented, moves all 4.    Lab Results:  Recent Labs  02/03/15 1859 02/05/15 0755 02/06/15 0507  WBC 38.0* 14.3* 9.0  HGB 14.2 11.2* 10.7*  HCT 43.3 34.3* 33.5*  PLT 476* 377 389   BMET  Recent Labs  02/03/15 1859  NA 140  K 3.6  CL 98*  CO2 29  GLUCOSE 144*  BUN 15  CREATININE 0.64  CALCIUM 9.0    Studies/Results: Dg Chest 2 View  02/06/2015   CLINICAL DATA:  Acute respiratory failure with hypoxia. Shortness of breath.  EXAM: CHEST  2 VIEW  COMPARISON:  Chest CT 02/04/2015.  Chest radiograph 02/03/2015.  FINDINGS: Cardiac silhouette is within normal limits for size. A moderately large hiatal hernia is again seen with a small amount of residual contrast projecting within. There are small bilateral pleural effusions, left larger than right and mildly increased in size compared to the prior radiographs. Left greater than right basilar airspace opacities have also increased from the prior radiographs although may not knee significantly changed from the more recent CT. Interstitial markings remain mildly prominent diffusely. No pneumothorax is identified. No acute osseous abnormality is seen.  IMPRESSION: Small bilateral pleural effusions and left greater than right basilar lung opacities, mildly increased from the prior radiographs and  may reflect a combination of atelectasis and left lower lobe pneumonia.   Electronically Signed   By: Sebastian Ache   On: 02/06/2015 08:52   Dg Esophagus  02/05/2015   CLINICAL DATA:  History of hiatal hernia.  EXAM: ESOPHOGRAM/BARIUM SWALLOW  TECHNIQUE: Single contrast examination was performed using thin and thick barium.  FLUOROSCOPY TIME:  Fluoroscopy Time:  59 seconds  Number of Acquired Images:  0  COMPARISON:  None.  FINDINGS: Extremely limited examination secondary to difficulty in patient positioning. The entire examination was performed in the supine semi upright position, through a straw.  There was normal pharyngeal anatomy. There are tertiary contractions of the esophagus. Contrast flowed freely through the esophagus without evidence of stricture or mass. There was normal esophageal mucosa without evidence of irregularity or ulceration. No evidence of reflux. There is a large hiatal hernia with the entirety of the stomach within the thorax with organoaxial malrotation without obstruction.  IMPRESSION: 1. Large hiatal hernia with the entirety of the stomach above the diaphragm. No gastric outlet obstruction.   Electronically Signed   By: Elige Ko   On: 02/05/2015 17:42    Assessment/Plan:  1) Probable aspiration PNA  The pt is having great difficulties with swallowing, and esophagram today with large hiatal hernia (most of stomach within the chest).  Would have general surgery take a look at her on Monday, to see if she is a candidate for repair.   -cover with abx for possible aspiration PNA.  ?zosyn -continue working on pulmonary toilet. -  surgical consult on Monday  Will check again on Monday     Nicole Key, M.D. 02/06/2015, 3:48 PM

## 2015-02-06 NOTE — Progress Notes (Signed)
[facility] PROGRESS NOTE  Nicole Key ZOX:[id number] DOB: 1927-09-27 DOA: 02/03/2015 PCP: [name], MD  Assessment/Plan: CAP vs Aspiration PNA and sepsis;  - Much improved on IV Rocephin and azithromycin - Mucinex for cough  - Xopenex Neb prn for SOB - Urine legionella NEG - Urine S. pneumococcal antigen NEG - Blood culture x2 neg thus far - Sputum culture with mod gram neg organisms - Respiratory virus panel neg - CT chest with contrast with B pleural effusions and atelectasis vs airspace disease - Obstruction noted in the small airways mainly in the LLL which may be due to mucus plugging. - Consulted Pulmonary who agrees with mucus plugging on CT. Possible aspiration PNA - Appreciate SLP input. Pt underwent esophogram with severe hiatal hernia noted. No obstruction - Clinically much improved with resolved leukocytosis. - Will transition to OP azithromycin and ceftin  Fall:  - pt/ot consulted - Cont on abx for CAP vs asp PNA and sepsis above. - Stable  Anxiety:  - Overall stable, no suicidal or homicidal ideations. -Continue home medications: Xanax PRN - Pt reports bouts of anxiety attacks  GERD: -Protonix  HTN:  -Amlodipine -Stable  DVT ppx: SCDs  Code Status: DNR Family Communication: Pt in room Disposition Plan: Pending   Consultants:    Procedures:    Antibiotics:  Rocephin 6/8>>>6/11  Azithromycin 6/8>>>  Ceftin 6/11>>>  HPI/Subjective: Feels much better but still has pain on swallowing  Objective: Filed Vitals:   02/06/15 0452 02/06/15 0750 02/06/15 0754 02/06/15 1423  BP: 132/85   136/63  Pulse: 75   77  Temp: 97.9 F (36.6 C)   97.9 F (36.6 C)  TempSrc: Oral   Oral  Resp: 20   20  Height:      Weight:      SpO2: 96% 95% 95% 96%    Intake/Output Summary (Last 24 hours) at 02/06/15 1440 Last data filed at 02/06/15 1402  Gross per 24 hour  Intake   2750 ml  Output   1925 ml  Net    825 ml   Filed Weights    02/03/15 1828 02/04/15 0648  Weight: 45.36 kg (100 lb) 55 kg (121 lb 4.1 oz)    Exam:   General:  Awake, laying in bed, in nad  Cardiovascular: regular, s1, s2  Respiratory: normal resp effort, [place] in place  Abdomen: soft, nondistended, pos BS, nontender  Musculoskeletal: perfused, no clubbing, no cyanosis  Data Reviewed: Basic Metabolic Panel:  Recent Labs Lab 02/03/15 1859  NA 140  K 3.6  CL 98*  CO2 29  GLUCOSE 144*  BUN 15  CREATININE 0.64  CALCIUM 9.0   Liver Function Tests:  Recent Labs Lab 02/03/15 1859  AST 25  ALT 11*  ALKPHOS 100  BILITOT 0.8  PROT 7.2  ALBUMIN 3.4*    Recent Labs Lab 02/04/15 0036  LIPASE 10*   No results for input(s): AMMONIA in the last 168 hours. CBC:  Recent Labs Lab 02/03/15 1859 02/05/15 0755 02/06/15 0507  WBC 38.0* 14.3* 9.0  NEUTROABS 33.1*  --   --   HGB 14.2 11.2* 10.7*  HCT 43.3 34.3* 33.5*  MCV 94.1 96.1 94.9  PLT 476* 377 389   Cardiac Enzymes:  Recent Labs Lab 02/03/15 1916  TROPONINI <0.03   BNP (last 3 results) No results for input(s): BNP in the last 8760 hours.  ProBNP (last 3 results) No results for input(s): PROBNP in the last 8760 hours.  CBG: No  results for input(s): GLUCAP in the last 168 hours.  Recent Results (from the past 240 hour(s))  Urine culture     Status: None   Collection Time: 02/03/15  7:28 PM  Result Value Ref Range Status   Specimen Description URINE, RANDOM  Final   Special Requests NONE  Final   Colony Count NO GROWTH Performed at [facility]   Final   Culture NO GROWTH Performed at [facility]   Final   Report Status 02/04/2015 FINAL  Final  Culture, blood (routine x 2)     Status: None (Preliminary result)   Collection Time: 02/04/15  4:04 AM  Result Value Ref Range Status   Specimen Description RIGHT ANTECUBITAL  Final   Special Requests BOTTLES DRAWN AEROBIC AND ANAEROBIC  Final   Culture   Final           BLOOD CULTURE  RECEIVED NO GROWTH TO DATE CULTURE WILL BE HELD FOR 5 DAYS BEFORE ISSUING A FINAL NEGATIVE REPORT Performed at [facility]    Report Status PENDING  Incomplete  Culture, blood (routine x 2)     Status: None (Preliminary result)   Collection Time: 02/04/15  4:04 AM  Result Value Ref Range Status   Specimen Description BLOOD RIGHT HAND  Final   Special Requests BOTTLES DRAWN AEROBIC AND ANAEROBIC  Final   Culture   Final           BLOOD CULTURE RECEIVED NO GROWTH TO DATE CULTURE WILL BE HELD FOR 5 DAYS BEFORE ISSUING A FINAL NEGATIVE REPORT Performed at [facility]    Report Status PENDING  Incomplete  Respiratory virus panel     Status: None   Collection Time: 02/04/15  8:00 AM  Result Value Ref Range Status   Source - RVPAN NASOPHARYNGEAL SWAB  Corrected   Respiratory Syncytial Virus A Negative Negative Final   Respiratory Syncytial Virus B Negative Negative Final   Influenza A Negative Negative Final   Influenza B Negative Negative Final   Parainfluenza 1 Negative Negative Final   Parainfluenza 2 Negative Negative Final   Parainfluenza 3 Negative Negative Final   Metapneumovirus Negative Negative Final   Rhinovirus Negative Negative Final   Adenovirus Negative Negative Final    Comment: (NOTE) Performed At: [facility] [address] [place], [place] [id number] Mila Homer MD WU:[id number]   Culture, respiratory (NON-Expectorated)     Status: None (Preliminary result)   Collection Time: 02/04/15  8:20 AM  Result Value Ref Range Status   Specimen Description SPUTUM  Final   Special Requests NONE  Final   Gram Stain   Final    RARE WBC PRESENT,BOTH PMN AND MONONUCLEAR RARE SQUAMOUS EPITHELIAL CELLS PRESENT MODERATE GRAM NEGATIVE RODS FEW GRAM POSITIVE COCCI IN PAIRS IN CLUSTERS RARE YEAST    Culture   Final    MODERATE GRAM NEGATIVE RODS Performed at [facility]    Report Status PENDING  Incomplete  Culture, sputum-assessment      Status: None   Collection Time: 02/04/15  8:30 AM  Result Value Ref Range Status   Specimen Description SPUTUM  Final   Special Requests NONE  Final   Sputum evaluation   Final    THIS SPECIMEN IS ACCEPTABLE. RESPIRATORY CULTURE REPORT TO FOLLOW.   Report Status 02/04/2015 FINAL  Final     Studies: Dg Chest 2 View  02/06/2015   CLINICAL DATA:  Acute respiratory failure with hypoxia. Shortness of  breath.  EXAM: CHEST  2 VIEW  COMPARISON:  Chest CT 02/04/2015.  Chest radiograph 02/03/2015.  FINDINGS: Cardiac silhouette is within normal limits for size. A moderately large hiatal hernia is again seen with a small amount of residual contrast projecting within. There are small bilateral pleural effusions, left larger than right and mildly increased in size compared to the prior radiographs. Left greater than right basilar airspace opacities have also increased from the prior radiographs although may not knee significantly changed from the more recent CT. Interstitial markings remain mildly prominent diffusely. No pneumothorax is identified. No acute osseous abnormality is seen.  IMPRESSION: Small bilateral pleural effusions and left greater than right basilar lung opacities, mildly increased from the prior radiographs and may reflect a combination of atelectasis and left lower lobe pneumonia.   Electronically Signed   By: Sebastian Ache   On: 02/06/2015 08:52   Dg Esophagus  02/05/2015   CLINICAL DATA:  History of hiatal hernia.  EXAM: ESOPHOGRAM/BARIUM SWALLOW  TECHNIQUE: Single contrast examination was performed using thin and thick barium.  FLUOROSCOPY TIME:  Fluoroscopy Time:  59 seconds  Number of Acquired Images:  0  COMPARISON:  None.  FINDINGS: Extremely limited examination secondary to difficulty in patient positioning. The entire examination was performed in the supine semi upright position, through a straw.  There was normal pharyngeal anatomy. There are tertiary contractions of the esophagus.  Contrast flowed freely through the esophagus without evidence of stricture or mass. There was normal esophageal mucosa without evidence of irregularity or ulceration. No evidence of reflux. There is a large hiatal hernia with the entirety of the stomach within the thorax with organoaxial malrotation without obstruction.  IMPRESSION: 1. Large hiatal hernia with the entirety of the stomach above the diaphragm. No gastric outlet obstruction.   Electronically Signed   By: Elige Ko   On: 02/05/2015 17:42    Scheduled Meds: . albuterol  2.5 mg Nebulization TID  . amLODipine  5 mg Oral Daily  . [START ON 02/07/2015] azithromycin  500 mg Oral Daily  . cefUROXime  250 mg Oral BID WC  . feeding supplement (ENSURE ENLIVE)  237 mL Oral TID BM  . guaiFENesin  600 mg Oral BID  . metoprolol tartrate  12.5 mg Oral BID  . pantoprazole  40 mg Oral Daily   Continuous Infusions:    Principal Problem:   CAP (community acquired pneumonia) Active Problems:   Meniere's disease   Unstable gait   Depression   Sepsis   Anxiety   Essential hypertension   Fall   Generalized weakness   GERD (gastroesophageal reflux disease)   Protein-calorie malnutrition, severe   CHIU, STEPHEN K  [facility] Pager [phone]. If 7PM-7AM, please contact night-coverage at [email or web address], password [facility] 02/06/2015, 2:40 PM  LOS: 2 days

## 2015-02-06 NOTE — Progress Notes (Addendum)
Occupational Therapy Treatment Patient Details Name: Nicole Key MRN: [id number] DOB: [birth year] Today's Date: 02/06/2015    History of present illness 81 y.o. female admitted with Pna.  PMH of hypertension, GERD, anxiety, fibromyalgia, vertigo, Mnire's disease, fibroymyalgia.   OT comments  Pt is declining d/c to SNF and wants to return home with family. She states that her daughter just recently had heart surgery but that her son-in law and other family can physically assist her. She states she has all DME. Pt reports only wanting to get up to EOB for grooming this visit and reports feeling weak and tired. Nursing made aware.   Follow Up Recommendations  SNF;Supervision/Assistance - 24 hour--would benefit from SNF but appears pt wants to d/c home so recommend HHOT if pt going home. Also recommend [facility] aide.    Equipment Recommendations  None recommended by OT    Recommendations for Other Services      Precautions / Restrictions Precautions Precautions: Fall Restrictions Weight Bearing Restrictions: No       Mobility Bed Mobility Overal bed mobility: Needs Assistance Bed Mobility: Supine to Sit;Sit to Supine     Supine to sit: Min assist Sit to supine: Min assist   General bed mobility comments: min assist also for side scoot toward HOB. verbal cues for self assisting.  Transfers                 General transfer comment: pt declined OOB at this time.    Balance     Sitting balance-Leahy Scale: Good                             ADL       Grooming: Supervision/safety;Wash/dry face;Sitting                                 General ADL Comments: Pt declined getting up to [facility] this visit. She states "I am so weak." explained benefit of OOB with therapy and pt agreeable to sit EOB. Pt did well with transferring to EOB with min assist. Tolerated 6 minutes of sitting to perform grooming tasks. Pt reports she plans to return home and not  d/c to SNF. She states she has plenty of family support.       Vision                     Perception     Praxis      Cognition   Behavior During Therapy: Flat affect Overall Cognitive Status: Within Functional Limits for tasks assessed                       Extremity/Trunk Assessment               Exercises     Shoulder Instructions       General Comments      Pertinent Vitals/ Pain       Pain Assessment: Faces Pain Score: 4  Pain Location: chest, abdomen Pain Descriptors / Indicators: Sore Pain Intervention(s): Repositioned  Home Living                                          Prior Functioning/Environment  Frequency Min 2X/week     Progress Toward Goals  OT Goals(current goals can now be found in the care plan section)  Progress towards OT goals: Not progressing toward goals - comment (pt fatigued and only wanted to sit EOB)     Plan Discharge plan needs to be updated    Co-evaluation                 End of Session     Activity Tolerance Patient limited by fatigue   Patient Left in bed;with call bell/phone within reach   Nurse Communication          Time: 1410-1425 OT Time Calculation (min): 15 min  Charges: OT General Charges $OT Visit: 1 Procedure OT Treatments $Self Care/Home Management : 8-22 mins  Lennox Laity  [phone] 02/06/2015, 2:40 PM

## 2015-02-07 DIAGNOSIS — K449 Diaphragmatic hernia without obstruction or gangrene: Secondary | ICD-10-CM

## 2015-02-07 DIAGNOSIS — K440 Diaphragmatic hernia with obstruction, without gangrene: Secondary | ICD-10-CM

## 2015-02-07 HISTORY — DX: Diaphragmatic hernia with obstruction, without gangrene: K44.0

## 2015-02-07 LAB — CBC
HEMATOCRIT: 32.5 % — AB (ref 36.0–46.0)
HEMOGLOBIN: 10.7 g/dL — AB (ref 12.0–15.0)
MCH: 30.8 pg (ref 26.0–34.0)
MCHC: 32.9 g/dL (ref 30.0–36.0)
MCV: 93.7 fL (ref 78.0–100.0)
Platelets: 416 10*3/uL — ABNORMAL HIGH (ref 150–400)
RBC: 3.47 MIL/uL — AB (ref 3.87–5.11)
RDW: 14.7 % (ref 11.5–15.5)
WBC: 7.9 10*3/uL (ref 4.0–10.5)

## 2015-02-07 LAB — CULTURE, RESPIRATORY

## 2015-02-07 LAB — CULTURE, RESPIRATORY W GRAM STAIN

## 2015-02-07 MED ORDER — AMOXICILLIN-POT CLAVULANATE 875-125 MG PO TABS
1.0000 | ORAL_TABLET | Freq: Two times a day (BID) | ORAL | Status: DC
  Administered 2015-02-07 – 2015-02-08 (×2): 1 via ORAL
  Filled 2015-02-07 (×2): qty 1

## 2015-02-07 NOTE — Consult Note (Signed)
[facility]  [address]  [place], [place] [address] Phone: [phone] FAX: [phone]     Nicole Key  May 03, 1928 [id number]  CARE TEAM:  PCP: [name], MD  Outpatient Care Team: Patient Care Team: Geoffry Paradise, MD as PCP - General (Internal Medicine)  Inpatient Treatment Team: Treatment Team: Attending Provider: Jerald Kief, MD; Rounding Team: Massie Kluver, MD; Registered Nurse: Jonelle Sidle Dunkelberger, RN; Technician: Francia Greaves, NT; Rounding Team: Md Pccm, MD; Registered Nurse: Marylyn Ishihara Madriaga-Acosta, RN; Technician: Hettie Holstein, NT; Consulting Physician: Bishop Limbo, MD  This patient is a 81 y.o.female who presents today for surgical evaluation at the request of Dr Rhona Leavens.   Reason for evaluation: Giant Hiatal hernia with N/V, aspiration PNA  Pleasant elderly woman and is struggling with worsening dysphagia.  Odynophagia.  Heartburn and reflux.  Nausea vomiting.  This is markedly worsened over the past 2 months.  Getting the point where even clear liquids are a challenge to tolerate swallowing.  Admitted with pneumonia.  Stabilized on IV antibiotics.  Followed by medicine and pulmonary.  Giant hiatal hernia.  Concern for causing obstruction with nausea vomiting and probably aspiration pneumonia.  Had hypoxia and leukocytosis.  Improving with pulmonary toilet, medical care, IV antibitics.  Swallow evaluation argues against upper dysphagia.  Surgical consultation requested to see if repair feasible to break the cycles of nausea/vomiting/pneumonias.  Patient lives at home with family.  Somewhat active but struggles with dizziness and falls secondary to Mnire's disease.  Grandson in room.  Eating less & less active over the past fwe months.  Certainly not bedridden.  Unfortunately has declined with worsening PO intake and dysphasia.  No history of biliary colic.  Dwindling treatment of heartburn and reflux to  oral medicines.  No personal nor family history of GI/colon cancer, inflammatory bowel disease, irritable bowel syndrome, allergy such as Celiac Sprue, dietary/dairy problems, colitis, ulcers nor gastritis.  No recent sick contacts/gastroenteritis.  No travel outside the country.  No changes in diet.  No hematochezia, hematemesis, coffee ground emesis.  No evidence of prior gastric/peptic ulceration.  No hepatitis.  No pancreatitis.  Had echocardiogram 2014 with ejection fraction 55-60 percent.  On oxygen at home.  Can walk a few blocks with her walker for balance.  Has been losing weight.  Recently made DO NOT RESUSCITATE.  Past Medical History  Diagnosis Date  . Insomnia   . Fibromyalgia   . Arthritis   . Meniere's disease 1989  . Hypertension   . Anxiety   . Vertigo   . GERD (gastroesophageal reflux disease)     Past Surgical History  Procedure Laterality Date  . Appendectomy    . Abdominal hysterectomy    . Bilateral total mastectomy with axillary lymph node dissection  1981    History   Social History  . Marital Status: Widowed    Spouse Name: N/A  . Number of Children: N/A  . Years of Education: N/A   Occupational History  . Not on file.   Social History Main Topics  . Smoking status: Never Smoker   . Smokeless tobacco: Never Used  . Alcohol Use: No     Comment: denies alcohol use  . Drug Use: No  . Sexual Activity: No   Other Topics Concern  . Not on file   Social History Narrative    Family History  Problem Relation Age of Onset  . Aortic aneurysm Mother  Had ruptured aorta  . Lung cancer Father   . Heart disease Father     Current Facility-Administered Medications  Medication Dose Route Frequency Provider Last Rate Last Dose  . albuterol (PROVENTIL) (2.5 MG/3ML) 0.083% nebulizer solution 2.5 mg  2.5 mg Nebulization TID Jerald Kief, MD   2.5 mg at 02/07/15 0850  . ALPRAZolam Prudy Feeler) tablet 0.25 mg  0.25 mg Oral BID PRN Lorretta Harp, MD   0.25 mg at  02/07/15 0850  . amLODipine (NORVASC) tablet 5 mg  5 mg Oral Daily Lorretta Harp, MD   5 mg at 02/06/15 [phone]  . azithromycin (ZITHROMAX) tablet 500 mg  500 mg Oral Daily Jerald Kief, MD      . benzonatate (TESSALON) capsule 100 mg  100 mg Oral TID PRN Jerald Kief, MD   100 mg at 02/07/15 0032  . cefUROXime (CEFTIN) tablet 250 mg  250 mg Oral BID WC Jerald Kief, MD   250 mg at 02/07/15 0850  . feeding supplement (ENSURE ENLIVE) (ENSURE ENLIVE) liquid 237 mL  237 mL Oral TID BM Lorretta Harp, MD   237 mL at 02/06/15 2000  . guaiFENesin (MUCINEX) 12 hr tablet 600 mg  600 mg Oral BID Lupita Leash, MD   600 mg at 02/06/15 2139  . HYDROcodone-acetaminophen (NORCO/VICODIN) 5-325 MG per tablet 1 tablet  1 tablet Oral Q6H PRN Lorretta Harp, MD   1 tablet at 02/07/15 0850  . hydrOXYzine (VISTARIL) injection 25 mg  25 mg Intramuscular Q6H PRN Lorretta Harp, MD      . ketorolac (TORADOL) 15 MG/ML injection 15 mg  15 mg Intravenous Q6H PRN Jerald Kief, MD   15 mg at 02/07/15 0041  . magic mouthwash w/lidocaine  5 mL Oral QID PRN Jerald Kief, MD   5 mL at 02/07/15 0032  . metoprolol tartrate (LOPRESSOR) tablet 12.5 mg  12.5 mg Oral BID Jerald Kief, MD   12.5 mg at 02/06/15 2139  . ondansetron (ZOFRAN) injection 4 mg  4 mg Intravenous Q6H PRN Jerald Kief, MD      . pantoprazole (PROTONIX) EC tablet 40 mg  40 mg Oral Daily Lorretta Harp, MD   40 mg at 02/06/15 [phone]  . traZODone (DESYREL) tablet 50 mg  50 mg Oral QHS PRN Jerald Kief, MD   50 mg at 02/06/15 2146     Allergies  Allergen Reactions  . Shellfish Allergy     unknown    ROS: Constitutional:  No fevers, chills, sweats.  Weight losse Eyes:  No vision changes, No discharge HENT:  No sore throats, nasal drainage Lymph: No neck swelling, No bruising easily Pulmonary:  + cough, productive sputum CV: No orthopnea, PND  Patient walks 5-10 minutes with walker.  No exertional chest/neck/shoulder/arm pain. GI:  No personal nor family history of  GI/colon cancer, inflammatory bowel disease, irritable bowel syndrome, allergy such as Celiac Sprue, dietary/dairy problems, colitis, ulcers nor gastritis.  No recent sick contacts/gastroenteritis.  No travel outside the country.  No changes in diet. Renal: No UTIs, No hematuria Genital:  No drainage, bleeding, masses Musculoskeletal: No severe joint pain.  Good ROM major joints Skin:  No sores or lesions.  No rashes Heme/Lymph:  No easy bleeding.  No swollen lymph nodes Neuro: No focal weakness/numbness.  No seizures Psych: No suicidal ideation.  No hallucinations  BP 133/61 mmHg  Pulse 73  Temp(Src) 98.9 F (37.2 C) (Oral)  Resp 18  Ht  5\' 8"  (1.727 m)  Wt 55 kg (121 lb 4.1 oz)  BMI 18.44 kg/m2  SpO2 94%  Physical Exam: General: Pt awake/alert/oriented x4 in no major acute distress Eyes: PERRL, normal EOM. Sclera nonicteric Neuro: CN II-XII intact w/o focal sensory/motor deficits. Lymph: No head/neck/groin lymphadenopathy Psych:  No delerium/psychosis/paranoia HENT: Normocephalic, Mucus membranes moist.  No thrush Neck: Supple, No tracheal deviation Chest: No pain.  Good respiratory excursion.  No conversational dyspnea.   CV:  Pulses intact.  Regular rhythm Abdomen: Soft, Nondistended.  Nontender.  No incarcerated hernias. Ext:  SCDs BLE.  No significant edema.  No cyanosis Skin: No petechiae / purpurea.  No major sores Musculoskeletal: No severe joint pain.  Good ROM major joints   Results:   Labs: Results for orders placed or performed during the hospital encounter of 02/03/15 (from the past 48 hour(s))  CBC     Status: Abnormal   Collection Time: 02/06/15  5:07 AM  Result Value Ref Range   WBC 9.0 4.0 - 10.5 K/uL   RBC 3.53 (L) 3.87 - 5.11 MIL/uL   Hemoglobin 10.7 (L) 12.0 - 15.0 g/dL   HCT 09.6 (L) 04.5 - 40.9 %   MCV 94.9 78.0 - 100.0 fL   MCH 30.3 26.0 - 34.0 pg   MCHC 31.9 30.0 - 36.0 g/dL   RDW 81.1 91.4 - 78.2 %   Platelets 389 150 - 400 K/uL  CBC      Status: Abnormal   Collection Time: 02/07/15  4:15 AM  Result Value Ref Range   WBC 7.9 4.0 - 10.5 K/uL   RBC 3.47 (L) 3.87 - 5.11 MIL/uL   Hemoglobin 10.7 (L) 12.0 - 15.0 g/dL   HCT 95.6 (L) 21.3 - 08.6 %   MCV 93.7 78.0 - 100.0 fL   MCH 30.8 26.0 - 34.0 pg   MCHC 32.9 30.0 - 36.0 g/dL   RDW 57.8 46.9 - 62.9 %   Platelets 416 (H) 150 - 400 K/uL    Imaging / Studies: Dg Chest 2 View  02/06/2015   CLINICAL DATA:  Acute respiratory failure with hypoxia. Shortness of breath.  EXAM: CHEST  2 VIEW  COMPARISON:  Chest CT 02/04/2015.  Chest radiograph 02/03/2015.  FINDINGS: Cardiac silhouette is within normal limits for size. A moderately large hiatal hernia is again seen with a small amount of residual contrast projecting within. There are small bilateral pleural effusions, left larger than right and mildly increased in size compared to the prior radiographs. Left greater than right basilar airspace opacities have also increased from the prior radiographs although may not knee significantly changed from the more recent CT. Interstitial markings remain mildly prominent diffusely. No pneumothorax is identified. No acute osseous abnormality is seen.  IMPRESSION: Small bilateral pleural effusions and left greater than right basilar lung opacities, mildly increased from the prior radiographs and may reflect a combination of atelectasis and left lower lobe pneumonia.   Electronically Signed   By: Sebastian Ache   On: 02/06/2015 08:52   Dg Chest 2 View  02/03/2015   CLINICAL DATA:  81 year old female with history of trauma from a fall 1 week ago. Poor appetite, weakness and lethargy since that time. Emesis.  EXAM: CHEST  2 VIEW  COMPARISON:  Chest x-ray 12/17/2013.  FINDINGS: Lung volumes are low. Opacity at the left lung base may reflect atelectasis and/or consolidation. Small left pleural effusion. Right lung is clear. No evidence of pulmonary edema. Mild diffuse interstitial prominence, similar to  numerous  prior examinations. Large hiatal hernia. Heart size is normal. Upper mediastinal contours are within normal limits. Atherosclerosis in the thoracic aorta.  IMPRESSION: 1. Atelectasis and/or consolidation in the left lower lobe. Small left-sided pleural effusion. Followup PA and lateral chest X-ray is recommended in 3-4 weeks following trial of antibiotic therapy to ensure resolution and exclude underlying malignancy. 2. Moderate sized hiatal hernia. 3. Atherosclerosis.   Electronically Signed   By: Trudie Reed M.D.   On: 02/03/2015 19:26   Ct Head Wo Contrast  02/03/2015   CLINICAL DATA:  81 year old female with history of trauma from a fall at home 1 week ago with injury to the face. Visible hematoma over the left cheek bone. Weakness and lethargy. Emesis for 1 week.  EXAM: CT HEAD WITHOUT CONTRAST  CT MAXILLOFACIAL WITHOUT CONTRAST  CT CERVICAL SPINE WITHOUT CONTRAST  TECHNIQUE: Multidetector CT imaging of the head, cervical spine, and maxillofacial structures were performed using the standard protocol without intravenous contrast. Multiplanar CT image reconstructions of the cervical spine and maxillofacial structures were also generated.  COMPARISON:  PET-CT 12/17/2013.  Cervical spine CT 12/17/2013.  FINDINGS: CT HEAD FINDINGS  Patchy and confluent areas of decreased attenuation are noted throughout the deep and periventricular white matter of the cerebral hemispheres bilaterally, compatible with chronic microvascular ischemic disease. No acute displaced skull fractures are identified. No acute intracranial abnormality. Specifically, no evidence of acute post-traumatic intracranial hemorrhage, no definite regions of acute/subacute cerebral ischemia, no focal mass, mass effect, hydrocephalus or abnormal intra or extra-axial fluid collections. The visualized paranasal sinuses and mastoids are well pneumatized.  CT MAXILLOFACIAL FINDINGS  Extensive high attenuation soft tissue swelling overlying the left  maxilla, compatible with the reported hematoma. No underlying displaced facial bone fractures. Pterygoid plates are intact. Mandibular condyles are located bilaterally. Bilateral globes and retro-orbital soft tissues are grossly normal in appearance. The patient is edentulous.  CT CERVICAL SPINE FINDINGS  No acute displaced cervical spine fractures. Exaggeration of normal cervical lordosis, likely chronic. Alignment is otherwise anatomic. Mild multilevel degenerative disc disease and facet arthropathy. Visualized portions of the upper thorax are remarkable for scattered areas of mild septal thickening and some bilateral apical pleuroparenchymal thickening, presumably areas of post infectious or inflammatory scarring.  IMPRESSION: 1. Large hematoma overlying the left maxilla. No underlying displaced facial bone fractures. 2. No acute intracranial abnormalities. 3. No evidence of significant acute traumatic injury to the cervical spine. 4. Chronic microvascular ischemic changes in the cerebral white matter. 5. Mild multilevel degenerative disc disease and cervical spondylosis, as above. 6. Additional incidental findings, as above.   Electronically Signed   By: Trudie Reed M.D.   On: 02/03/2015 21:28   Ct Chest W Contrast  02/04/2015   CLINICAL DATA:  Hemoptysis  EXAM: CT CHEST WITH CONTRAST  TECHNIQUE: Multidetector CT imaging of the chest was performed during intravenous contrast administration.  CONTRAST:  50mL OMNIPAQUE IOHEXOL 300 MG/ML  SOLN  COMPARISON:  None.  FINDINGS: Tiny hypodensity in the right lobe of the thyroid gland and image 8.  10 mm Inda axis diameter precarinal node on image 22. There are enhancing soft tissue densities below the carina on image 26 which may represent a conglomeration of small lymph nodes. Leggette axis diameter is 9 mm.  There is mild wall thickening of the entire length of the esophagus. No mediastinal emphysema. There is fluid density within the posterior and lower  mediastinum adjacent to the hiatal hernia.  Large hiatal hernia containing much of  the stomach is present.  Bilateral small pleural effusions.  No pneumothorax.  Left lower lobe consolidation versus collapse is present. This is associated with intermittent obstruction of the central left lower lobe airways. It is difficult to determine if this is due to mucous plugging or an endobronchial lesion. There is some volume loss at the right lung base. There may be some material within the right lower lobe airways. Ground-glass opacities in the right upper lobe are noted. The largest is 2.7 x 2.0 cm.  Stable mild T10 compression compare with prior CT abdominal imaging. Healed or healing left posterior rib fractures are present.  Stable appearance of the abdomen compared yesterday.  IMPRESSION: Bilateral pleural effusions and bibasilar atelectasis versus airspace disease as described. There is obstruction of the small airways particularly in the left lower lobe. This may be due to mucous plugging, however endobronchial lesion is not excluded. Bronchoscopy may be helpful.  Ground-glass opacities in the right upper lobe as described. This is a nonspecific finding. Followup in 3 months is recommended by CT to ensure resolution.  Large hiatal hernia  There is wall thickening of the esophagus. There is also fluid density within the mediastinum adjacent to the esophagus. There is no emphysema. Inflammatory process is not excluded.  Mild mediastinal adenopathy. Inflammatory and neoplastic etiology are considerations. Follow-up CT in 3 months is recommended to ensure resolution. Alternatively, PET-CT can be performed.   Electronically Signed   By: Jolaine Click M.D.   On: 02/04/2015 11:24   Ct Cervical Spine Wo Contrast  02/03/2015   CLINICAL DATA:  81 year old female with history of trauma from a fall at home 1 week ago with injury to the face. Visible hematoma over the left cheek bone. Weakness and lethargy. Emesis for 1 week.   EXAM: CT HEAD WITHOUT CONTRAST  CT MAXILLOFACIAL WITHOUT CONTRAST  CT CERVICAL SPINE WITHOUT CONTRAST  TECHNIQUE: Multidetector CT imaging of the head, cervical spine, and maxillofacial structures were performed using the standard protocol without intravenous contrast. Multiplanar CT image reconstructions of the cervical spine and maxillofacial structures were also generated.  COMPARISON:  PET-CT 12/17/2013.  Cervical spine CT 12/17/2013.  FINDINGS: CT HEAD FINDINGS  Patchy and confluent areas of decreased attenuation are noted throughout the deep and periventricular white matter of the cerebral hemispheres bilaterally, compatible with chronic microvascular ischemic disease. No acute displaced skull fractures are identified. No acute intracranial abnormality. Specifically, no evidence of acute post-traumatic intracranial hemorrhage, no definite regions of acute/subacute cerebral ischemia, no focal mass, mass effect, hydrocephalus or abnormal intra or extra-axial fluid collections. The visualized paranasal sinuses and mastoids are well pneumatized.  CT MAXILLOFACIAL FINDINGS  Extensive high attenuation soft tissue swelling overlying the left maxilla, compatible with the reported hematoma. No underlying displaced facial bone fractures. Pterygoid plates are intact. Mandibular condyles are located bilaterally. Bilateral globes and retro-orbital soft tissues are grossly normal in appearance. The patient is edentulous.  CT CERVICAL SPINE FINDINGS  No acute displaced cervical spine fractures. Exaggeration of normal cervical lordosis, likely chronic. Alignment is otherwise anatomic. Mild multilevel degenerative disc disease and facet arthropathy. Visualized portions of the upper thorax are remarkable for scattered areas of mild septal thickening and some bilateral apical pleuroparenchymal thickening, presumably areas of post infectious or inflammatory scarring.  IMPRESSION: 1. Large hematoma overlying the left maxilla. No  underlying displaced facial bone fractures. 2. No acute intracranial abnormalities. 3. No evidence of significant acute traumatic injury to the cervical spine. 4. Chronic microvascular ischemic changes in the  cerebral white matter. 5. Mild multilevel degenerative disc disease and cervical spondylosis, as above. 6. Additional incidental findings, as above.   Electronically Signed   By: Trudie Reed M.D.   On: 02/03/2015 21:28   Ct Abdomen Pelvis W Contrast  02/04/2015   CLINICAL DATA:  Weakness, nausea and vomiting.  Leukocytosis.  EXAM: CT ABDOMEN AND PELVIS WITH CONTRAST  TECHNIQUE: Multidetector CT imaging of the abdomen and pelvis was performed using the standard protocol following bolus administration of intravenous contrast.  CONTRAST:  80mL OMNIPAQUE IOHEXOL 300 MG/ML  SOLN  COMPARISON:  08/14/2011  FINDINGS: Lower chest: There is a small left pleural effusion. There is consolidation and volume loss in the left lower lobe. There is a large hiatal hernia.  Hepatobiliary: There are a few small cysts in the liver, the largest measuring 5 mm. These are unchanged from 08/14/2011. No significant liver lesion. Gallbladder and bile ducts appear unremarkable.  Pancreas: Normal  Spleen: Normal  Adrenals/Urinary Tract: The adrenals and kidneys are normal in appearance. There is no urinary calculus evident. There is no hydronephrosis or ureteral dilatation. Collecting systems and ureters appear unremarkable. Urinary bladder is unremarkable.  Stomach/Bowel: Most of the stomach is above the diaphragm. Small bowel is unremarkable. There is extensive colonic diverticulosis without evidence of diverticulitis.  Vascular/Lymphatic: The abdominal aorta is normal in caliber. There is mild atherosclerotic calcification. There is no adenopathy in the abdomen or pelvis.  Reproductive: There is hysterectomy.  No adnexal abnormalities.  Other: No acute inflammatory changes are evident in the abdomen or pelvis. There is no  ascites.  Musculoskeletal: There is no significant musculoskeletal lesion. There is moderately severe degenerative disc change at L5-S1  IMPRESSION: 1. Left lower lobe consolidation and volume loss. Small left pleural effusion. Given the clinical history, this could represent pneumonia with a parapneumonic effusion. 2. Sub cm benign hepatic cysts. 3. Large hiatal hernia. 4. Diverticulosis.   Electronically Signed   By: Ellery Plunk M.D.   On: 02/04/2015 03:22   Dg Esophagus  02/05/2015   CLINICAL DATA:  History of hiatal hernia.  EXAM: ESOPHOGRAM/BARIUM SWALLOW  TECHNIQUE: Single contrast examination was performed using thin and thick barium.  FLUOROSCOPY TIME:  Fluoroscopy Time:  59 seconds  Number of Acquired Images:  0  COMPARISON:  None.  FINDINGS: Extremely limited examination secondary to difficulty in patient positioning. The entire examination was performed in the supine semi upright position, through a straw.  There was normal pharyngeal anatomy. There are tertiary contractions of the esophagus. Contrast flowed freely through the esophagus without evidence of stricture or mass. There was normal esophageal mucosa without evidence of irregularity or ulceration. No evidence of reflux. There is a large hiatal hernia with the entirety of the stomach within the thorax with organoaxial malrotation without obstruction.  IMPRESSION: 1. Large hiatal hernia with the entirety of the stomach above the diaphragm. No gastric outlet obstruction.   Electronically Signed   By: Elige Ko   On: 02/05/2015 17:42   Ct Maxillofacial Wo Cm  02/03/2015   CLINICAL DATA:  81 year old female with history of trauma from a fall at home 1 week ago with injury to the face. Visible hematoma over the left cheek bone. Weakness and lethargy. Emesis for 1 week.  EXAM: CT HEAD WITHOUT CONTRAST  CT MAXILLOFACIAL WITHOUT CONTRAST  CT CERVICAL SPINE WITHOUT CONTRAST  TECHNIQUE: Multidetector CT imaging of the head, cervical spine, and  maxillofacial structures were performed using the standard protocol without intravenous contrast. Multiplanar  CT image reconstructions of the cervical spine and maxillofacial structures were also generated.  COMPARISON:  PET-CT 12/17/2013.  Cervical spine CT 12/17/2013.  FINDINGS: CT HEAD FINDINGS  Patchy and confluent areas of decreased attenuation are noted throughout the deep and periventricular white matter of the cerebral hemispheres bilaterally, compatible with chronic microvascular ischemic disease. No acute displaced skull fractures are identified. No acute intracranial abnormality. Specifically, no evidence of acute post-traumatic intracranial hemorrhage, no definite regions of acute/subacute cerebral ischemia, no focal mass, mass effect, hydrocephalus or abnormal intra or extra-axial fluid collections. The visualized paranasal sinuses and mastoids are well pneumatized.  CT MAXILLOFACIAL FINDINGS  Extensive high attenuation soft tissue swelling overlying the left maxilla, compatible with the reported hematoma. No underlying displaced facial bone fractures. Pterygoid plates are intact. Mandibular condyles are located bilaterally. Bilateral globes and retro-orbital soft tissues are grossly normal in appearance. The patient is edentulous.  CT CERVICAL SPINE FINDINGS  No acute displaced cervical spine fractures. Exaggeration of normal cervical lordosis, likely chronic. Alignment is otherwise anatomic. Mild multilevel degenerative disc disease and facet arthropathy. Visualized portions of the upper thorax are remarkable for scattered areas of mild septal thickening and some bilateral apical pleuroparenchymal thickening, presumably areas of post infectious or inflammatory scarring.  IMPRESSION: 1. Large hematoma overlying the left maxilla. No underlying displaced facial bone fractures. 2. No acute intracranial abnormalities. 3. No evidence of significant acute traumatic injury to the cervical spine. 4. Chronic  microvascular ischemic changes in the cerebral white matter. 5. Mild multilevel degenerative disc disease and cervical spondylosis, as above. 6. Additional incidental findings, as above.   Electronically Signed   By: Trudie Reed M.D.   On: 02/03/2015 21:28    Medications / Allergies: per chart  Antibiotics: Anti-infectives    Start     Dose/Rate Route Frequency Ordered Stop   02/07/15 1000  azithromycin (ZITHROMAX) tablet 500 mg     500 mg Oral Daily 02/06/15 1054     02/06/15 1700  cefUROXime (CEFTIN) tablet 250 mg     250 mg Oral 2 times daily with meals 02/06/15 1054     02/04/15 0530  cefTRIAXone (ROCEPHIN) 1 g in dextrose 5 % 50 mL IVPB  Status:  Discontinued     1 g 100 mL/hr over 30 Minutes Intravenous Every 24 hours 02/04/15 0523 02/04/15 0639   02/04/15 0530  cefTRIAXone (ROCEPHIN) 1 g in dextrose 5 % 50 mL IVPB  Status:  Discontinued     1 g 100 mL/hr over 30 Minutes Intravenous Every 24 hours 02/04/15 0523 02/06/15 1054   02/04/15 0530  azithromycin (ZITHROMAX) 500 mg in dextrose 5 % 250 mL IVPB  Status:  Discontinued     500 mg 250 mL/hr over 60 Minutes Intravenous Every 24 hours 02/04/15 0523 02/06/15 1054   02/04/15 0345  cefTRIAXone (ROCEPHIN) 1 g in dextrose 5 % 50 mL IVPB     1 g 100 mL/hr over 30 Minutes Intravenous  Once 02/04/15 0335 02/04/15 0434   02/04/15 0345  azithromycin (ZITHROMAX) 500 mg in dextrose 5 % 250 mL IVPB     500 mg 250 mL/hr over 60 Minutes Intravenous  Once 02/04/15 0335 02/04/15 1900      Assessment  Nicole Key  81 y.o. female       Problem List:  Principal Problem:   CAP (community acquired pneumonia) Active Problems:   Meniere's disease   Unstable gait   Depression   Sepsis   Anxiety   Essential  hypertension   Fall   Generalized weakness   GERD (gastroesophageal reflux disease)   Protein-calorie malnutrition, severe   Acute respiratory failure with hypoxia   Aspiration pneumonia  Large hiatal hernia with  worsening dysphagia and nausea vomiting and now aspiration pneumonia.  No serious dysphagia issues proximally by SLP eval.  Dwindling oral intake with weight loss and deconditioning.  Plan:  I think she would benefit from surgery to reduce and repair her hiatal hernia.  Break this cycle of nausea/vomiting and aspiration.  Obviously her risks of surgery are elevated given her deconditioned state and recent pneumonia.  Ideally if she could improve her nutritional status and wait a few weeks, surgery would be less risky.  However, I do not see any other alternatives to further decline in oral intake and worsening nausea/vomiting without repair of the hiatal hernia.  While it is a large hiatal hernia, I think it is technically feasible to do surgery to repair / reduce.  Reasonable to start out laparoscopically.  Probably would benefit from [place] 1cm Nissen or partial fundoplication to help control heartburn and reflux.  Abdominal compartment not prohibitive with only lower pelvic surgery  The bigger challenge is from a cardiovascular /pulmonary & deconditioned standpoint - would she was able to tolerate this.  She has fair exercise tolerance and advanced age.  However echocardiogram actually had pretty good ejection fraction function 2014.  She stable on room air and recovered from her pneumonia relatively well.  Initial impression from Dr. Rhona Leavens with [facility] is she could tolerate surgery.  I am assuming that pulmonary is not closed to surgery since they requested the consultation.  We will see what Dr. Shelle Iron thinks tomorrow.  The patient is open to the idea of surgery and is considering it.  She does not want to stay like this, let alone worsen.  While DNR, it does not sound like she is wanting Hospice.  The grandson is excited about the idea of surgery helping her.  I did caution with her advanced age her risks are increased but not absolutely prohibitive.  Surgery will follow.  Dr Ezzard Standing to  re-evaluate the patient with the help of medicine & pulmonary.    VTE prophylaxis- SCDs, etc  Mobilize as tolerated to help recovery - PT/OT evals    Ardeth Sportsman, M.D., F.A.C.S. Gastrointestinal and Minimally Invasive Surgery [facility] [address] [place], [place] [address] [phone] Main / Paging   02/07/2015  Note: Portions of this report may have been transcribed using voice recognition software. Every effort was made to ensure accuracy; however, inadvertent computerized transcription errors may be present.   Any transcriptional errors that result from this process are unintentional.

## 2015-02-07 NOTE — Progress Notes (Signed)
[facility] PROGRESS NOTE  [name] OZH:[id number] DOB: 12-Feb-1928 DOA: 02/03/2015 PCP: [name], MD  Assessment/Plan: CAP vs Aspiration PNA and sepsis;  - Much improved on IV Rocephin and azithromycin - Mucinex for cough  - Xopenex Neb prn for SOB - Urine legionella NEG - Urine S. pneumococcal antigen NEG - Blood culture x2 neg thus far - Sputum culture with mod gram Pseudomonas - Respiratory virus panel neg - CT chest with contrast with B pleural effusions and atelectasis vs airspace disease - Obstruction noted in the small airways mainly in the LLL which may be due to mucus plugging. - Consulted Pulmonary who agrees with mucus plugging on CT. Possible aspiration PNA - Appreciate SLP input. Pt underwent esophogram with severe hiatal hernia noted. No obstruction - Clinically much improved with resolved leukocytosis. - Had initially transitiond to PO azithromycin and ceftin - Further transition to augmentin given culture results  Fall:  - pt/ot consulted - pt declines SNF, wants to go home - Cont on abx for CAP vs asp PNA and sepsis above. - Stable  Anxiety:  - Overall stable, no suicidal or homicidal ideations. -Continue home medications: Xanax PRN - Pt reports bouts of anxiety attacks  GERD: -Protonix  HTN:  -Amlodipine -Stable  DVT ppx: SCDs  Code Status: DNR Family Communication: Pt in room Disposition Plan: Pending   Consultants:    Procedures:    Antibiotics:  Rocephin 6/8>>>6/11  Azithromycin 6/8>>>  Ceftin 6/11>>>  HPI/Subjective: Continues to feel better.  Objective: Filed Vitals:   02/07/15 0850 02/07/15 0951 02/07/15 1346 02/07/15 1500  BP:  156/59  116/56  Pulse:  77  88  Temp:    98.6 F (37 C)  TempSrc:    Oral  Resp:    20  Height:      Weight:      SpO2: 94%  93% 94%    Intake/Output Summary (Last 24 hours) at 02/07/15 1636 Last data filed at 02/07/15 1500  Gross per 24 hour  Intake    120 ml   Output   1000 ml  Net   -880 ml   Filed Weights   02/03/15 1828 02/04/15 0648  Weight: 45.36 kg (100 lb) 55 kg (121 lb 4.1 oz)    Exam:   General:  Awake, laying in bed, in nad  Cardiovascular: regular, s1, s2  Respiratory: normal resp effort, [place] in place  Abdomen: soft, nondistended, pos BS, nontender  Musculoskeletal: perfused, no clubbing, no cyanosis  Data Reviewed: Basic Metabolic Panel:  Recent Labs Lab 02/03/15 1859  NA 140  Key 3.6  CL 98*  CO2 29  GLUCOSE 144*  BUN 15  CREATININE 0.64  CALCIUM 9.0   Liver Function Tests:  Recent Labs Lab 02/03/15 1859  AST 25  ALT 11*  ALKPHOS 100  BILITOT 0.8  PROT 7.2  ALBUMIN 3.4*    Recent Labs Lab 02/04/15 0036  LIPASE 10*   No results for input(s): AMMONIA in the last 168 hours. CBC:  Recent Labs Lab 02/03/15 1859 02/05/15 0755 02/06/15 0507 02/07/15 0415  WBC 38.0* 14.3* 9.0 7.9  NEUTROABS 33.1*  --   --   --   HGB 14.2 11.2* 10.7* 10.7*  HCT 43.3 34.3* 33.5* 32.5*  MCV 94.1 96.1 94.9 93.7  PLT 476* 377 389 416*   Cardiac Enzymes:  Recent Labs Lab 02/03/15 1916  TROPONINI <0.03   BNP (last 3 results) No results for input(s): BNP in the last 8760 hours.  ProBNP (last 3 results) No results for input(s): PROBNP in the last 8760 hours.  CBG: No results for input(s): GLUCAP in the last 168 hours.  Recent Results (from the past 240 hour(s))  Urine culture     Status: None   Collection Time: 02/03/15  7:28 PM  Result Value Ref Range Status   Specimen Description URINE, RANDOM  Final   Special Requests NONE  Final   Colony Count NO GROWTH Performed at [facility]   Final   Culture NO GROWTH Performed at [facility]   Final   Report Status 02/04/2015 FINAL  Final  Culture, blood (routine x 2)     Status: None (Preliminary result)   Collection Time: 02/04/15  4:04 AM  Result Value Ref Range Status   Specimen Description RIGHT ANTECUBITAL  Final   Special  Requests BOTTLES DRAWN AEROBIC AND ANAEROBIC  Final   Culture   Final           BLOOD CULTURE RECEIVED NO GROWTH TO DATE CULTURE WILL BE HELD FOR 5 DAYS BEFORE ISSUING A FINAL NEGATIVE REPORT Performed at [facility]    Report Status PENDING  Incomplete  Culture, blood (routine x 2)     Status: None (Preliminary result)   Collection Time: 02/04/15  4:04 AM  Result Value Ref Range Status   Specimen Description BLOOD RIGHT HAND  Final   Special Requests BOTTLES DRAWN AEROBIC AND ANAEROBIC  Final   Culture   Final           BLOOD CULTURE RECEIVED NO GROWTH TO DATE CULTURE WILL BE HELD FOR 5 DAYS BEFORE ISSUING A FINAL NEGATIVE REPORT Performed at [facility]    Report Status PENDING  Incomplete  Respiratory virus panel     Status: None   Collection Time: 02/04/15  8:00 AM  Result Value Ref Range Status   Source - RVPAN NASOPHARYNGEAL SWAB  Corrected   Respiratory Syncytial Virus A Negative Negative Final   Respiratory Syncytial Virus B Negative Negative Final   Influenza A Negative Negative Final   Influenza B Negative Negative Final   Parainfluenza 1 Negative Negative Final   Parainfluenza 2 Negative Negative Final   Parainfluenza 3 Negative Negative Final   Metapneumovirus Negative Negative Final   Rhinovirus Negative Negative Final   Adenovirus Negative Negative Final    Comment: (NOTE) Performed At: [facility] [address] [place], [place] [id number] Mila Homer MD JX:[id number]   Culture, respiratory (NON-Expectorated)     Status: None   Collection Time: 02/04/15  8:20 AM  Result Value Ref Range Status   Specimen Description SPUTUM  Final   Special Requests NONE  Final   Gram Stain   Final    RARE WBC PRESENT,BOTH PMN AND MONONUCLEAR RARE SQUAMOUS EPITHELIAL CELLS PRESENT MODERATE GRAM NEGATIVE RODS FEW GRAM POSITIVE COCCI IN PAIRS IN CLUSTERS RARE YEAST    Culture   Final    MODERATE PSEUDOMONAS AERUGINOSA Performed at [facility]    Report Status 02/07/2015 FINAL  Final   Organism ID, Bacteria PSEUDOMONAS AERUGINOSA  Final      Susceptibility   Pseudomonas aeruginosa - MIC*    CEFEPIME 4 SENSITIVE Sensitive     CEFTAZIDIME 4 SENSITIVE Sensitive     CIPROFLOXACIN <=0.25 SENSITIVE Sensitive     GENTAMICIN 2 SENSITIVE Sensitive     IMIPENEM 2 SENSITIVE Sensitive     PIP/TAZO 8 SENSITIVE Sensitive  TOBRAMYCIN <=1 SENSITIVE Sensitive     * MODERATE PSEUDOMONAS AERUGINOSA  Culture, sputum-assessment     Status: None   Collection Time: 02/04/15  8:30 AM  Result Value Ref Range Status   Specimen Description SPUTUM  Final   Special Requests NONE  Final   Sputum evaluation   Final    THIS SPECIMEN IS ACCEPTABLE. RESPIRATORY CULTURE REPORT TO FOLLOW.   Report Status 02/04/2015 FINAL  Final     Studies: Dg Chest 2 View  02/06/2015   CLINICAL DATA:  Acute respiratory failure with hypoxia. Shortness of breath.  EXAM: CHEST  2 VIEW  COMPARISON:  Chest CT 02/04/2015.  Chest radiograph 02/03/2015.  FINDINGS: Cardiac silhouette is within normal limits for size. A moderately large hiatal hernia is again seen with a small amount of residual contrast projecting within. There are small bilateral pleural effusions, left larger than right and mildly increased in size compared to the prior radiographs. Left greater than right basilar airspace opacities have also increased from the prior radiographs although may not knee significantly changed from the more recent CT. Interstitial markings remain mildly prominent diffusely. No pneumothorax is identified. No acute osseous abnormality is seen.  IMPRESSION: Small bilateral pleural effusions and left greater than right basilar lung opacities, mildly increased from the prior radiographs and may reflect a combination of atelectasis and left lower lobe pneumonia.   Electronically Signed   By: Sebastian Ache   On: 02/06/2015 08:52   Dg Esophagus  02/05/2015   CLINICAL DATA:  History of  hiatal hernia.  EXAM: ESOPHOGRAM/BARIUM SWALLOW  TECHNIQUE: Single contrast examination was performed using thin and thick barium.  FLUOROSCOPY TIME:  Fluoroscopy Time:  59 seconds  Number of Acquired Images:  0  COMPARISON:  None.  FINDINGS: Extremely limited examination secondary to difficulty in patient positioning. The entire examination was performed in the supine semi upright position, through a straw.  There was normal pharyngeal anatomy. There are tertiary contractions of the esophagus. Contrast flowed freely through the esophagus without evidence of stricture or mass. There was normal esophageal mucosa without evidence of irregularity or ulceration. No evidence of reflux. There is a large hiatal hernia with the entirety of the stomach within the thorax with organoaxial malrotation without obstruction.  IMPRESSION: 1. Large hiatal hernia with the entirety of the stomach above the diaphragm. No gastric outlet obstruction.   Electronically Signed   By: Elige Ko   On: 02/05/2015 17:42    Scheduled Meds: . albuterol  2.5 mg Nebulization TID  . amLODipine  5 mg Oral Daily  . amoxicillin-clavulanate  1 tablet Oral Q12H  . feeding supplement (ENSURE ENLIVE)  237 mL Oral TID BM  . guaiFENesin  600 mg Oral BID  . metoprolol tartrate  12.5 mg Oral BID  . pantoprazole  40 mg Oral Daily   Continuous Infusions:    Principal Problem:   CAP (community acquired pneumonia) Active Problems:   Meniere's disease   Unstable gait   Depression   Sepsis   Anxiety   Essential hypertension   Fall   Generalized weakness   GERD (gastroesophageal reflux disease)   Protein-calorie malnutrition, severe   Acute respiratory failure with hypoxia   Aspiration pneumonia   Incarcerated hiatal hernia   Nicole Key, Nicole Key  [facility] Pager [phone]. If 7PM-7AM, please contact night-coverage at [email or web address], password [facility] 02/07/2015, 4:36 PM  LOS: 3 days

## 2015-02-07 NOTE — Clinical Social Work Note (Signed)
Clinical Social Work Assessment  Patient Details  Name: Nicole Key MRN: [id number] Date of Birth: [birth year]  Date of referral:  02/06/15               Reason for consult:  Facility Placement                Permission sought to share information with:  Family Supports Permission granted to share information::  Yes, Verbal Permission Granted  Name::        Agency::     Relationship::     Contact Information:     Housing/Transportation Living arrangements for the past 2 months:  Single Family Home Source of Information:  Patient, Adult Children Patient Interpreter Needed:  None Criminal Activity/Legal Involvement Pertinent to Current Situation/Hospitalization:    Significant Relationships:  Adult Children Lives with:  Adult Children Do you feel safe going back to the place where you live?  Yes Need for family participation in patient care:  No (Coment)  Care giving concerns:  Pt's son and daughter in law are concerned with pt going home at discharge.  Pt's son and daughter in law discussed that pt has been living in her home with her grown daughter and son in law for a couple of years.  Family states that pt does not get care from family as they both have physical and mental health problems and have not been supportive to pt for a while.  Pt's grown daughter had heart surgery some time ago and stays in bed all day stating that her doctors have told her to stay in bed 24/7.  Pt's son in law also has physical and mental health issues that keep him from helping pt in the home.  Both pt's  daughter and her son in law are currently on disability and receiving a combined value of 2000 a month but pt pays for all of the expenses of the home which leaves her with no money to be able to pay for any help for herself in the home.  Pt's family stated that they have visited pt at the end of the day in the past and she has not had any meals sometimes eating crackers.  Pt's family believes that pt  "wants to die" and stated that she did try and commit suicide twice a couple of years ago by taking pills.  Pt's son came home and found that pt had taken an overdose.  Family is not aware of any mental health treatment for pt  Social Worker assessment / plan:  CSW met with pt and her son and daughter in law at bedside.  CSW explained role and PT evaluation to both pt and her family.  CSW provided an explanation of SNF process and encouraged pt to discuss thoughts and feelings related to rehab.  CSW prompted both pt and her family to discuss history and current needs.  CSW provided information about SNF bed offers.  CSW provided active and supportive listening.  CSW asked pt to talk about her support in her home and community.  CSW discussed concern with pt's MD if pt goes home instead of SNF with no support in the home. CSW discussed referring adult protective services for pt with MD and he agreed this was a good idea as pt has a right to her own decision making.    Employment status:  Retired Advertising copywriter PT Recommendations:  [place] / Referral to community resources:  Patient/Family's Response to care:  When pt and CSW met alone pt talked about living with her daughter and son in law and did state that her daughter was not healthy at this time.  Pt talked about her history stating that she has never gone to rehab but she did have nurses come out to her home in the past.  Pt stated "I want to go home" and refused any SNF bed for rehab.  Pt stated that she could call her neighbor for help if needed. Pt stated that she was not eating because her throat hurt her.  Pt began to get angry when family discussed going into a rehab setting at discharge.  Pt's family stepped outside of pt's room to discuss concerns with CSW.  Pt's son and daughter in law discussed pt living with her grown daughter and son in law in her home.  Family stated that pt's daughter  is not healthy, takes pills and sleeps 24 hours telling pt and family that her doctor wants her to stay in bed all day.  Pt's family stated that pt's son in law also has physical and mental health concerns and does not help support pt physically or financially although at one time he used to cook for pt.  Pt's family states that pt pays for all of the homes expenses, neither daughter or son in law contribute financially and then pt has no money to pay for any help in the home. Pt's family discussed past history of suicide for pt a couple of years ago where she took pills.  Pt's family believes that "she wants to die".  Pt's family believes she is depressed.      Patient/Family's Understanding of and Emotional Response to Diagnosis, Current Treatment, and Prognosis:  It is unclear if pt understands her illness as she did not want to discuss herself and stated she was tired. Pt appeared tired but her affect was more sad and angry at times.    Pt's family understands pt's illness and mental state but believe that "she's not going to go" to rehab.  Pt's family does not believe that they can help pt do something she does not want to do and pt's son stated that "I'm done if she does not get some help".   Emotional Assessment Appearance:  Appears stated age Attitude/Demeanor/Rapport:  Lethargic, Angry Affect (typically observed):  Agitated Orientation:  Oriented to Self, Oriented to Place, Oriented to  Time, Oriented to Situation Alcohol / Substance use:    Psych involvement (Current and /or in the community):  No (Comment)  Discharge Needs  Concerns to be addressed:  Denies Needs/Concerns at this time, Patient refuses services, Lack of Support, Discharge Planning Concerns, Home Safety Concerns, Mental Health Concerns Readmission within the last 30 days:    Current discharge risk:  Lack of support system, Dependent with Mobility Barriers to Discharge:  Unsafe home situation   Carlean Jews,  LCSW 02/07/2015, 8:28 AM

## 2015-02-08 DIAGNOSIS — E43 Unspecified severe protein-calorie malnutrition: Secondary | ICD-10-CM

## 2015-02-08 DIAGNOSIS — J984 Other disorders of lung: Secondary | ICD-10-CM

## 2015-02-08 DIAGNOSIS — B965 Pseudomonas (aeruginosa) (mallei) (pseudomallei) as the cause of diseases classified elsewhere: Secondary | ICD-10-CM

## 2015-02-08 DIAGNOSIS — K449 Diaphragmatic hernia without obstruction or gangrene: Secondary | ICD-10-CM

## 2015-02-08 DIAGNOSIS — R1312 Dysphagia, oropharyngeal phase: Secondary | ICD-10-CM

## 2015-02-08 DIAGNOSIS — J9811 Atelectasis: Secondary | ICD-10-CM

## 2015-02-08 DIAGNOSIS — R112 Nausea with vomiting, unspecified: Secondary | ICD-10-CM

## 2015-02-08 DIAGNOSIS — J90 Pleural effusion, not elsewhere classified: Secondary | ICD-10-CM

## 2015-02-08 LAB — PREALBUMIN: Prealbumin: 18.5 mg/dL (ref 18–38)

## 2015-02-08 MED ORDER — GUAIFENESIN 100 MG/5ML PO SOLN
400.0000 mg | Freq: Four times a day (QID) | ORAL | Status: DC
  Administered 2015-02-08 – 2015-02-09 (×5): 400 mg via ORAL
  Filled 2015-02-08: qty 20
  Filled 2015-02-08: qty 10
  Filled 2015-02-08 (×2): qty 20
  Filled 2015-02-08 (×3): qty 10
  Filled 2015-02-08 (×2): qty 20
  Filled 2015-02-08: qty 10

## 2015-02-08 MED ORDER — PANTOPRAZOLE SODIUM 40 MG PO TBEC
40.0000 mg | DELAYED_RELEASE_TABLET | Freq: Two times a day (BID) | ORAL | Status: DC
  Administered 2015-02-08 – 2015-02-09 (×2): 40 mg via ORAL
  Filled 2015-02-08 (×2): qty 1

## 2015-02-08 MED ORDER — ALPRAZOLAM 0.25 MG PO TABS
0.2500 mg | ORAL_TABLET | Freq: Three times a day (TID) | ORAL | Status: DC | PRN
  Administered 2015-02-08 – 2015-02-09 (×3): 0.25 mg via ORAL
  Filled 2015-02-08 (×3): qty 1

## 2015-02-08 MED ORDER — AMOXICILLIN-POT CLAVULANATE 400-57 MG/5ML PO SUSR
800.0000 mg | Freq: Two times a day (BID) | ORAL | Status: DC
  Administered 2015-02-08 – 2015-02-09 (×3): 800 mg via ORAL
  Filled 2015-02-08 (×6): qty 10

## 2015-02-08 NOTE — Progress Notes (Signed)
Physical Therapy Treatment Patient Details Name: AILED Key MRN: [id number] DOB: [birth year] Today's Date: 02-28-15    History of Present Illness 81 y.o. female admitted with Pna.  PMH of hypertension, GERD, anxiety, fibromyalgia, vertigo, Mnire's disease, fibroymyalgia.    PT Comments    Progressing slowly; cooperative with PT but reports feeling very tired today  Follow Up Recommendations  SNF;Supervision/Assistance - 24 hour     Equipment Recommendations  Rolling walker with 5" wheels    Recommendations for Other Services       Precautions / Restrictions Precautions Precautions: Fall    Mobility  Bed Mobility Overal bed mobility: Needs Assistance Bed Mobility: Supine to Sit;Sit to Supine     Supine to sit: Min assist Sit to supine: Min assist   General bed mobility comments: min assist with LEs, used rail, cues for self assist  Transfers Overall transfer level:  (pt declined)                  Ambulation/Gait                 Stairs            Wheelchair Mobility    Modified Rankin (Stroke Patients Only)       Balance                                    Cognition Arousal/Alertness: Awake/alert Behavior During Therapy: Flat affect Overall Cognitive Status: Within Functional Limits for tasks assessed                      Exercises General Exercises - Lower Extremity Ankle Circles/Pumps: AROM;Both;10 reps;Supine Quad Sets: AROM;Strengthening;Both;10 reps;Supine Heel Slides: AROM;AAROM;Strengthening;Both;10 reps;Supine Other Exercises Other Exercises: deep breathing in sitting x5 Other Exercises: flutter valve x 5 in supine    General Comments        Pertinent Vitals/Pain Pain Assessment: No/denies pain    Home Living                      Prior Function            PT Goals (current goals can now be found in the care plan section) Acute Rehab PT Goals Patient Stated Goal: none  stated PT Goal Formulation: With patient Time For Goal Achievement: 02/18/15 Potential to Achieve Goals: Good Progress towards PT goals: Progressing toward goals (slowly)    Frequency  Min 3X/week    PT Plan Current plan remains appropriate    Co-evaluation             End of Session   Activity Tolerance: Patient limited by fatigue Patient left: in bed;with call bell/phone within reach     Time: 1423-1435 PT Time Calculation (min) (ACUTE ONLY): 12 min  Charges:  $Therapeutic Exercise: 8-22 mins                    G Codes:      Nicole Key Feb 28, 2015, 2:38 PM

## 2015-02-08 NOTE — Progress Notes (Signed)
BRIEF PATIENT DESCRIPTION:  81 year old female w/ mult co-morbids hypertension, GERD, anxiety, fibromyalgia, and chronic cough X 2 yrs. Admitted 6/8 w/ working dx of CAP, hemoptysis, and pleuritic type CP. She underwent CT imaging to r/o malignancy given the hemoptysis. CT imaging questions LLL airway obstruction/ narrowing which was difficult to determine mucous plugging vs endobronchial lesions. Because of this PCCM was consulted.   STUDIES:  CT chest 6/9: Bilateral pleural effusions and bibasilar atelectasis versus airspace disease as described. There is obstruction of the small airways particularly in the left lower lobe. This may be due to mucous plugging, however endobronchial lesion is not excluded. Ground-glass opacities in the right upper lobe as described. Large hiatal hernia There is wall thickening of the esophagus. There is also fluid density within the mediastinum adjacent to the esophagus.  Mild mediastinal adenopathy.  resp culture 6/9: mod GNR, few GPC, rare yeast, rare WBC>>> BCX2 6/9>>> u strep 6/9: neg u legionella 6/9: >>>neg  Esophagram 6/10:  Large hiatal hernia with the entirety of the stomach above the diaphragm. No gastric outlet obstruction.  Subjective: No respiratory distress.  Surgery has seen her   Objective: Vital signs in last 24 hours: Blood pressure 144/73, pulse 78, temperature 98.1 F (36.7 C), temperature source Oral, resp. rate 18, height  (1.727 m), weight 55 kg (121 lb 4.1 oz), SpO2 95 %. Room air  Intake/Output from previous day: 06/12 0701 - 06/13 0700 In: 120 [P.O.:120] Out: 650 [Urine:650]   Physical Exam:   frail female in nad Nose without purulence or d/c noted. Neck without LN or TMG Chest with poor depth of inspiration, basilar crackles Cor with rrr abd soft with bs+.  NT/ND LE with mild edema Alert and oriented, moves all 4.    Lab Results:  Recent Labs  02/06/15 0507 02/07/15 0415  WBC 9.0 7.9  HGB 10.7*  10.7*  HCT 33.5* 32.5*  PLT 389 416*   BMET No results for input(s): NA, K, CL, CO2, GLUCOSE, BUN, CREATININE, CALCIUM in the last 72 hours.  Studies/Results: No results found.  Assessment/Plan: Mucous plugging Probable aspiration PNA The pt is having great difficulties with swallowing, and esophagram showed large hiatal hernia (most of stomach within the chest).  She has been seen by surgery. They feel that this will need surgical correction. There is no absolute pulmonary contra-indication to surgery. She is a high risk for surgery. Not so much from a pulmonary stand-point but more from her general frail state and malnutrition. That being said not sure that there is much else to offer as her current symptom burden from vomiting and reflux is very high (and to her not acceptable). Given her current state and the unlikelihood she will get better w/out surgery we would support going forward should the surgical team offer this intervention. She understands that she could die during the surgery also that she is at high risk for post-op complications and even death. I have also discussed this at length with her Kemper Durie (per her request).    plan -cover with abx for possible aspiration PNA.  Will change to augmentin elixer  -continue working on pulmonary toilet. -would recover her in either the SDU or ICU given her high risk surgical status. -we will s/o for now. We will be available post-op should she decide on surgery.  Simonne Martinet ACNP-BC [facility] Pulmonary/Critical Care Pager # [phone] OR # [phone] if no answer 02/08/2015, 8:58 AM  Attending Note:  I have  examined patient, reviewed labs, studies and notes. I have discussed the case with Kreg Shropshire, and I agree with the data and plans as amended above.   Levy Pupa, MD, PhD 02/08/2015, 1:11 PM [facility] [phone] or if no answer [phone]

## 2015-02-08 NOTE — Progress Notes (Signed)
OT Cancellation Note  Patient Details Name: Nicole Key MRN: [id number] DOB: Jun 12, 1928   Cancelled Treatment:    Reason Eval/Treat Not Completed: Other (comment) Pt currently eating lunch. Will try later time.  Lennox Laity  [phone] 02/08/2015, 12:47 PM

## 2015-02-08 NOTE — Consult Note (Signed)
[facility]     Reason for Consult:aspiration pneumonia and Pseudomonas culture in sputum    Referring Physician: Dr. Butler Denmark  Principal Problem:   Incarcerated hiatal hernia with obstruction Active Problems:   Meniere's disease   Unstable gait   Depression   CAP (community acquired pneumonia)   Sepsis   Anxiety   Essential hypertension   Fall   Generalized weakness   GERD (gastroesophageal reflux disease)   Protein-calorie malnutrition, severe   Acute respiratory failure with hypoxia   Aspiration pneumonia   . albuterol  2.5 mg Nebulization TID  . amLODipine  5 mg Oral Daily  . amoxicillin-clavulanate  800 mg Oral Q12H  . feeding supplement (ENSURE ENLIVE)  237 mL Oral TID BM  . guaiFENesin  400 mg Oral QID  . metoprolol tartrate  12.5 mg Oral BID  . pantoprazole  40 mg Oral BID AC    Recommendations: Continue with Augmentin for 1 day more to complete 5 days and stop.  Can use Unasyn if npo with the TPN.     Assessment: She has dysphagia and significant problems swallowing and CXR concerning for aspiration pneumonia vs pneumonitis.  She has a sputum culture with Pseudomonas but has been onCAP treatment and improved.  Her initial WBC was 38,000 but decreased to 14.3 the following day (suggesting it was not real) and now wnl, afebrile, asymptomatic.  Procalcitonin also not c/w bacterial infection.   Despite the Psuedomonas in the sputum, she is clinically better from a pulmonary standpoint so no indication to treat the culture, likely represents colonization.   Antibiotics: Ceftriaxone and azithromycin for 3 days Day 1 Augmentin  HPI: Nicole Key is a 81 y.o. female with chronic cough for 2 years, gradually worsening, some hemoptysis, SOB, subjective fever and chills at home who came in with fever of 100.9, increased WBC.  Significant dysphagia, CT with bilateral effusions and atelectasis and obstruction of lung.  Started on ceftriaxone  and azithromycin for presumed CAP.  Remained afebrile since ED, WBC down to normal and being evaluated for possible further surgical intervention.     Review of Systems: A comprehensive review of systems was negative.  Past Medical History  Diagnosis Date  . Insomnia   . Fibromyalgia   . Arthritis   . Meniere's disease 1989  . Hypertension   . Anxiety   . Vertigo   . GERD (gastroesophageal reflux disease)     History  Substance Use Topics  . Smoking status: Never Smoker   . Smokeless tobacco: Never Used  . Alcohol Use: No     Comment: denies alcohol use    Family History  Problem Relation Age of Onset  . Aortic aneurysm Mother     Had ruptured aorta  . Lung cancer Father   . Heart disease Father    Allergies  Allergen Reactions  . Shellfish Allergy     unknown    OBJECTIVE: Blood pressure 144/73, pulse 78, temperature 98.1 F (36.7 C), temperature source Oral, resp. rate 18, height 5\' 8"  (1.727 m), weight 121 lb 4.1 oz (55 kg), SpO2 95 %. General: awake, pleasant, nad Skin:  No rashes Lungs: CTA B Cor: RRR Abdomen: soft, nt Ext: no edema  Microbiology: Recent Results (from the past 240 hour(s))  Urine culture     Status: None   Collection Time: 02/03/15  7:28 PM  Result Value Ref Range Status   Specimen Description URINE, RANDOM  Final  Special Requests NONE  Final   Colony Count NO GROWTH Performed at [facility]   Final   Culture NO GROWTH Performed at [facility]   Final   Report Status 02/04/2015 FINAL  Final  Culture, blood (routine x 2)     Status: None (Preliminary result)   Collection Time: 02/04/15  4:04 AM  Result Value Ref Range Status   Specimen Description RIGHT ANTECUBITAL  Final   Special Requests BOTTLES DRAWN AEROBIC AND ANAEROBIC  Final   Culture   Final           BLOOD CULTURE RECEIVED NO GROWTH TO DATE CULTURE WILL BE HELD FOR 5 DAYS BEFORE ISSUING A FINAL NEGATIVE REPORT Performed at [facility]      Report Status PENDING  Incomplete  Culture, blood (routine x 2)     Status: None (Preliminary result)   Collection Time: 02/04/15  4:04 AM  Result Value Ref Range Status   Specimen Description BLOOD RIGHT HAND  Final   Special Requests BOTTLES DRAWN AEROBIC AND ANAEROBIC  Final   Culture   Final           BLOOD CULTURE RECEIVED NO GROWTH TO DATE CULTURE WILL BE HELD FOR 5 DAYS BEFORE ISSUING A FINAL NEGATIVE REPORT Performed at [facility]    Report Status PENDING  Incomplete  Respiratory virus panel     Status: None   Collection Time: 02/04/15  8:00 AM  Result Value Ref Range Status   Source - RVPAN NASOPHARYNGEAL SWAB  Corrected   Respiratory Syncytial Virus A Negative Negative Final   Respiratory Syncytial Virus B Negative Negative Final   Influenza A Negative Negative Final   Influenza B Negative Negative Final   Parainfluenza 1 Negative Negative Final   Parainfluenza 2 Negative Negative Final   Parainfluenza 3 Negative Negative Final   Metapneumovirus Negative Negative Final   Rhinovirus Negative Negative Final   Adenovirus Negative Negative Final    Comment: (NOTE) Performed At: [facility] [address] [place], [place] [id number] Mila Homer MD WU:[id number]   Culture, respiratory (NON-Expectorated)     Status: None   Collection Time: 02/04/15  8:20 AM  Result Value Ref Range Status   Specimen Description SPUTUM  Final   Special Requests NONE  Final   Gram Stain   Final    RARE WBC PRESENT,BOTH PMN AND MONONUCLEAR RARE SQUAMOUS EPITHELIAL CELLS PRESENT MODERATE GRAM NEGATIVE RODS FEW GRAM POSITIVE COCCI IN PAIRS IN CLUSTERS RARE YEAST    Culture   Final    MODERATE PSEUDOMONAS AERUGINOSA Performed at [facility]    Report Status 02/07/2015 FINAL  Final   Organism ID, Bacteria PSEUDOMONAS AERUGINOSA  Final      Susceptibility   Pseudomonas aeruginosa - MIC*    CEFEPIME 4 SENSITIVE Sensitive     CEFTAZIDIME 4 SENSITIVE  Sensitive     CIPROFLOXACIN <=0.25 SENSITIVE Sensitive     GENTAMICIN 2 SENSITIVE Sensitive     IMIPENEM 2 SENSITIVE Sensitive     PIP/TAZO 8 SENSITIVE Sensitive     TOBRAMYCIN <=1 SENSITIVE Sensitive     * MODERATE PSEUDOMONAS AERUGINOSA  Culture, sputum-assessment     Status: None   Collection Time: 02/04/15  8:30 AM  Result Value Ref Range Status   Specimen Description SPUTUM  Final   Special Requests NONE  Final   Sputum evaluation   Final    THIS SPECIMEN IS ACCEPTABLE. RESPIRATORY CULTURE REPORT  TO FOLLOW.   Report Status 02/04/2015 FINAL  Final    COMER, Molly Maduro, MD [facility] [email or web address] [phone] pager  [phone] cell 02/08/2015, 2:55 PM

## 2015-02-08 NOTE — Progress Notes (Signed)
Subjective: She looks and feels pretty bad.  She hurts all the time to eat or drink, she is coughing up thick sputum.  She isn't really sure about weight loss, she cannot tell us one way or the other.  She is on a regular diet, but reports vomiting with PO, and ongoing pain upper neck and chest with PO intake.  Objective: Vital signs in last 24 hours: Temp:  [98.1 F (36.7 C)-98.9 F (37.2 C)] 98.1 F (36.7 C) (06/13 0528) Pulse Rate:  [78-92] 78 (06/13 0833) Resp:  [18-20] 18 (06/13 0528) BP: (116-144)/(56-73) 144/73 mmHg (06/13 0833) SpO2:  [93 %-96 %] 95 % (06/13 0528) Last BM Date: 02/03/15 120 PO recorded Diet: regular Afebrile, VSS CBC is stable Prealbumin is pending No growth urine culture Sputum 4 days ago shows:  MODERATE PSEUDOMONAS AERUGINOSA CXR 02/06/15  Intake/Output from previous day: 06/12 0701 - 06/13 0700 In: 120 [P.O.:120] Out: 650 [Urine:650] Intake/Output this shift: Total I/O In: -  Out: 500 [Urine:500]  General appearance: alert and frail, complaining of neck pain with any swallowing, ecchymosis over left eye. Resp: clear to auscultation bilaterally and BS down in the bases GI: soft, non-tender; bowel sounds normal; no masses,  no organomegaly  Lab Results:   Recent Labs  02/06/15 0507 02/07/15 0415  WBC 9.0 7.9  HGB 10.7* 10.7*  HCT 33.5* 32.5*  PLT 389 416*    BMET No results for input(s): NA, K, CL, CO2, GLUCOSE, BUN, CREATININE, CALCIUM in the last 72 hours. PT/INR No results for input(s): LABPROT, INR in the last 72 hours.   Recent Labs Lab 02/03/15 1859  AST 25  ALT 11*  ALKPHOS 100  BILITOT 0.8  PROT 7.2  ALBUMIN 3.4*     Lipase     Component Value Date/Time   LIPASE 10* 02/04/2015 0036     Studies/Results: No results found.  Medications: . albuterol  2.5 mg Nebulization TID  . amLODipine  5 mg Oral Daily  . amoxicillin-clavulanate  800 mg Oral Q12H  . feeding supplement (ENSURE ENLIVE)  237 mL Oral TID BM   . guaiFENesin  400 mg Oral QID  . metoprolol tartrate  12.5 mg Oral BID  . pantoprazole  40 mg Oral Daily    Assessment/Plan Large hiatal hernia, entire stomach is in the chest  Odynophagia, dysphagia Aspiration pneumonia/LLL Bilateral pleural effusions Hypertension GERD  Meniere's disease with falls/Vertigo Fibromyalgia Anxiety Antibiotics: Day 6, Currently on Augmentin Anemia - Hgb - 10.7 - 02/08/2015 Severely malnourished  Actually prealbumin is better than expected - 18.5 - 02/08/2015 DVT: None currently  IMP:  Dr. Butler Denmark is getting prealbumin, and ID to see.  CCM has seen and agree she will most likely require surgery to fix this.  She is at high risk but surgery is not contraindicated from their standpoint.  We are seeing her for the first time.  At this point I think we need to get the prealbumin, a PICC line and start TNA .  PO intake is the issue, since everything hurts to go down and comes back up.  I would give her some time to get over the pneumonia, continue ongoing OT/PT evaluation and treatment.  Dr. Ezzard Standing will see and review later today. I will order PICC and TNA.  I have added CMP for pharmacy to be drawn with PICC placement.    LOS: 4 days    JENNINGS,WILLARD 02/08/2015  Agree with above. This is a difficult situation.  She is very  deconditioned and probably malnourished, in spite of her "normal" prealbumin. She can not (and has not) ambulate on her own at all.  And this has been going on for several months. On top of this, she is just getting over pneumonia.  At this point, I think major abdominal surgery would present a significant risk to her.  How much her condition could be improved, I'm not sure.  She is widowed.  She lives with her daughter, Doloris Hall (who has her medical power of attorney), but the daughter has her own medical problems after heart surgery and can't get out of the house to visit her mother.  At bedside are Jasper Loser (her  grandson) and a friend, Coolidge Breeze. She also has a son, Tresa Scogin, who is not present, but visiting family in [place].  Her PCP is Dr. Quillian Quince.  I will call him tomorrow to get his input, but he has not seen her since fall 2015 according to the patient.  Ovidio Kin, MD, [facility] Pager: [phone] Office phone:  [phone]

## 2015-02-08 NOTE — Progress Notes (Signed)
[facility] PROGRESS NOTE  Nicole Key BXU:[id number] DOB: [birth year] DOA: 02/03/2015 PCP: [name], MD  Assessment/Plan: CAP vs Aspiration PNA and sepsis;  - Much improved on IV Rocephin and azithromycin- transitioned to  PO azithromycin and ceftin and then Augmentin elixer yesterday - sputum culture reveals pseudomonas, have asked ID for opinion on how to treat - Mucinex for cough  - Xopenex Neb prn for SOB - Urine legionella NEG - Urine S. pneumococcal antigen NEG - Blood culture x2 neg thus far - Respiratory virus panel neg - CT chest with contrast with B pleural effusions and atelectasis vs airspace disease - Obstruction noted in the small airways mainly in the LLL which may be due to mucus plugging. - Consulted Pulmonary who agrees with mucus plugging on CT. Possible aspiration PNA - Appreciate SLP input. Pt underwent esophogram with severe hiatal hernia noted. No obstruction - Clinically much improved with resolved leukocytosis.  Issues with vomiting/ pain on swallowing - severe reflux esophagitis? Change Protonix to BID - vomiting suspected to be due to large hiatal hernia- surgery recommends surgical correction after discharge- patient is considering this - she feels she has no other options  Fall:  - pt/ot consulted - pt declines SNF, wants to go home - Cont on abx for CAP vs asp PNA and sepsis above. - Stable  Anxiety:  - Overall stable, no suicidal or homicidal ideations. -Continue home medications: Xanax PRN - Pt reports bouts of anxiety attacks  GERD: -Protonix  HTN:  -Amlodipine -Stable  DVT ppx: SCDs  Code Status: DNR Family Communication: Pt in room Disposition Plan: Pending   Consultants:    Procedures:    Antibiotics: Anti-infectives    Start     Dose/Rate Route Frequency Ordered Stop   02/08/15 1000  amoxicillin-clavulanate (AUGMENTIN) 400-57 MG/5ML suspension 800 mg     800 mg Oral Every 12 hours 02/08/15 0913      02/07/15 1600  amoxicillin-clavulanate (AUGMENTIN) 875-125 MG per tablet 1 tablet  Status:  Discontinued     1 tablet Oral Every 12 hours 02/07/15 1346 02/08/15 0913   02/07/15 1000  azithromycin (ZITHROMAX) tablet 500 mg  Status:  Discontinued     500 mg Oral Daily 02/06/15 1054 02/07/15 1343   02/06/15 1700  cefUROXime (CEFTIN) tablet 250 mg  Status:  Discontinued     250 mg Oral 2 times daily with meals 02/06/15 1054 02/07/15 1343   02/04/15 0530  cefTRIAXone (ROCEPHIN) 1 g in dextrose 5 % 50 mL IVPB  Status:  Discontinued     1 g 100 mL/hr over 30 Minutes Intravenous Every 24 hours 02/04/15 0523 02/04/15 0639   02/04/15 0530  cefTRIAXone (ROCEPHIN) 1 g in dextrose 5 % 50 mL IVPB  Status:  Discontinued     1 g 100 mL/hr over 30 Minutes Intravenous Every 24 hours 02/04/15 0523 02/06/15 1054   02/04/15 0530  azithromycin (ZITHROMAX) 500 mg in dextrose 5 % 250 mL IVPB  Status:  Discontinued     500 mg 250 mL/hr over 60 Minutes Intravenous Every 24 hours 02/04/15 0523 02/06/15 1054   02/04/15 0345  cefTRIAXone (ROCEPHIN) 1 g in dextrose 5 % 50 mL IVPB     1 g 100 mL/hr over 30 Minutes Intravenous  Once 02/04/15 0335 02/04/15 0434   02/04/15 0345  azithromycin (ZITHROMAX) 500 mg in dextrose 5 % 250 mL IVPB     500 mg 250 mL/hr over 60 Minutes Intravenous  Once 02/04/15 0335 02/04/15  1900       HPI/Subjective: C/o improvement in cough- continues to have severe pain with drinking and eating-   Objective: Filed Vitals:   02/07/15 2007 02/07/15 2107 02/08/15 0528 02/08/15 0833  BP:  123/63 126/59 144/73  Pulse:  92 80 78  Temp:  98.9 F (37.2 C) 98.1 F (36.7 C)   TempSrc:  Oral Oral   Resp:  18 18   Height:      Weight:      SpO2: 96% 93% 95%     Intake/Output Summary (Last 24 hours) at 02/08/15 1020 Last data filed at 02/08/15 0802  Gross per 24 hour  Intake     60 ml  Output   1150 ml  Net  -1090 ml   Filed Weights   02/03/15 1828 02/04/15 0648  Weight: 45.36 kg (100  lb) 55 kg (121 lb 4.1 oz)    Exam:   General:  Awake, laying in bed, in nad  Cardiovascular: regular, s1, s2  Respiratory: normal resp effort,  in place  Abdomen: soft, nondistended, pos BS, nontender  Musculoskeletal: perfused, no clubbing, no cyanosis  Data Reviewed: Basic Metabolic Panel:  Recent Labs Lab 02/03/15 1859  NA 140  K 3.6  CL 98*  CO2 29  GLUCOSE 144*  BUN 15  CREATININE 0.64  CALCIUM 9.0   Liver Function Tests:  Recent Labs Lab 02/03/15 1859  AST 25  ALT 11*  ALKPHOS 100  BILITOT 0.8  PROT 7.2  ALBUMIN 3.4*    Recent Labs Lab 02/04/15 0036  LIPASE 10*   No results for input(s): AMMONIA in the last 168 hours. CBC:  Recent Labs Lab 02/03/15 1859 02/05/15 0755 02/06/15 0507 02/07/15 0415  WBC 38.0* 14.3* 9.0 7.9  NEUTROABS 33.1*  --   --   --   HGB 14.2 11.2* 10.7* 10.7*  HCT 43.3 34.3* 33.5* 32.5*  MCV 94.1 96.1 94.9 93.7  PLT 476* 377 389 416*   Cardiac Enzymes:  Recent Labs Lab 02/03/15 1916  TROPONINI <0.03   BNP (last 3 results) No results for input(s): BNP in the last 8760 hours.  ProBNP (last 3 results) No results for input(s): PROBNP in the last 8760 hours.  CBG: No results for input(s): GLUCAP in the last 168 hours.  Recent Results (from the past 240 hour(s))  Urine culture     Status: None   Collection Time: 02/03/15  7:28 PM  Result Value Ref Range Status   Specimen Description URINE, RANDOM  Final   Special Requests NONE  Final   Colony Count NO GROWTH Performed at [facility]   Final   Culture NO GROWTH Performed at [facility]   Final   Report Status 02/04/2015 FINAL  Final  Culture, blood (routine x 2)     Status: None (Preliminary result)   Collection Time: 02/04/15  4:04 AM  Result Value Ref Range Status   Specimen Description RIGHT ANTECUBITAL  Final   Special Requests BOTTLES DRAWN AEROBIC AND ANAEROBIC  Final   Culture   Final           BLOOD CULTURE RECEIVED NO  GROWTH TO DATE CULTURE WILL BE HELD FOR 5 DAYS BEFORE ISSUING A FINAL NEGATIVE REPORT Performed at [facility]    Report Status PENDING  Incomplete  Culture, blood (routine x 2)     Status: None (Preliminary result)   Collection Time: 02/04/15  4:04 AM  Result Value Ref Range Status  Specimen Description BLOOD RIGHT HAND  Final   Special Requests BOTTLES DRAWN AEROBIC AND ANAEROBIC  Final   Culture   Final           BLOOD CULTURE RECEIVED NO GROWTH TO DATE CULTURE WILL BE HELD FOR 5 DAYS BEFORE ISSUING A FINAL NEGATIVE REPORT Performed at [facility]    Report Status PENDING  Incomplete  Respiratory virus panel     Status: None   Collection Time: 02/04/15  8:00 AM  Result Value Ref Range Status   Source - RVPAN NASOPHARYNGEAL SWAB  Corrected   Respiratory Syncytial Virus A Negative Negative Final   Respiratory Syncytial Virus B Negative Negative Final   Influenza A Negative Negative Final   Influenza B Negative Negative Final   Parainfluenza 1 Negative Negative Final   Parainfluenza 2 Negative Negative Final   Parainfluenza 3 Negative Negative Final   Metapneumovirus Negative Negative Final   Rhinovirus Negative Negative Final   Adenovirus Negative Negative Final    Comment: (NOTE) Performed At: [facility] [address] [place], [place] [id number] Mila Homer MD WU:[id number]   Culture, respiratory (NON-Expectorated)     Status: None   Collection Time: 02/04/15  8:20 AM  Result Value Ref Range Status   Specimen Description SPUTUM  Final   Special Requests NONE  Final   Gram Stain   Final    RARE WBC PRESENT,BOTH PMN AND MONONUCLEAR RARE SQUAMOUS EPITHELIAL CELLS PRESENT MODERATE GRAM NEGATIVE RODS FEW GRAM POSITIVE COCCI IN PAIRS IN CLUSTERS RARE YEAST    Culture   Final    MODERATE PSEUDOMONAS AERUGINOSA Performed at [facility]    Report Status 02/07/2015 FINAL  Final   Organism ID, Bacteria PSEUDOMONAS AERUGINOSA  Final       Susceptibility   Pseudomonas aeruginosa - MIC*    CEFEPIME 4 SENSITIVE Sensitive     CEFTAZIDIME 4 SENSITIVE Sensitive     CIPROFLOXACIN <=0.25 SENSITIVE Sensitive     GENTAMICIN 2 SENSITIVE Sensitive     IMIPENEM 2 SENSITIVE Sensitive     PIP/TAZO 8 SENSITIVE Sensitive     TOBRAMYCIN <=1 SENSITIVE Sensitive     * MODERATE PSEUDOMONAS AERUGINOSA  Culture, sputum-assessment     Status: None   Collection Time: 02/04/15  8:30 AM  Result Value Ref Range Status   Specimen Description SPUTUM  Final   Special Requests NONE  Final   Sputum evaluation   Final    THIS SPECIMEN IS ACCEPTABLE. RESPIRATORY CULTURE REPORT TO FOLLOW.   Report Status 02/04/2015 FINAL  Final     Studies: No results found.  Scheduled Meds: . albuterol  2.5 mg Nebulization TID  . amLODipine  5 mg Oral Daily  . amoxicillin-clavulanate  800 mg Oral Q12H  . feeding supplement (ENSURE ENLIVE)  237 mL Oral TID BM  . guaiFENesin  400 mg Oral QID  . metoprolol tartrate  12.5 mg Oral BID  . pantoprazole  40 mg Oral Daily   Continuous Infusions:      [facility]  [facility] Pager [phone] 7 PM-7AM, please contact night-coverage at [email or web address], password [facility] 02/08/2015, 10:20 AM  LOS: 4 days

## 2015-02-08 NOTE — Progress Notes (Addendum)
PARENTERAL NUTRITION CONSULT NOTE - INITIAL  Pharmacy Consult for TPN Indication: dysphagia and N/V  Allergies  Allergen Reactions  . Shellfish Allergy     unknown    Patient Measurements: Height: 5\' 8"  (172.7 cm) Weight: 121 lb 4.1 oz (55 kg) IBW/kg (Calculated) : 63.9 Adjusted Body Weight:  Usual Weight:   Vital Signs: Temp: 98.1 F (36.7 C) (06/13 0528) Temp Source: Oral (06/13 0528) BP: 144/73 mmHg (06/13 0833) Pulse Rate: 78 (06/13 0833) Intake/Output from previous day: 06/12 0701 - 06/13 0700 In: 120 [P.O.:120] Out: 650 [Urine:650] Intake/Output from this shift: Total I/O In: 240 [P.O.:240] Out: 500 [Urine:500]  Labs:  Recent Labs  02/06/15 0507 02/07/15 0415  WBC 9.0 7.9  HGB 10.7* 10.7*  HCT 33.5* 32.5*  PLT 389 416*    No results for input(s): NA, K, CL, CO2, GLUCOSE, BUN, CREATININE, LABCREA, CREAT24HRUR, CALCIUM, MG, PHOS, PROT, ALBUMIN, AST, ALT, ALKPHOS, BILITOT, BILIDIR, IBILI, PREALBUMIN, TRIG, CHOLHDL, CHOL in the last 72 hours. Estimated Creatinine Clearance: 45.5 mL/min (by C-G formula based on Cr of 0.64).   No results for input(s): GLUCAP in the last 72 hours.  Medical History: Past Medical History  Diagnosis Date  . Insomnia   . Fibromyalgia   . Arthritis   . Meniere's disease 1989  . Hypertension   . Anxiety   . Vertigo   . GERD (gastroesophageal reflux disease)    Insulin Requirements:  No current insulin orders  Current Nutrition:  Regular  IVF:  None  Central access: Orders to place PICC  TPN start date: Anticipate 6/14 d/t no line access and no labs (to be drawn after PICC placement)  ASSESSMENT                                                                                                          HPI: 26 YOF presents with cough, shortness of breath and chest pain on 6/9. She has c/o dysphagia, seen by [facility] for giant hiatal hernia. Per surgery, hernia may be causing obstruction causing N/V and possible aspiration.  Patient states her weight has fluctuated.  Plan is surgical repair of hernia during this admission  Significant events:   Today:    Glucose -   Electrolytes -  Renal -  LFTs -  TGs -  Prealbumin - pending (6/13)  NUTRITIONAL GOALS                                                                                             RD recs: TBD - consulted dietician Estimated goals: 1375-1650 Kcals, 77- 90gm protein Clinimix 5/15 at a goal rate of 28ml/hr + 20% fat emulsion at 45ml/hr to provide: 84g/day  protein, 1672Kcal/day. See updated goal per RD and adjusted TPN goals in Addendum below  PLAN                                                                                                                         At 1800 today:  Plan to start TPN 6/14 based on needing PICC and labs prior to being able to order and mix TPN.    Add q6h sensitive SSI with start of TPN .   TPN lab panels on Mondays & Thursdays.  F/u daily.   Nicole Key, PharmD, BCPS.   Pager: [phone]  02/08/2015,1:23 PM  Addendum:  RD recommendations: 50-60gm protein, 1250-1450kcals Clinimix 5/15 at a goal rate of 64ml/hr + 20% fat emulsion at 59ml/hr to provide: 66g/day protein, 1273Kcal/day.   Nicole Key, PharmD, BCPS.   Pager: [phone] 02/08/2015 2:39 PM

## 2015-02-08 NOTE — Progress Notes (Signed)
Nutrition Follow-up  DOCUMENTATION CODES:  Severe malnutrition in context of acute illness/injury, Underweight  INTERVENTION: - TPN per pharmacy - RD will continue to monitor for needs  NUTRITION DIAGNOSIS:  Inadequate oral intake related to nausea, vomiting as evidenced by per patient/family report. -ongoing  GOAL:  Patient will meet greater than or equal to 90% of their needs -unmet  MONITOR:  Other (Comment), Weight trends, Labs, I & O's (TPN initiation and advancement)  REASON FOR ASSESSMENT:  Consult New TPN/TNA  ASSESSMENT: ASSESSMENT: 81 y.o. female with PMH of hypertension, GERD, anxiety, fibromyalgia, vertigo, Mnire's disease, insomnia, who presents with cough, chest pain and shortness of breath, fall. Patient reports that she has chronic cough in the past 2 years, which has been getting worse recently. She coughs up streaks amount of blood and brown colored sputum.  Per rounds this AM, pt with large hiatal hernia and has been complaining of esophageal pain and difficulty swallowing. She has agreed to surgical intervention but RN unsure, at that time, when this may occur.  Pt has been eating 5-50% since last assessment. Consult received for new TPN. No PICC or central line at this time. Pharmacy states goal TPN regimen: Clinimix 5/15 @ 70 mL/hr with 20% lipids @ 10 mL/hr which will provide 84 grams protein, 1673 kcal.  Per pharmacy: At 1800 today:  Plan to start TPN 6/14 based on needing PICC and labs prior to being able to order and mix TPN.  Add q6h sensitive SSI with start of TPN .   Not meeting needs. Medications reviewed. Labs reviewed with no lytes or CBGs available for review.  Height:  Ht Readings from Last 1 Encounters:  02/04/15 5\' 8"  (1.727 m)    Weight:  Wt Readings from Last 1 Encounters:  02/04/15 121 lb 4.1 oz (55 kg)    Ideal Body Weight:  63.6 kg (kg)  Wt Readings from Last 10 Encounters:  02/04/15 121 lb 4.1 oz (55 kg)   08/15/13 117 lb (53.071 kg)  07/18/11 120 lb (54.432 kg)    BMI:  Body mass index is 18.44 kg/(m^2).  Estimated Nutritional Needs:  Kcal:  1250-1450  Protein:  50-60 grams  Fluid:  2.5-3 L/day  Skin:  Reviewed, no issues  Diet Order:  Diet regular Room service appropriate?: Yes; Fluid consistency:: Thin  EDUCATION NEEDS:  No education needs identified at this time   Intake/Output Summary (Last 24 hours) at 02/08/15 1422 Last data filed at 02/08/15 1024  Gross per 24 hour  Intake    300 ml  Output    950 ml  Net   -650 ml    Last BM:  No documentation since admission   Trenton Gammon, RD, LDN Inpatient Clinical Dietitian Pager # [phone] After hours/weekend pager # [phone]

## 2015-02-09 DIAGNOSIS — K440 Diaphragmatic hernia with obstruction, without gangrene: Secondary | ICD-10-CM

## 2015-02-09 LAB — DIFFERENTIAL
BASOS ABS: 0 10*3/uL (ref 0.0–0.1)
BASOS PCT: 0 % (ref 0–1)
Eosinophils Absolute: 0.2 10*3/uL (ref 0.0–0.7)
Eosinophils Relative: 2 % (ref 0–5)
Lymphocytes Relative: 23 % (ref 12–46)
Lymphs Abs: 2.2 10*3/uL (ref 0.7–4.0)
Monocytes Absolute: 1 10*3/uL (ref 0.1–1.0)
Monocytes Relative: 10 % (ref 3–12)
NEUTROS ABS: 6.4 10*3/uL (ref 1.7–7.7)
Neutrophils Relative %: 65 % (ref 43–77)

## 2015-02-09 LAB — COMPREHENSIVE METABOLIC PANEL
ALT: 12 U/L — ABNORMAL LOW (ref 14–54)
ANION GAP: 6 (ref 5–15)
AST: 13 U/L — ABNORMAL LOW (ref 15–41)
Albumin: 2.5 g/dL — ABNORMAL LOW (ref 3.5–5.0)
Alkaline Phosphatase: 73 U/L (ref 38–126)
BUN: 16 mg/dL (ref 6–20)
CO2: 30 mmol/L (ref 22–32)
CREATININE: 0.59 mg/dL (ref 0.44–1.00)
Calcium: 8.2 mg/dL — ABNORMAL LOW (ref 8.9–10.3)
Chloride: 105 mmol/L (ref 101–111)
GFR calc non Af Amer: >60 mL/min (ref >60)
Glucose, Bld: 112 mg/dL — ABNORMAL HIGH (ref 65–99)
Potassium: 3.3 mmol/L — ABNORMAL LOW (ref 3.5–5.1)
Sodium: 141 mmol/L (ref 135–145)
Total Bilirubin: 0.3 mg/dL (ref 0.3–1.2)
Total Protein: 5.6 g/dL — ABNORMAL LOW (ref 6.5–8.1)

## 2015-02-09 LAB — PHOSPHORUS: PHOSPHORUS: 3.2 mg/dL (ref 2.5–4.6)

## 2015-02-09 LAB — CBC
HCT: 33.7 % — ABNORMAL LOW (ref 36.0–46.0)
Hemoglobin: 10.9 g/dL — ABNORMAL LOW (ref 12.0–15.0)
MCH: 30.8 pg (ref 26.0–34.0)
MCHC: 32.3 g/dL (ref 30.0–36.0)
MCV: 95.2 fL (ref 78.0–100.0)
PLATELETS: 476 10*3/uL — AB (ref 150–400)
RBC: 3.54 MIL/uL — AB (ref 3.87–5.11)
RDW: 15.4 % (ref 11.5–15.5)
WBC: 9.7 10*3/uL (ref 4.0–10.5)

## 2015-02-09 LAB — TRIGLYCERIDES: Triglycerides: 173 mg/dL — ABNORMAL HIGH (ref <150)

## 2015-02-09 LAB — MAGNESIUM: MAGNESIUM: 2.2 mg/dL (ref 1.7–2.4)

## 2015-02-09 MED ORDER — POTASSIUM CHLORIDE 20 MEQ/15ML (10%) PO SOLN
40.0000 meq | Freq: Once | ORAL | Status: AC
  Administered 2015-02-09: 40 meq via ORAL
  Filled 2015-02-09: qty 30

## 2015-02-09 MED ORDER — METOPROLOL TARTRATE 25 MG PO TABS: 12.5000 mg | ORAL_TABLET | Freq: Two times a day (BID) | ORAL | Status: DC

## 2015-02-09 MED ORDER — TRAMADOL HCL 50 MG PO TABS: 50.0000 mg | ORAL_TABLET | Freq: Four times a day (QID) | ORAL | Status: DC | PRN

## 2015-02-09 MED ORDER — ENSURE ENLIVE PO LIQD: 237.0000 mL | Freq: Three times a day (TID) | ORAL | Status: DC

## 2015-02-09 MED ORDER — BENZONATATE 100 MG PO CAPS: 100.0000 mg | ORAL_CAPSULE | Freq: Three times a day (TID) | ORAL | Status: DC | PRN

## 2015-02-09 MED ORDER — FAT EMULSION 20 % IV EMUL
168.0000 mL | INTRAVENOUS | Status: DC
  Filled 2015-02-09: qty 250

## 2015-02-09 MED ORDER — SODIUM CHLORIDE 0.9 % IJ SOLN: 10.0000 mL | INTRAMUSCULAR | Status: DC | PRN

## 2015-02-09 MED ORDER — INSULIN ASPART 100 UNIT/ML ~~LOC~~ SOLN: 0.0000 [IU] | Freq: Four times a day (QID) | SUBCUTANEOUS | Status: DC

## 2015-02-09 MED ORDER — HYDROCODONE-ACETAMINOPHEN 5-325 MG PO TABS: 1.0000 | ORAL_TABLET | Freq: Four times a day (QID) | ORAL | Status: DC | PRN

## 2015-02-09 MED ORDER — PANTOPRAZOLE SODIUM 40 MG PO TBEC: 40.0000 mg | DELAYED_RELEASE_TABLET | Freq: Two times a day (BID) | ORAL | Status: DC

## 2015-02-09 MED ORDER — TRACE MINERALS CR-CU-MN-SE-ZN 10-1000-500-60 MCG/ML IV SOLN
INTRAVENOUS | Status: DC
  Filled 2015-02-09: qty 720

## 2015-02-09 MED ORDER — TRAZODONE HCL 50 MG PO TABS: 50.0000 mg | ORAL_TABLET | Freq: Every evening | ORAL | Status: DC | PRN

## 2015-02-09 MED ORDER — MAGIC MOUTHWASH W/LIDOCAINE: 5.0000 mL | Freq: Three times a day (TID) | ORAL | Status: DC

## 2015-02-09 NOTE — Discharge Summary (Signed)
Physician Discharge Summary  Nicole Key WHQ:[id number] DOB: 04-20-1928 DOA: 02/03/2015  PCP: [name], MD  Admit date: 02/03/2015 Discharge date: 02/09/2015  Time spent: 20 minutes  Recommendations for Outpatient Follow-up:  1. Follow up with PCP in 1-2 weeks 2. Follow up with Dr. Ezzard Standing as scheduled 3. Please keep head of bed elevated at least 30 degrees at all times to reduce risk of aspiration  Discharge Diagnoses:  Principal Problem:   Incarcerated hiatal hernia with obstruction Active Problems:   Meniere's disease   Unstable gait   Depression   CAP (community acquired pneumonia)   Sepsis   Anxiety   Essential hypertension   Fall   Generalized weakness   GERD (gastroesophageal reflux disease)   Protein-calorie malnutrition, severe   Acute respiratory failure with hypoxia   Aspiration pneumonia   Discharge Condition: Improved  Diet recommendation: Regular with thin liquids  Filed Weights   02/03/15 1828 02/04/15 0648  Weight: 45.36 kg (100 lb) 55 kg (121 lb 4.1 oz)    History of present illness:  Please see admit h and  p from 6/9 for details. Briefly, pt presented with chest pains, sob, found to have PNA. Pt was admitted for further work up.  Hospital Course:  CAP vs Aspiration PNA and sepsis;  - Much improved on IV Rocephin and azithromycin, later transitioned to PO azithromycin and ceftin and then Augmentin elixer. Patient has since completed course of antibiotics as of 6/13 - sputum culture revealed pseudomonas, ID was consulted who agreed with augmentin per above - Mucinex for cough  - Xopenex Neb prn for SOB - Urine legionella NEG - Urine S. pneumococcal antigen NEG - Blood culture x2 neg - Respiratory virus panel neg - CT chest with contrast with B pleural effusions and atelectasis vs airspace disease - Obstruction noted in the small airways mainly in the LLL which may be due to mucus plugging. - Consulted Pulmonary who agrees with mucus  plugging on CT. Possible aspiration PNA - Appreciate SLP input. Pt underwent esophogram with severe hiatal hernia noted. No obstruction - Clinically much improved with resolved leukocytosis.  Issues with vomiting/ pain on swallowing - severe reflux esophagitis? Changed Protonix to BID - vomiting suspected to be due to large hiatal hernia - surgery recommended surgical correction after discharge and after patient's strength improves - Surgery initially recommended TPN however pre-albumin noted to be 18.5, thus TPN orders since d/c'd per General Surgery  Fall:  - pt/ot consulted - pt  Initially declined SNF however after discussing with her PCP, pt now agreeable for SNF  Anxiety:  - Overall stable, no suicidal or homicidal ideations. -Continue home medications: Xanax PRN - Pt reports bouts of anxiety attacks  GERD: -Protonix  HTN:  -Amlodipine -Stable  DVT ppx: SCDs   Consultations:  Pulmonary  General Surgery  ID  Discharge Exam: Filed Vitals:   02/08/15 2024 02/08/15 2038 02/09/15 0524 02/09/15 0938  BP:  133/63 133/54   Pulse:  91 88   Temp:  98.4 F (36.9 C) 98.5 F (36.9 C)   TempSrc:  Oral Oral   Resp:  18 18   Height:      Weight:      SpO2: 95% 95% 93% 97%    General: Awake, in nad Cardiovascular: regular, s1, s2 Respiratory: normal resp effort, no wheezing  Discharge Instructions     Medication List    TAKE these medications        ALPRAZolam 0.25 MG tablet  Commonly known as:  XANAX  Take 0.25 mg by mouth 2 (two) times daily as needed (anxiety).     amLODipine 5 MG tablet  Commonly known as:  NORVASC  Take 1 tablet (5 mg total) by mouth daily.     benzonatate 100 MG capsule  Commonly known as:  TESSALON  Take 1 capsule (100 mg total) by mouth 3 (three) times daily as needed for cough.     ENSURE ACTIVE HIGH PROTEIN Liqd  Take 1 Can by mouth 3 (three) times daily.     feeding supplement (ENSURE ENLIVE) Liqd  Take 237 mLs by mouth  3 (three) times daily between meals.     HYDROcodone-acetaminophen 5-325 MG per tablet  Commonly known as:  NORCO/VICODIN  Take 1 tablet by mouth every 6 (six) hours as needed for moderate pain.     HYDROcodone-acetaminophen 5-325 MG per tablet  Commonly known as:  NORCO/VICODIN  Take 1 tablet by mouth every 6 (six) hours as needed for moderate pain.     magic mouthwash w/lidocaine Soln  Take 5 mLs by mouth 4 (four) times daily -  with meals and at bedtime.     metoprolol tartrate 25 MG tablet  Commonly known as:  LOPRESSOR  Take 0.5 tablets (12.5 mg total) by mouth 2 (two) times daily.     omeprazole 20 MG capsule  Commonly known as:  PRILOSEC  Take 20 mg by mouth daily.     promethazine 25 MG tablet  Commonly known as:  PHENERGAN  Take 25 mg by mouth every 8 (eight) hours as needed for nausea or vomiting.     traMADol 50 MG tablet  Commonly known as:  ULTRAM  Take 1 tablet (50 mg total) by mouth every 6 (six) hours as needed (pain). Maximum dose= 8 tablets per day as needed for pain     traZODone 50 MG tablet  Commonly known as:  DESYREL  Take 1 tablet (50 mg total) by mouth at bedtime as needed for sleep.       Allergies  Allergen Reactions  . Shellfish Allergy     unknown   Follow-up Information    Follow up with NEWMAN,DAVID H, MD. Schedule an appointment as soon as possible for a visit in 3 weeks.   Specialty:  General Surgery   Contact information:   [address] [place] [address] [phone]       Follow up with ARONSON,RICHARD A, MD. Schedule an appointment as soon as possible for a visit in 1 week.   Specialty:  Internal Medicine   Why:  Hospital follow up   Contact information:   [address] [place] [address] [phone]        The results of significant diagnostics from this hospitalization (including imaging, microbiology, ancillary and laboratory) are listed below for reference.    Significant Diagnostic  Studies: Dg Chest 2 View  02/06/2015   CLINICAL DATA:  Acute respiratory failure with hypoxia. Shortness of breath.  EXAM: CHEST  2 VIEW  COMPARISON:  Chest CT 02/04/2015.  Chest radiograph 02/03/2015.  FINDINGS: Cardiac silhouette is within normal limits for size. A moderately large hiatal hernia is again seen with a small amount of residual contrast projecting within. There are small bilateral pleural effusions, left larger than right and mildly increased in size compared to the prior radiographs. Left greater than right basilar airspace opacities have also increased from the prior radiographs although may not knee significantly changed  from the more recent CT. Interstitial markings remain mildly prominent diffusely. No pneumothorax is identified. No acute osseous abnormality is seen.  IMPRESSION: Small bilateral pleural effusions and left greater than right basilar lung opacities, mildly increased from the prior radiographs and may reflect a combination of atelectasis and left lower lobe pneumonia.   Electronically Signed   By: Sebastian Ache   On: 02/06/2015 08:52   Dg Chest 2 View  02/03/2015   CLINICAL DATA:  81 year old female with history of trauma from a fall 1 week ago. Poor appetite, weakness and lethargy since that time. Emesis.  EXAM: CHEST  2 VIEW  COMPARISON:  Chest x-ray 12/17/2013.  FINDINGS: Lung volumes are low. Opacity at the left lung base may reflect atelectasis and/or consolidation. Small left pleural effusion. Right lung is clear. No evidence of pulmonary edema. Mild diffuse interstitial prominence, similar to numerous prior examinations. Large hiatal hernia. Heart size is normal. Upper mediastinal contours are within normal limits. Atherosclerosis in the thoracic aorta.  IMPRESSION: 1. Atelectasis and/or consolidation in the left lower lobe. Small left-sided pleural effusion. Followup PA and lateral chest X-ray is recommended in 3-4 weeks following trial of antibiotic therapy to ensure  resolution and exclude underlying malignancy. 2. Moderate sized hiatal hernia. 3. Atherosclerosis.   Electronically Signed   By: Trudie Reed M.D.   On: 02/03/2015 19:26   Ct Head Wo Contrast  02/03/2015   CLINICAL DATA:  81 year old female with history of trauma from a fall at home 1 week ago with injury to the face. Visible hematoma over the left cheek bone. Weakness and lethargy. Emesis for 1 week.  EXAM: CT HEAD WITHOUT CONTRAST  CT MAXILLOFACIAL WITHOUT CONTRAST  CT CERVICAL SPINE WITHOUT CONTRAST  TECHNIQUE: Multidetector CT imaging of the head, cervical spine, and maxillofacial structures were performed using the standard protocol without intravenous contrast. Multiplanar CT image reconstructions of the cervical spine and maxillofacial structures were also generated.  COMPARISON:  PET-CT 12/17/2013.  Cervical spine CT 12/17/2013.  FINDINGS: CT HEAD FINDINGS  Patchy and confluent areas of decreased attenuation are noted throughout the deep and periventricular white matter of the cerebral hemispheres bilaterally, compatible with chronic microvascular ischemic disease. No acute displaced skull fractures are identified. No acute intracranial abnormality. Specifically, no evidence of acute post-traumatic intracranial hemorrhage, no definite regions of acute/subacute cerebral ischemia, no focal mass, mass effect, hydrocephalus or abnormal intra or extra-axial fluid collections. The visualized paranasal sinuses and mastoids are well pneumatized.  CT MAXILLOFACIAL FINDINGS  Extensive high attenuation soft tissue swelling overlying the left maxilla, compatible with the reported hematoma. No underlying displaced facial bone fractures. Pterygoid plates are intact. Mandibular condyles are located bilaterally. Bilateral globes and retro-orbital soft tissues are grossly normal in appearance. The patient is edentulous.  CT CERVICAL SPINE FINDINGS  No acute displaced cervical spine fractures. Exaggeration of normal  cervical lordosis, likely chronic. Alignment is otherwise anatomic. Mild multilevel degenerative disc disease and facet arthropathy. Visualized portions of the upper thorax are remarkable for scattered areas of mild septal thickening and some bilateral apical pleuroparenchymal thickening, presumably areas of post infectious or inflammatory scarring.  IMPRESSION: 1. Large hematoma overlying the left maxilla. No underlying displaced facial bone fractures. 2. No acute intracranial abnormalities. 3. No evidence of significant acute traumatic injury to the cervical spine. 4. Chronic microvascular ischemic changes in the cerebral white matter. 5. Mild multilevel degenerative disc disease and cervical spondylosis, as above. 6. Additional incidental findings, as above.   Electronically Signed  By: Trudie Reed M.D.   On: 02/03/2015 21:28   Ct Chest W Contrast  02/04/2015   CLINICAL DATA:  Hemoptysis  EXAM: CT CHEST WITH CONTRAST  TECHNIQUE: Multidetector CT imaging of the chest was performed during intravenous contrast administration.  CONTRAST:  50mL OMNIPAQUE IOHEXOL 300 MG/ML  SOLN  COMPARISON:  None.  FINDINGS: Tiny hypodensity in the right lobe of the thyroid gland and image 8.  10 mm Selmon axis diameter precarinal node on image 22. There are enhancing soft tissue densities below the carina on image 26 which may represent a conglomeration of small lymph nodes. Hyland axis diameter is 9 mm.  There is mild wall thickening of the entire length of the esophagus. No mediastinal emphysema. There is fluid density within the posterior and lower mediastinum adjacent to the hiatal hernia.  Large hiatal hernia containing much of the stomach is present.  Bilateral small pleural effusions.  No pneumothorax.  Left lower lobe consolidation versus collapse is present. This is associated with intermittent obstruction of the central left lower lobe airways. It is difficult to determine if this is due to mucous plugging or an  endobronchial lesion. There is some volume loss at the right lung base. There may be some material within the right lower lobe airways. Ground-glass opacities in the right upper lobe are noted. The largest is 2.7 x 2.0 cm.  Stable mild T10 compression compare with prior CT abdominal imaging. Healed or healing left posterior rib fractures are present.  Stable appearance of the abdomen compared yesterday.  IMPRESSION: Bilateral pleural effusions and bibasilar atelectasis versus airspace disease as described. There is obstruction of the small airways particularly in the left lower lobe. This may be due to mucous plugging, however endobronchial lesion is not excluded. Bronchoscopy may be helpful.  Ground-glass opacities in the right upper lobe as described. This is a nonspecific finding. Followup in 3 months is recommended by CT to ensure resolution.  Large hiatal hernia  There is wall thickening of the esophagus. There is also fluid density within the mediastinum adjacent to the esophagus. There is no emphysema. Inflammatory process is not excluded.  Mild mediastinal adenopathy. Inflammatory and neoplastic etiology are considerations. Follow-up CT in 3 months is recommended to ensure resolution. Alternatively, PET-CT can be performed.   Electronically Signed   By: Jolaine Click M.D.   On: 02/04/2015 11:24   Ct Cervical Spine Wo Contrast  02/03/2015   CLINICAL DATA:  81 year old female with history of trauma from a fall at home 1 week ago with injury to the face. Visible hematoma over the left cheek bone. Weakness and lethargy. Emesis for 1 week.  EXAM: CT HEAD WITHOUT CONTRAST  CT MAXILLOFACIAL WITHOUT CONTRAST  CT CERVICAL SPINE WITHOUT CONTRAST  TECHNIQUE: Multidetector CT imaging of the head, cervical spine, and maxillofacial structures were performed using the standard protocol without intravenous contrast. Multiplanar CT image reconstructions of the cervical spine and maxillofacial structures were also generated.   COMPARISON:  PET-CT 12/17/2013.  Cervical spine CT 12/17/2013.  FINDINGS: CT HEAD FINDINGS  Patchy and confluent areas of decreased attenuation are noted throughout the deep and periventricular white matter of the cerebral hemispheres bilaterally, compatible with chronic microvascular ischemic disease. No acute displaced skull fractures are identified. No acute intracranial abnormality. Specifically, no evidence of acute post-traumatic intracranial hemorrhage, no definite regions of acute/subacute cerebral ischemia, no focal mass, mass effect, hydrocephalus or abnormal intra or extra-axial fluid collections. The visualized paranasal sinuses and mastoids are well pneumatized.  CT MAXILLOFACIAL FINDINGS  Extensive high attenuation soft tissue swelling overlying the left maxilla, compatible with the reported hematoma. No underlying displaced facial bone fractures. Pterygoid plates are intact. Mandibular condyles are located bilaterally. Bilateral globes and retro-orbital soft tissues are grossly normal in appearance. The patient is edentulous.  CT CERVICAL SPINE FINDINGS  No acute displaced cervical spine fractures. Exaggeration of normal cervical lordosis, likely chronic. Alignment is otherwise anatomic. Mild multilevel degenerative disc disease and facet arthropathy. Visualized portions of the upper thorax are remarkable for scattered areas of mild septal thickening and some bilateral apical pleuroparenchymal thickening, presumably areas of post infectious or inflammatory scarring.  IMPRESSION: 1. Large hematoma overlying the left maxilla. No underlying displaced facial bone fractures. 2. No acute intracranial abnormalities. 3. No evidence of significant acute traumatic injury to the cervical spine. 4. Chronic microvascular ischemic changes in the cerebral white matter. 5. Mild multilevel degenerative disc disease and cervical spondylosis, as above. 6. Additional incidental findings, as above.   Electronically  Signed   By: Trudie Reed M.D.   On: 02/03/2015 21:28   Ct Abdomen Pelvis W Contrast  02/04/2015   CLINICAL DATA:  Weakness, nausea and vomiting.  Leukocytosis.  EXAM: CT ABDOMEN AND PELVIS WITH CONTRAST  TECHNIQUE: Multidetector CT imaging of the abdomen and pelvis was performed using the standard protocol following bolus administration of intravenous contrast.  CONTRAST:  80mL OMNIPAQUE IOHEXOL 300 MG/ML  SOLN  COMPARISON:  08/14/2011  FINDINGS: Lower chest: There is a small left pleural effusion. There is consolidation and volume loss in the left lower lobe. There is a large hiatal hernia.  Hepatobiliary: There are a few small cysts in the liver, the largest measuring 5 mm. These are unchanged from 08/14/2011. No significant liver lesion. Gallbladder and bile ducts appear unremarkable.  Pancreas: Normal  Spleen: Normal  Adrenals/Urinary Tract: The adrenals and kidneys are normal in appearance. There is no urinary calculus evident. There is no hydronephrosis or ureteral dilatation. Collecting systems and ureters appear unremarkable. Urinary bladder is unremarkable.  Stomach/Bowel: Most of the stomach is above the diaphragm. Small bowel is unremarkable. There is extensive colonic diverticulosis without evidence of diverticulitis.  Vascular/Lymphatic: The abdominal aorta is normal in caliber. There is mild atherosclerotic calcification. There is no adenopathy in the abdomen or pelvis.  Reproductive: There is hysterectomy.  No adnexal abnormalities.  Other: No acute inflammatory changes are evident in the abdomen or pelvis. There is no ascites.  Musculoskeletal: There is no significant musculoskeletal lesion. There is moderately severe degenerative disc change at L5-S1  IMPRESSION: 1. Left lower lobe consolidation and volume loss. Small left pleural effusion. Given the clinical history, this could represent pneumonia with a parapneumonic effusion. 2. Sub cm benign hepatic cysts. 3. Large hiatal hernia. 4.  Diverticulosis.   Electronically Signed   By: Ellery Plunk M.D.   On: 02/04/2015 03:22   Dg Esophagus  02/05/2015   CLINICAL DATA:  History of hiatal hernia.  EXAM: ESOPHOGRAM/BARIUM SWALLOW  TECHNIQUE: Single contrast examination was performed using thin and thick barium.  FLUOROSCOPY TIME:  Fluoroscopy Time:  59 seconds  Number of Acquired Images:  0  COMPARISON:  None.  FINDINGS: Extremely limited examination secondary to difficulty in patient positioning. The entire examination was performed in the supine semi upright position, through a straw.  There was normal pharyngeal anatomy. There are tertiary contractions of the esophagus. Contrast flowed freely through the esophagus without evidence of stricture or mass. There was normal esophageal mucosa without evidence  of irregularity or ulceration. No evidence of reflux. There is a large hiatal hernia with the entirety of the stomach within the thorax with organoaxial malrotation without obstruction.  IMPRESSION: 1. Large hiatal hernia with the entirety of the stomach above the diaphragm. No gastric outlet obstruction.   Electronically Signed   By: Elige Ko   On: 02/05/2015 17:42   Ct Maxillofacial Wo Cm  02/03/2015   CLINICAL DATA:  81 year old female with history of trauma from a fall at home 1 week ago with injury to the face. Visible hematoma over the left cheek bone. Weakness and lethargy. Emesis for 1 week.  EXAM: CT HEAD WITHOUT CONTRAST  CT MAXILLOFACIAL WITHOUT CONTRAST  CT CERVICAL SPINE WITHOUT CONTRAST  TECHNIQUE: Multidetector CT imaging of the head, cervical spine, and maxillofacial structures were performed using the standard protocol without intravenous contrast. Multiplanar CT image reconstructions of the cervical spine and maxillofacial structures were also generated.  COMPARISON:  PET-CT 12/17/2013.  Cervical spine CT 12/17/2013.  FINDINGS: CT HEAD FINDINGS  Patchy and confluent areas of decreased attenuation are noted throughout the  deep and periventricular white matter of the cerebral hemispheres bilaterally, compatible with chronic microvascular ischemic disease. No acute displaced skull fractures are identified. No acute intracranial abnormality. Specifically, no evidence of acute post-traumatic intracranial hemorrhage, no definite regions of acute/subacute cerebral ischemia, no focal mass, mass effect, hydrocephalus or abnormal intra or extra-axial fluid collections. The visualized paranasal sinuses and mastoids are well pneumatized.  CT MAXILLOFACIAL FINDINGS  Extensive high attenuation soft tissue swelling overlying the left maxilla, compatible with the reported hematoma. No underlying displaced facial bone fractures. Pterygoid plates are intact. Mandibular condyles are located bilaterally. Bilateral globes and retro-orbital soft tissues are grossly normal in appearance. The patient is edentulous.  CT CERVICAL SPINE FINDINGS  No acute displaced cervical spine fractures. Exaggeration of normal cervical lordosis, likely chronic. Alignment is otherwise anatomic. Mild multilevel degenerative disc disease and facet arthropathy. Visualized portions of the upper thorax are remarkable for scattered areas of mild septal thickening and some bilateral apical pleuroparenchymal thickening, presumably areas of post infectious or inflammatory scarring.  IMPRESSION: 1. Large hematoma overlying the left maxilla. No underlying displaced facial bone fractures. 2. No acute intracranial abnormalities. 3. No evidence of significant acute traumatic injury to the cervical spine. 4. Chronic microvascular ischemic changes in the cerebral white matter. 5. Mild multilevel degenerative disc disease and cervical spondylosis, as above. 6. Additional incidental findings, as above.   Electronically Signed   By: Trudie Reed M.D.   On: 02/03/2015 21:28    Microbiology: Recent Results (from the past 240 hour(s))  Urine culture     Status: None   Collection Time:  02/03/15  7:28 PM  Result Value Ref Range Status   Specimen Description URINE, RANDOM  Final   Special Requests NONE  Final   Colony Count NO GROWTH Performed at [facility]   Final   Culture NO GROWTH Performed at [facility]   Final   Report Status 02/04/2015 FINAL  Final  Culture, blood (routine x 2)     Status: None (Preliminary result)   Collection Time: 02/04/15  4:04 AM  Result Value Ref Range Status   Specimen Description RIGHT ANTECUBITAL  Final   Special Requests BOTTLES DRAWN AEROBIC AND ANAEROBIC  Final   Culture   Final           BLOOD CULTURE RECEIVED NO GROWTH TO DATE CULTURE WILL BE HELD FOR 5 DAYS  BEFORE ISSUING A FINAL NEGATIVE REPORT Performed at [facility]    Report Status PENDING  Incomplete  Culture, blood (routine x 2)     Status: None (Preliminary result)   Collection Time: 02/04/15  4:04 AM  Result Value Ref Range Status   Specimen Description BLOOD RIGHT HAND  Final   Special Requests BOTTLES DRAWN AEROBIC AND ANAEROBIC  Final   Culture   Final           BLOOD CULTURE RECEIVED NO GROWTH TO DATE CULTURE WILL BE HELD FOR 5 DAYS BEFORE ISSUING A FINAL NEGATIVE REPORT Performed at [facility]    Report Status PENDING  Incomplete  Respiratory virus panel     Status: None   Collection Time: 02/04/15  8:00 AM  Result Value Ref Range Status   Source - RVPAN NASOPHARYNGEAL SWAB  Corrected   Respiratory Syncytial Virus A Negative Negative Final   Respiratory Syncytial Virus B Negative Negative Final   Influenza A Negative Negative Final   Influenza B Negative Negative Final   Parainfluenza 1 Negative Negative Final   Parainfluenza 2 Negative Negative Final   Parainfluenza 3 Negative Negative Final   Metapneumovirus Negative Negative Final   Rhinovirus Negative Negative Final   Adenovirus Negative Negative Final    Comment: (NOTE) Performed At: [facility] [address] [place], [place] [id number] Mila Homer MD NW:[id number]   Culture, respiratory (NON-Expectorated)     Status: None   Collection Time: 02/04/15  8:20 AM  Result Value Ref Range Status   Specimen Description SPUTUM  Final   Special Requests NONE  Final   Gram Stain   Final    RARE WBC PRESENT,BOTH PMN AND MONONUCLEAR RARE SQUAMOUS EPITHELIAL CELLS PRESENT MODERATE GRAM NEGATIVE RODS FEW GRAM POSITIVE COCCI IN PAIRS IN CLUSTERS RARE YEAST    Culture   Final    MODERATE PSEUDOMONAS AERUGINOSA Performed at [facility]    Report Status 02/07/2015 FINAL  Final   Organism ID, Bacteria PSEUDOMONAS AERUGINOSA  Final      Susceptibility   Pseudomonas aeruginosa - MIC*    CEFEPIME 4 SENSITIVE Sensitive     CEFTAZIDIME 4 SENSITIVE Sensitive     CIPROFLOXACIN <=0.25 SENSITIVE Sensitive     GENTAMICIN 2 SENSITIVE Sensitive     IMIPENEM 2 SENSITIVE Sensitive     PIP/TAZO 8 SENSITIVE Sensitive     TOBRAMYCIN <=1 SENSITIVE Sensitive     * MODERATE PSEUDOMONAS AERUGINOSA  Culture, sputum-assessment     Status: None   Collection Time: 02/04/15  8:30 AM  Result Value Ref Range Status   Specimen Description SPUTUM  Final   Special Requests NONE  Final   Sputum evaluation   Final    THIS SPECIMEN IS ACCEPTABLE. RESPIRATORY CULTURE REPORT TO FOLLOW.   Report Status 02/04/2015 FINAL  Final     Labs: Basic Metabolic Panel:  Recent Labs Lab 02/03/15 1859 02/09/15 0446  NA 140 141  K 3.6 3.3*  CL 98* 105  CO2 29 30  GLUCOSE 144* 112*  BUN 15 16  CREATININE 0.64 0.59  CALCIUM 9.0 8.2*  MG  --  2.2  PHOS  --  3.2   Liver Function Tests:  Recent Labs Lab 02/03/15 1859 02/09/15 0446  AST 25 13*  ALT 11* 12*  ALKPHOS 100 73  BILITOT 0.8 0.3  PROT 7.2 5.6*  ALBUMIN 3.4* 2.5*    Recent Labs Lab 02/04/15 0036  LIPASE 10*  No results for input(s): AMMONIA in the last 168 hours. CBC:  Recent Labs Lab 02/03/15 1859 02/05/15 0755 02/06/15 0507 02/07/15 0415 02/09/15 0446  WBC 38.0* 14.3*  9.0 7.9 9.7  NEUTROABS 33.1*  --   --   --  6.4  HGB 14.2 11.2* 10.7* 10.7* 10.9*  HCT 43.3 34.3* 33.5* 32.5* 33.7*  MCV 94.1 96.1 94.9 93.7 95.2  PLT 476* 377 389 416* 476*   Cardiac Enzymes:  Recent Labs Lab 02/03/15 1916  TROPONINI <0.03   BNP: BNP (last 3 results) No results for input(s): BNP in the last 8760 hours.  ProBNP (last 3 results) No results for input(s): PROBNP in the last 8760 hours.  CBG: No results for input(s): GLUCAP in the last 168 hours.   Signed:  Garry Bochicchio, Scheryl Marten  [facility] 02/09/2015, 12:33 PM

## 2015-02-09 NOTE — Discharge Instructions (Signed)
Hiatal Hernia A hiatal hernia occurs when part of your stomach slides above the muscle that separates your abdomen from your chest (diaphragm). You can be born with a hiatal hernia (congenital), or it may develop over time. In almost all cases of hiatal hernia, only the top part of the stomach pushes through.  Many people have a hiatal hernia with no symptoms. The larger the hernia, the more likely that you will have symptoms. In some cases, a hiatal hernia allows stomach acid to flow back into the tube that carries food from your mouth to your stomach (esophagus). This may cause heartburn symptoms. Severe heartburn symptoms may mean you have developed a condition called gastroesophageal reflux disease (GERD).  CAUSES  Hiatal hernias are caused by a weakness in the opening (hiatus) where your esophagus passes through your diaphragm to attach to the upper part of your stomach. You may be born with a weakness in your hiatus, or a weakness can develop. RISK FACTORS Older age is a major risk factor for a hiatal hernia. Anything that increases pressure on your diaphragm can also increase your risk of a hiatal hernia. This includes:  Pregnancy.  Excess weight.  Frequent constipation. SIGNS AND SYMPTOMS  People with a hiatal hernia often have no symptoms. If symptoms develop, they are almost always caused by GERD. They may include:  Heartburn.  Belching.  Indigestion.  Trouble swallowing.  Coughing or wheezing.  Sore throat.  Hoarseness.  Chest pain. DIAGNOSIS  A hiatal hernia is sometimes found during an exam for another problem. Your health care provider may suspect a hiatal hernia if you have symptoms of GERD. Tests may be done to diagnose GERD. These may include:  X-rays of your stomach or chest.  An upper gastrointestinal (GI) series. This is an X-ray exam of your GI tract involving the use of a chalky liquid that you swallow. The liquid shows up clearly on the X-ray.  Endoscopy.  This is a procedure to look into your stomach using a thin, flexible tube that has a tiny camera and light on the end of it. TREATMENT  If you have no symptoms, you may not need treatment. If you have symptoms, treatment may include:  Dietary and lifestyle changes to help reduce GERD symptoms.  Medicines. These may include:  Over-the-counter antacids.  Medicines that make your stomach empty more quickly.  Medicines that block the production of stomach acid (H2 blockers).  Stronger medicines to reduce stomach acid (proton pump inhibitors).  You may need surgery to repair the hernia if other treatments are not helping. HOME CARE INSTRUCTIONS   Take all medicines as directed by your health care provider.  Quit smoking, if you smoke.  Try to achieve and maintain a healthy body weight.  Eat frequent small meals instead of three large meals a day. This keeps your stomach from getting too full.  Eat slowly.  Do not lie down right after eating.  Do noteat 1-2 hours before bed.   Do not drink beverages with caffeine. These include cola, coffee, cocoa, and tea.  Do not drink alcohol.  Avoid foods that can make symptoms of GERD worse. These may include:  Fatty foods.  Citrus fruits.  Other foods and drinks that contain acid.  Avoid putting pressure on your belly. Anything that puts pressure on your belly increases the amount of acid that may be pushed up into your esophagus.   Avoid bending over, especially after eating.  Raise the head of your bed   by putting blocks under the legs. This keeps your head and esophagus higher than your stomach.  Do not wear tight clothing around your chest or stomach.  Try not to strain when having a bowel movement, when urinating, or when lifting heavy objects. SEEK MEDICAL CARE IF:  Your symptoms are not controlled with medicines or lifestyle changes.  You are having trouble swallowing.  You have coughing or wheezing that will not  go away. SEEK IMMEDIATE MEDICAL CARE IF:  Your pain is getting worse.  Your pain spreads to your arms, neck, jaw, teeth, or back.  You have shortness of breath.  You sweat for no reason.  You feel sick to your stomach (nauseous) or vomit.  You vomit blood.  You have bright red blood in your stools.  You have black, tarry stools.  Document Released: 11/04/2003 Document Revised: 12/29/2013 Document Reviewed: 08/01/2013 ExitCare Patient Information 2015 [employer]. This information is not intended to replace advice given to you by your health care provider. Make sure you discuss any questions you have with your health care provider.  

## 2015-02-09 NOTE — Progress Notes (Signed)
Peripherally Inserted Central Catheter/Midline Placement  The IV Nurse has discussed with the patient and/or persons authorized to consent for the patient, the purpose of this procedure and the potential benefits and risks involved with this procedure.  The benefits include less needle sticks, lab draws from the catheter and patient may be discharged home with the catheter.  Risks include, but not limited to, infection, bleeding, blood clot (thrombus formation), and puncture of an artery; nerve damage and irregular heat beat.  Alternatives to this procedure were also discussed.  PICC/Midline Placement Documentation        Stacie Glaze Horton 02/09/2015, 8:39 AM

## 2015-02-09 NOTE — Progress Notes (Signed)
Patient ID: Nicole Key, female   DOB: 1927-12-19, 81 y.o.   MRN: [id number]    Subjective: Pt trying to eat some breakfast.  No emesis since being admitted.  Still feels pressure and a heavy weight in her chest.    Objective: Vital signs in last 24 hours: Temp:  [98.4 F (36.9 C)-98.5 F (36.9 C)] 98.5 F (36.9 C) (06/14 0524) Pulse Rate:  [88-91] 88 (06/14 0524) Resp:  [18] 18 (06/14 0524) BP: (127-133)/(53-63) 133/54 mmHg (06/14 0524) SpO2:  [93 %-97 %] 97 % (06/14 0938) Last BM Date: 02/03/15  Intake/Output from previous day: 06/13 0701 - 06/14 0700 In: 340 [P.O.:340] Out: 1250 [Urine:1250] Intake/Output this shift: Total I/O In: -  Out: 200 [Urine:200]  PE: Abd: soft, minimally tender in upper abdomen, +BS Hear: regular Lungs: clear with anterior auscultation  Lab Results:   Recent Labs  02/07/15 0415 02/09/15 0446  WBC 7.9 9.7  HGB 10.7* 10.9*  HCT 32.5* 33.7*  PLT 416* 476*   BMET  Recent Labs  02/09/15 0446  NA 141  K 3.3*  CL 105  CO2 30  GLUCOSE 112*  BUN 16  CREATININE 0.59  CALCIUM 8.2*   PT/INR No results for input(s): LABPROT, INR in the last 72 hours. CMP     Component Value Date/Time   NA 141 02/09/2015 0446   K 3.3* 02/09/2015 0446   CL 105 02/09/2015 0446   CO2 30 02/09/2015 0446   GLUCOSE 112* 02/09/2015 0446   BUN 16 02/09/2015 0446   CREATININE 0.59 02/09/2015 0446   CALCIUM 8.2* 02/09/2015 0446   PROT 5.6* 02/09/2015 0446   ALBUMIN 2.5* 02/09/2015 0446   AST 13* 02/09/2015 0446   ALT 12* 02/09/2015 0446   ALKPHOS 73 02/09/2015 0446   BILITOT 0.3 02/09/2015 0446   GFRNONAA >60 02/09/2015 0446   GFRAA >60 02/09/2015 0446   Lipase     Component Value Date/Time   LIPASE 10* 02/04/2015 0036       Studies/Results: No results found.  Anti-infectives: Anti-infectives    Start     Dose/Rate Route Frequency Ordered Stop   02/08/15 1000  amoxicillin-clavulanate (AUGMENTIN) 400-57 MG/5ML suspension 800 mg     800 mg Oral Every 12 hours 02/08/15 0913     02/07/15 1600  amoxicillin-clavulanate (AUGMENTIN) 875-125 MG per tablet 1 tablet  Status:  Discontinued     1 tablet Oral Every 12 hours 02/07/15 1346 02/08/15 0913   02/07/15 1000  azithromycin (ZITHROMAX) tablet 500 mg  Status:  Discontinued     500 mg Oral Daily 02/06/15 1054 02/07/15 1343   02/06/15 1700  cefUROXime (CEFTIN) tablet 250 mg  Status:  Discontinued     250 mg Oral 2 times daily with meals 02/06/15 1054 02/07/15 1343   02/04/15 0530  cefTRIAXone (ROCEPHIN) 1 g in dextrose 5 % 50 mL IVPB  Status:  Discontinued     1 g 100 mL/hr over 30 Minutes Intravenous Every 24 hours 02/04/15 0523 02/04/15 0639   02/04/15 0530  cefTRIAXone (ROCEPHIN) 1 g in dextrose 5 % 50 mL IVPB  Status:  Discontinued     1 g 100 mL/hr over 30 Minutes Intravenous Every 24 hours 02/04/15 0523 02/06/15 1054   02/04/15 0530  azithromycin (ZITHROMAX) 500 mg in dextrose 5 % 250 mL IVPB  Status:  Discontinued     500 mg 250 mL/hr over 60 Minutes Intravenous Every 24 hours 02/04/15 0523 02/06/15 1054   02/04/15 0345  cefTRIAXone (ROCEPHIN) 1 g in dextrose 5 % 50 mL IVPB     1 g 100 mL/hr over 30 Minutes Intravenous  Once 02/04/15 0335 02/04/15 0434   02/04/15 0345  azithromycin (ZITHROMAX) 500 mg in dextrose 5 % 250 mL IVPB     500 mg 250 mL/hr over 60 Minutes Intravenous  Once 02/04/15 0335 02/04/15 1900       Assessment/Plan  1. Large hiatal hernia  -patient is quite deconditioned and still getting over her aspiration PNA.  Her prealbumin is actually better than expected at 18.5.  Will hold off on TNA for now and just give Ensure, especially since there are no immediate plans for surgery  -After a very long d/w the patient, her grandson, and friend last night, Dr. Ezzard Standing feels that the patient is not conditioned well enough now to undergo a major operation.  Recommendations are for Ensure and to try and get nutrition as good as possible.  I discussed with the  patient the potential for rehab at SNF as this would likely be better and more beneficial than HH PT at home.  She states she would not want to go anywhere but home.  I'm not sure the patient has a great understanding of how big of an operation this is and I'm not sure she truly has the motivation to do the things she needs to do to get better to have an operation.   -Dr. Ezzard Standing is going to try and get in touch with Dr. Jacky Kindle today, who is her PCP, for his input. Aspiration PNA  -per primary  LOS: 5 days    OSBORNE,KELLY E 02/09/2015, 9:44 AM Pager: [phone]  Agree with above.  I discussed case with Dr. Jacky Kindle. He visited the patient in the hospital and supported our assessment that she is badly deconditioned and a poor operative candidate.  If she can increase her mobility and nutrition, she may be a surgical candidate.  Ovidio Kin, MD, [facility] Surgery Pager: [phone] Office phone:  [phone]

## 2015-02-09 NOTE — Clinical Social Work Placement (Signed)
Patient is now agreeable with plan for SNF after talking with Dr. Jacky Kindle. Patient is set to discharge to [facility] today. Patient & grandson, [name] aware. Discharge packet given to RN, Darl Pikes. [facility] called for transport to pickup at 3:30pm.     Lincoln Maxin, LCSW [facility] Clinical Social Worker cell #: [phone]    CLINICAL SOCIAL WORK PLACEMENT  NOTE  Date:  02/09/2015  Patient Details  Name: Nicole Key MRN: [id number] Date of Birth: Sep 13, 1927  Clinical Social Work is seeking post-discharge placement for this patient at the Skilled  Nursing Facility level of care (*CSW will initial, date and re-position this form in  chart as items are completed):  Yes   Patient/family provided with [facility] Clinical Social Work Department's list of facilities offering this level of care within the geographic area requested by the patient (or if unable, by the patient's family).  Yes   Patient/family informed of their freedom to choose among providers that offer the needed level of care, that participate in Medicare, Medicaid or managed care program needed by the patient, have an available bed and are willing to accept the patient.  Yes   Patient/family informed of [facility]'s ownership interest in [facility] and [facility], as well as of the fact that they are under no obligation to receive care at these facilities.  PASRR submitted to EDS on 02/09/15     PASRR number received on 02/09/15     Existing PASRR number confirmed on       FL2 transmitted to all facilities in geographic area requested by pt/family on 02/09/15     FL2 transmitted to all facilities within larger geographic area on       Patient informed that his/her managed care company has contracts with or will negotiate with certain facilities, including the following:        Yes   Patient/family informed of bed offers received.  Patient chooses bed at [facility]      Physician recommends and patient chooses bed at      Patient to be transferred to [facility] on 02/09/15.  Patient to be transferred to facility by [facility]     Patient family notified on 02/09/15 of transfer.  Name of family member notified:  patient's grandson, [name] via phone     PHYSICIAN       Additional Comment:    _______________________________________________ Arlyss Repress, LCSW 02/09/2015, 1:04 PM

## 2015-02-09 NOTE — Progress Notes (Signed)
PARENTERAL NUTRITION CONSULT NOTE - INITIAL  Pharmacy Consult for TPN Indication: dysphagia and N/V  Allergies  Allergen Reactions  . Shellfish Allergy     unknown    Patient Measurements: Height: 5\' 8"  (172.7 cm) Weight: 121 lb 4.1 oz (55 kg) IBW/kg (Calculated) : 63.9 Adjusted Body Weight:  Usual Weight:   Vital Signs: Temp: 98.5 F (36.9 C) (06/14 0524) Temp Source: Oral (06/14 0524) BP: 133/54 mmHg (06/14 0524) Pulse Rate: 88 (06/14 0524) Intake/Output from previous day: 06/13 0701 - 06/14 0700 In: 340 [P.O.:340] Out: 1250 [Urine:1250] Intake/Output from this shift:    Labs:  Recent Labs  02/07/15 0415 02/09/15 0446  WBC 7.9 9.7  HGB 10.7* 10.9*  HCT 32.5* 33.7*  PLT 416* 476*     Recent Labs  02/08/15 1125 02/09/15 0446  NA  --  141  K  --  3.3*  CL  --  105  CO2  --  30  GLUCOSE  --  112*  BUN  --  16  CREATININE  --  0.59  CALCIUM  --  8.2*  MG  --  2.2  PHOS  --  3.2  PROT  --  5.6*  ALBUMIN  --  2.5*  AST  --  13*  ALT  --  12*  ALKPHOS  --  73  BILITOT  --  0.3  PREALBUMIN 18.5  --    Estimated Creatinine Clearance: 45.5 mL/min (by C-G formula based on Cr of 0.59).   No results for input(s): GLUCAP in the last 72 hours.  Medical History: Past Medical History  Diagnosis Date  . Insomnia   . Fibromyalgia   . Arthritis   . Meniere's disease 1989  . Hypertension   . Anxiety   . Vertigo   . GERD (gastroesophageal reflux disease)    Insulin Requirements:  No current insulin orders  Current Nutrition:  Regular  IVF:  None  Central access: PICC 6/13  TPN start date: Anticipate 6/14 d/t no line access and no labs (to be drawn after PICC placement)  ASSESSMENT                                                                                                          HPI: 7 YOF presents with cough, shortness of breath and chest pain on 6/9. She has c/o dysphagia, seen by [facility] for giant hiatal hernia. Per surgery, hernia may  be causing obstruction causing N/V and possible aspiration. Patient states her weight has fluctuated.  Plan is surgical repair of hernia during this admission  Significant events:   Today:    Glucose - fasting slightly elevated on am labs  Electrolytes - K = 3.3, Corr Ca = 9.4 (WNL)  Renal - SCr WNL  LFTs - AST/ALT low, others WNL  TGs -  173 (6/13)  Prealbumin - 18.5 (6/13)  NUTRITIONAL GOALS  RD recommendations: 50-60gm protein, 1250-1450kcals Clinimix 5/15 at a goal rate of 32ml/hr + 20% fat emulsion at 21ml/hr to provide: 66g/day protein, 1273Kcal/day.   PLAN                                                                                                                         NOTE - TPN ORDERS CANCELLED BY SURGERY At 1800 today:  Start Clinimix E5/15 at 70ml/hr.  20% fat emulsion at 10ml/hr.  Plan to advance as tolerated to the goal rate.  TPN to contain standard multivitamins and trace elements.  Replace KCl  Add q6h sensitive SSI with start of TPN   TPN lab panels on Mondays & Thursdays.  F/u daily.  Juliette Alcide, PharmD, BCPS.   Pager: [phone]  02/09/2015,7:06 AM  ADDENDUM:  [facility] has cancelled plans for TPN at this time.  Will cancel all TPN orders.  Juliette Alcide, PharmD, BCPS.   Pager: [phone] 02/09/2015 10:42 AM

## 2015-02-09 NOTE — Progress Notes (Signed)
Speech Language Pathology Treatment: Dysphagia  Patient Details Name: Nicole Key MRN: [id number] DOB: 02/12/1928 Today's Date: 02/09/2015 Time: 5974-1638 SLP Time Calculation (min) (ACUTE ONLY): 10 min  Assessment / Plan / Recommendation Clinical Impression  SLP visit to provide pt with education and reinforce effective compensation strategies.  Observed pt consuming medicine with icecream and room temperature water.  Pt continues to wince with swallow indicating discomfort but admits that odynophagia is much better with magic mouthrinse provided before meals.   Note results of her esophagram indicating large hiatal hernia.    Respiratory status and overall status appears much improved today compared to last Friday June 10th- pt agrees. Wrote swallow precaution signs as reminders for pt and staff working with her to support her dysphagia needs.  Especially in light of plan to dc to SNF today, recommended pt take signs with her.  Pt expressed gratitude for slp time/intervention.  Note plans for possible hernia surgery in the future.  Encouraged pt to continue po as tolerated - small amounts at a time, avoiding foods/drinks that make symptoms worse.  Cold, carbonated and spicy items worsen symptoms per pt.     HPI Other Pertinent Information: 81 yo female admitted to [facility] with hemoptysis diagnosed with CAP.  PMH + for recent fall, ? esophageal stricture - as she underwent dilatation in 2005.  Per CT chest, pt with large hiatal hernia, ? mucus plug in LLL, wall thickening of esophagus, fluid density in mediastinum.  Per son, pt with very poor intake in the last 2 weeks since fall with weight loss.  Pt reports significant pain with swallowing liquids and sensation of poor clearance.  Swallow evaluation ordered.    Pertinent Vitals Pain Assessment: No/denies pain  SLP Plan  All goals met;Discharge SLP treatment due to (comment) (goals met)    Recommendations Diet recommendations: Regular;Thin  liquid Liquids provided via: Cup;Straw Medication Administration: Crushed with puree (crush if large, whole if small) Compensations: Slow rate;Small sips/bites Postural Changes and/or Swallow Maneuvers: Seated upright 90 degrees;Upright 30-60 min after meal              Oral Care Recommendations: Oral care BID Follow up Recommendations: None Plan: All goals met;Discharge SLP treatment due to (comment) (goals met)    [place], [place] [facility] SLP [phone]

## 2015-02-10 LAB — CULTURE, BLOOD (ROUTINE X 2)
CULTURE: NO GROWTH
Culture: NO GROWTH

## 2015-02-22 ENCOUNTER — Other Ambulatory Visit: Payer: Self-pay

## 2015-06-16 ENCOUNTER — Ambulatory Visit (HOSPITAL_COMMUNITY)
Admission: RE | Admit: 2015-06-16 | Discharge: 2015-06-16 | Disposition: A | Discharge status: Home / Self Care | Payer: Medicare Other | Source: Ambulatory Visit | Attending: Vascular Surgery | Admitting: Vascular Surgery

## 2015-06-16 ENCOUNTER — Other Ambulatory Visit (HOSPITAL_COMMUNITY): Payer: Self-pay | Admitting: Internal Medicine

## 2015-06-16 DIAGNOSIS — R609 Edema, unspecified: Secondary | ICD-10-CM | POA: Insufficient documentation

## 2015-06-16 DIAGNOSIS — I10 Essential (primary) hypertension: Secondary | ICD-10-CM | POA: Insufficient documentation

## 2016-10-18 ENCOUNTER — Observation Stay (HOSPITAL_COMMUNITY): Payer: Medicare Other

## 2016-10-18 ENCOUNTER — Encounter (HOSPITAL_COMMUNITY): Payer: Self-pay | Admitting: *Deleted

## 2016-10-18 ENCOUNTER — Observation Stay (HOSPITAL_COMMUNITY)
Admission: EM | Admit: 2016-10-18 | Discharge: 2016-10-19 | Disposition: A | Discharge status: Home / Self Care | Payer: Medicare Other | Attending: Internal Medicine | Admitting: Internal Medicine

## 2016-10-18 ENCOUNTER — Emergency Department (HOSPITAL_COMMUNITY): Payer: Medicare Other

## 2016-10-18 DIAGNOSIS — H8109 Meniere's disease, unspecified ear: Secondary | ICD-10-CM | POA: Diagnosis not present

## 2016-10-18 DIAGNOSIS — R109 Unspecified abdominal pain: Secondary | ICD-10-CM | POA: Diagnosis not present

## 2016-10-18 DIAGNOSIS — R4182 Altered mental status, unspecified: Secondary | ICD-10-CM

## 2016-10-18 DIAGNOSIS — G8929 Other chronic pain: Secondary | ICD-10-CM | POA: Insufficient documentation

## 2016-10-18 DIAGNOSIS — F0390 Unspecified dementia without behavioral disturbance: Secondary | ICD-10-CM | POA: Diagnosis not present

## 2016-10-18 DIAGNOSIS — F419 Anxiety disorder, unspecified: Secondary | ICD-10-CM | POA: Diagnosis present

## 2016-10-18 DIAGNOSIS — I700 Atherosclerosis of aorta: Secondary | ICD-10-CM | POA: Diagnosis not present

## 2016-10-18 DIAGNOSIS — M1990 Unspecified osteoarthritis, unspecified site: Secondary | ICD-10-CM | POA: Insufficient documentation

## 2016-10-18 DIAGNOSIS — Z681 Body mass index (BMI) 19 or less, adult: Secondary | ICD-10-CM | POA: Insufficient documentation

## 2016-10-18 DIAGNOSIS — M797 Fibromyalgia: Secondary | ICD-10-CM | POA: Diagnosis not present

## 2016-10-18 DIAGNOSIS — G9340 Encephalopathy, unspecified: Principal | ICD-10-CM | POA: Diagnosis present

## 2016-10-18 DIAGNOSIS — R079 Chest pain, unspecified: Secondary | ICD-10-CM | POA: Diagnosis present

## 2016-10-18 DIAGNOSIS — Z91013 Allergy to seafood: Secondary | ICD-10-CM | POA: Diagnosis not present

## 2016-10-18 DIAGNOSIS — I10 Essential (primary) hypertension: Secondary | ICD-10-CM

## 2016-10-18 DIAGNOSIS — E43 Unspecified severe protein-calorie malnutrition: Secondary | ICD-10-CM | POA: Diagnosis present

## 2016-10-18 DIAGNOSIS — K219 Gastro-esophageal reflux disease without esophagitis: Secondary | ICD-10-CM | POA: Diagnosis not present

## 2016-10-18 DIAGNOSIS — G4700 Insomnia, unspecified: Secondary | ICD-10-CM | POA: Insufficient documentation

## 2016-10-18 LAB — CBC
HCT: 37.9 % (ref 36.0–46.0)
HEMOGLOBIN: 12 g/dL (ref 12.0–15.0)
MCH: 30.2 pg (ref 26.0–34.0)
MCHC: 31.7 g/dL (ref 30.0–36.0)
MCV: 95.2 fL (ref 78.0–100.0)
PLATELETS: 408 10*3/uL — AB (ref 150–400)
RBC: 3.98 MIL/uL (ref 3.87–5.11)
RDW: 14.7 % (ref 11.5–15.5)
WBC: 13 10*3/uL — AB (ref 4.0–10.5)

## 2016-10-18 LAB — LIPASE, BLOOD: Lipase: 11 U/L (ref 11–51)

## 2016-10-18 LAB — URINALYSIS, ROUTINE W REFLEX MICROSCOPIC
BILIRUBIN URINE: NEGATIVE
GLUCOSE, UA: NEGATIVE mg/dL
Hgb urine dipstick: NEGATIVE
KETONES UR: NEGATIVE mg/dL
Leukocytes, UA: NEGATIVE
Nitrite: NEGATIVE
PH: 6 (ref 5.0–8.0)
Protein, ur: NEGATIVE mg/dL
Specific Gravity, Urine: 1.008 (ref 1.005–1.030)

## 2016-10-18 LAB — DIFFERENTIAL
BASOS ABS: 0 10*3/uL (ref 0.0–0.1)
Basophils Relative: 0 %
EOS PCT: 1 %
Eosinophils Absolute: 0.1 10*3/uL (ref 0.0–0.7)
LYMPHS ABS: 2.7 10*3/uL (ref 0.7–4.0)
LYMPHS PCT: 21 %
Monocytes Absolute: 1 10*3/uL (ref 0.1–1.0)
Monocytes Relative: 7 %
NEUTROS ABS: 9.2 10*3/uL — AB (ref 1.7–7.7)
NEUTROS PCT: 71 %

## 2016-10-18 LAB — COMPREHENSIVE METABOLIC PANEL
ALBUMIN: 3.6 g/dL (ref 3.5–5.0)
ALK PHOS: 63 U/L (ref 38–126)
ALT: 12 U/L — ABNORMAL LOW (ref 14–54)
ANION GAP: 7 (ref 5–15)
AST: 20 U/L (ref 15–41)
BUN: 11 mg/dL (ref 6–20)
CALCIUM: 9.6 mg/dL (ref 8.9–10.3)
CO2: 33 mmol/L — AB (ref 22–32)
Chloride: 101 mmol/L (ref 101–111)
Creatinine, Ser: 0.75 mg/dL (ref 0.44–1.00)
GFR calc Af Amer: >60 mL/min (ref >60)
GFR calc non Af Amer: >60 mL/min (ref >60)
GLUCOSE: 100 mg/dL — AB (ref 65–99)
POTASSIUM: 4.6 mmol/L (ref 3.5–5.1)
SODIUM: 141 mmol/L (ref 135–145)
Total Bilirubin: 0.5 mg/dL (ref 0.3–1.2)
Total Protein: 7.6 g/dL (ref 6.5–8.1)

## 2016-10-18 LAB — TROPONIN I: Troponin I: <0.03 ng/mL (ref <0.03)

## 2016-10-18 LAB — ETHANOL: Alcohol, Ethyl (B): <5 mg/dL (ref <5)

## 2016-10-18 LAB — RAPID URINE DRUG SCREEN, HOSP PERFORMED
AMPHETAMINES: NOT DETECTED
BARBITURATES: NOT DETECTED
Benzodiazepines: POSITIVE — AB
COCAINE: NOT DETECTED
OPIATES: POSITIVE — AB
TETRAHYDROCANNABINOL: NOT DETECTED

## 2016-10-18 LAB — I-STAT CHEM 8, ED
BUN: 15 mg/dL (ref 6–20)
CHLORIDE: 98 mmol/L — AB (ref 101–111)
CREATININE: 0.8 mg/dL (ref 0.44–1.00)
Calcium, Ion: 1.13 mmol/L — ABNORMAL LOW (ref 1.15–1.40)
Glucose, Bld: 98 mg/dL (ref 65–99)
HEMATOCRIT: 39 % (ref 36.0–46.0)
Hemoglobin: 13.3 g/dL (ref 12.0–15.0)
POTASSIUM: 4.5 mmol/L (ref 3.5–5.1)
SODIUM: 142 mmol/L (ref 135–145)
TCO2: 33 mmol/L (ref 0–100)

## 2016-10-18 LAB — VITAMIN B12: Vitamin B-12: 405 pg/mL (ref 180–914)

## 2016-10-18 LAB — PROTIME-INR
INR: 1.03
PROTHROMBIN TIME: 13.5 s (ref 11.4–15.2)

## 2016-10-18 LAB — APTT: APTT: 37 s — AB (ref 24–36)

## 2016-10-18 LAB — I-STAT TROPONIN, ED: Troponin i, poc: 0 ng/mL (ref 0.00–0.08)

## 2016-10-18 MED ORDER — ACETAMINOPHEN 650 MG RE SUPP: 650.0000 mg | Freq: Four times a day (QID) | RECTAL | Status: DC | PRN

## 2016-10-18 MED ORDER — ENOXAPARIN SODIUM 40 MG/0.4ML ~~LOC~~ SOLN
40.0000 mg | SUBCUTANEOUS | Status: DC
  Administered 2016-10-18: 40 mg via SUBCUTANEOUS
  Filled 2016-10-18: qty 0.4

## 2016-10-18 MED ORDER — AMLODIPINE BESYLATE 5 MG PO TABS: 5.0000 mg | ORAL_TABLET | Freq: Every day | ORAL | Status: DC

## 2016-10-18 MED ORDER — PANTOPRAZOLE SODIUM 40 MG PO TBEC
40.0000 mg | DELAYED_RELEASE_TABLET | Freq: Two times a day (BID) | ORAL | Status: DC
  Administered 2016-10-18 – 2016-10-19 (×2): 40 mg via ORAL
  Filled 2016-10-18 (×2): qty 1

## 2016-10-18 MED ORDER — MAGIC MOUTHWASH W/LIDOCAINE
5.0000 mL | Freq: Three times a day (TID) | ORAL | Status: DC
  Administered 2016-10-18 – 2016-10-19 (×2): 5 mL via ORAL
  Filled 2016-10-18 (×2): qty 5

## 2016-10-18 MED ORDER — ALPRAZOLAM 0.25 MG PO TABS: 0.2500 mg | ORAL_TABLET | Freq: Every evening | ORAL | Status: DC | PRN

## 2016-10-18 MED ORDER — SODIUM CHLORIDE 0.9 % IV SOLN
INTRAVENOUS | Status: AC
  Administered 2016-10-18: 1000 mL via INTRAVENOUS

## 2016-10-18 MED ORDER — GI COCKTAIL ~~LOC~~
30.0000 mL | Freq: Once | ORAL | Status: AC
  Administered 2016-10-18: 30 mL via ORAL
  Filled 2016-10-18: qty 30

## 2016-10-18 MED ORDER — SODIUM CHLORIDE 0.9% FLUSH
3.0000 mL | Freq: Two times a day (BID) | INTRAVENOUS | Status: DC
  Administered 2016-10-19: 3 mL via INTRAVENOUS

## 2016-10-18 MED ORDER — SENNOSIDES-DOCUSATE SODIUM 8.6-50 MG PO TABS
1.0000 | ORAL_TABLET | Freq: Every evening | ORAL | Status: DC | PRN
  Filled 2016-10-18: qty 1

## 2016-10-18 MED ORDER — ALPRAZOLAM 0.25 MG PO TABS: 0.2500 mg | ORAL_TABLET | Freq: Two times a day (BID) | ORAL | Status: DC | PRN

## 2016-10-18 MED ORDER — METOPROLOL TARTRATE 25 MG PO TABS
12.5000 mg | ORAL_TABLET | Freq: Two times a day (BID) | ORAL | Status: DC
  Administered 2016-10-18 – 2016-10-19 (×2): 12.5 mg via ORAL
  Filled 2016-10-18 (×2): qty 1

## 2016-10-18 MED ORDER — ACETAMINOPHEN 325 MG PO TABS
650.0000 mg | ORAL_TABLET | Freq: Four times a day (QID) | ORAL | Status: DC | PRN
  Administered 2016-10-19: 650 mg via ORAL
  Filled 2016-10-18: qty 2

## 2016-10-18 MED ORDER — BENZONATATE 100 MG PO CAPS: 100.0000 mg | ORAL_CAPSULE | Freq: Three times a day (TID) | ORAL | Status: DC | PRN

## 2016-10-18 MED ORDER — LABETALOL HCL 5 MG/ML IV SOLN
10.0000 mg | Freq: Once | INTRAVENOUS | Status: AC
  Administered 2016-10-18: 10 mg via INTRAVENOUS
  Filled 2016-10-18: qty 4

## 2016-10-18 MED ORDER — ENSURE ENLIVE PO LIQD: 237.0000 mL | Freq: Three times a day (TID) | ORAL | Status: DC

## 2016-10-18 MED ORDER — TRAZODONE HCL 50 MG PO TABS: 50.0000 mg | ORAL_TABLET | Freq: Every evening | ORAL | Status: DC | PRN

## 2016-10-18 NOTE — Progress Notes (Signed)
Nicole BlonderDorothy L Key [id number] Admitted to 5W20: 10/18/2016 7:48 PM Attending Provider: Ozella Rocksavid J Merrell, MD    Nicole Blonderorothy L Key is a 83 y.o. female patient admitted from ED awake, alert  & orientated  X 3,  Full Code, VSS - Blood pressure (!) 165/91, pulse 98, temperature 98.3 F (36.8 C), resp. rate 17, SpO2 97 %., no c/o shortness of breath, no c/o chest pain, no distress noted. Tele # [id number] placed and pt is currently running:normal sinus rhythm.   IV site WDL:  antecubital left, condition patent and no redness with a transparent dsg that's clean dry and intact.  Allergies:   Allergies  Allergen Reactions  . Shellfish Allergy Other (See Comments)    unknown     Past Medical History:  Diagnosis Date  . Anxiety   . Arthritis   . Fibromyalgia   . GERD (gastroesophageal reflux disease)   . Hypertension   . Insomnia   . Meniere's disease 1989  . Vertigo     History:  Will be obtained from the patient.  Pt orientation to unit, room and routine. Information packet given to patient/family and safety video watched.  Admission INP armband ID verified with patient/family, and in place. SR up x 2, fall risk assessment complete with Patient and family verbalizing understanding of risks associated with falls. Pt verbalizes an understanding of how to use the call bell and to call for help before getting out of bed.  Skin, clean-dry- intact with evidence of bruising and hematoma to right hand, no skin tears.   No evidence of skin break down noted on exam.  Text paged Dr. Konrad DoloresMerrell to clarify troponin order and get diet order.    Will cont to monitor and assist as needed.  Joana ReamerJohnson, Nicole Key C, RN 10/18/2016 7:48 PM2

## 2016-10-18 NOTE — ED Provider Notes (Signed)
[facility]-EMERGENCY DEPT Provider Note   CSN: [id number] Arrival date & time: 10/18/16  1049     History   Chief Complaint Chief Complaint  Patient presents with  . Chest Pain  . Altered Mental Status    HPI Nicole Key is a 83 y.o. female.  HPI  The patient is an 83 year old female, history of hypertension, anxiety, history of community-acquired pneumonia.  She presents with family members after awakening this morning and having some altered mental status. Last night she started to complain of some upper epigastric and left-sided chest pain. This has been intermittent. The patient does not have a history of dementia and in fact usually can carry on a very good conversation, she can walk by herself with a walker and had to get out of bed to the bedside commode. Today she has not been able to do any of these tasks and is extremely confused not answering any questions appropriately. There is been no other complaints including fevers or vomiting per family members.  Past Medical History:  Diagnosis Date  . Anxiety   . Arthritis   . Fibromyalgia   . GERD (gastroesophageal reflux disease)   . Hypertension   . Insomnia   . Meniere's disease 1989  . Vertigo     Patient Active Problem List   Diagnosis Date Noted  . Encephalopathy 10/18/2016  . Incarcerated hiatal hernia with obstruction 02/07/2015  . Acute respiratory failure with hypoxia (HCC)   . Aspiration pneumonia (HCC)   . CAP (community acquired pneumonia) 02/04/2015  . Sepsis (HCC) 02/04/2015  . Anxiety 02/04/2015  . Essential hypertension 02/04/2015  . Fall 02/04/2015  . Generalized weakness 02/04/2015  . GERD (gastroesophageal reflux disease) 02/04/2015  . Protein-calorie malnutrition, severe (HCC) 02/04/2015  . Syncope 08/15/2013  . Facial contusion 08/15/2013  . Unstable gait 08/15/2013  . Depression 08/15/2013  . Overdose drug 07/18/2011  . Osteoporosis 07/18/2011  . Meniere's disease 07/18/2011    Past  Surgical History:  Procedure Laterality Date  . ABDOMINAL HYSTERECTOMY    . APPENDECTOMY    . BILATERAL TOTAL MASTECTOMY WITH AXILLARY LYMPH NODE DISSECTION  1981    OB History    No data available       Home Medications    Prior to Admission medications   Medication Sig Start Date End Date Taking? Authorizing Provider  metoprolol tartrate (LOPRESSOR) 25 MG tablet Take 0.5 tablets (12.5 mg total) by mouth 2 (two) times daily. 02/09/15  Yes Jerald Kief, MD  ALPRAZolam Prudy Feeler) 0.25 MG tablet Take 0.25 mg by mouth 2 (two) times daily as needed (anxiety).  08/09/13   Historical Provider, MD  Alum & Mag Hydroxide-Simeth (MAGIC MOUTHWASH W/LIDOCAINE) SOLN Take 5 mLs by mouth 4 (four) times daily -  with meals and at bedtime. 02/09/15   Jerald Kief, MD  amLODipine (NORVASC) 5 MG tablet Take 1 tablet (5 mg total) by mouth daily. 08/18/13   Geoffry Paradise, MD  benzonatate (TESSALON) 100 MG capsule Take 1 capsule (100 mg total) by mouth 3 (three) times daily as needed for cough. 02/09/15   Jerald Kief, MD  feeding supplement, ENSURE ENLIVE, (ENSURE ENLIVE) LIQD Take 237 mLs by mouth 3 (three) times daily between meals. 02/09/15   Jerald Kief, MD  HYDROcodone-acetaminophen (NORCO/VICODIN) 5-325 MG per tablet Take 1 tablet by mouth every 6 (six) hours as needed for moderate pain.    Historical Provider, MD  HYDROcodone-acetaminophen (NORCO/VICODIN) 5-325 MG per tablet Take 1 tablet  by mouth every 6 (six) hours as needed for moderate pain. 02/09/15   Jerald KiefStephen K Chiu, MD  Nutritional Supplements (ENSURE ACTIVE HIGH PROTEIN) LIQD Take 1 Can by mouth 3 (three) times daily. Patient not taking: Reported on 11/23/2014 12/17/13   Dahlia ClientHannah Muthersbaugh, PA-C  pantoprazole (PROTONIX) 40 MG tablet Take 1 tablet (40 mg total) by mouth 2 (two) times daily before a meal. 02/09/15   Jerald KiefStephen K Chiu, MD  promethazine (PHENERGAN) 25 MG tablet Take 25 mg by mouth every 8 (eight) hours as needed for nausea or  vomiting.    Historical Provider, MD  traMADol (ULTRAM) 50 MG tablet Take 1 tablet (50 mg total) by mouth every 6 (six) hours as needed (pain). Maximum dose= 8 tablets per day as needed for pain 02/09/15   Jerald KiefStephen K Chiu, MD  traZODone (DESYREL) 50 MG tablet Take 1 tablet (50 mg total) by mouth at bedtime as needed for sleep. 02/09/15   Jerald KiefStephen K Chiu, MD    Family History Family History  Problem Relation Age of Onset  . Aortic aneurysm Mother     Had ruptured aorta  . Lung cancer Father   . Heart disease Father     Social History Social History  Substance Use Topics  . Smoking status: Never Smoker  . Smokeless tobacco: Never Used  . Alcohol use No     Comment: denies alcohol use     Allergies   Shellfish allergy   Review of Systems Review of Systems  Unable to perform ROS: Dementia     Physical Exam Updated Vital Signs BP 151/82   Pulse 89   Temp 98.3 F (36.8 C)   Resp 22   SpO2 97%   Physical Exam  Constitutional: She appears well-developed and well-nourished. No distress.  HENT:  Head: Normocephalic and atraumatic.  Mouth/Throat: Oropharynx is clear and moist. No oropharyngeal exudate.  Eyes: Conjunctivae and EOM are normal. Pupils are equal, round, and reactive to light. Right eye exhibits no discharge. Left eye exhibits no discharge. No scleral icterus.  Neck: Normal range of motion. Neck supple. No JVD present. No thyromegaly present.  Cardiovascular: Normal rate, regular rhythm and intact distal pulses.  Exam reveals no gallop and no friction rub.   Murmur ( systolic soft) heard. Pulmonary/Chest: Effort normal and breath sounds normal. No respiratory distress. She has no wheezes. She has no rales.  Abdominal: Soft. Bowel sounds are normal. She exhibits no distension and no mass. There is no tenderness.  Musculoskeletal: Normal range of motion. She exhibits no edema or tenderness.  Lymphadenopathy:    She has no cervical adenopathy.  Neurological: She is  alert. Coordination normal.  Able to move all 4 extremities to command, seems to have symmetrical grips, symmetrical strength, symmetrical sensation, cranial nerves III through XII appear normal. She is unable to answer my questions and when I ask her her father's name, she gives the date of birth and then goes into some other dialogue about her father. She cannot tell me her mother's name, she tells me that she grew up in this county but can't give any other valuable information about her childhood or younger years. Her sentences trail off and she stops talking when she loses her train of thought  Skin: Skin is warm and dry. No rash noted. No erythema.  Psychiatric: She has a normal mood and affect. Her behavior is normal.  Nursing note and vitals reviewed.    ED Treatments / Results  Labs (all  labs ordered are listed, but only abnormal results are displayed) Labs Reviewed  APTT - Abnormal; Notable for the following:       Result Value   aPTT 37 (*)    All other components within normal limits  CBC - Abnormal; Notable for the following:    WBC 13.0 (*)    Platelets 408 (*)    All other components within normal limits  DIFFERENTIAL - Abnormal; Notable for the following:    Neutro Abs 9.2 (*)    All other components within normal limits  COMPREHENSIVE METABOLIC PANEL - Abnormal; Notable for the following:    CO2 33 (*)    Glucose, Bld 100 (*)    ALT 12 (*)    All other components within normal limits  RAPID URINE DRUG SCREEN, HOSP PERFORMED - Abnormal; Notable for the following:    Opiates POSITIVE (*)    Benzodiazepines POSITIVE (*)    All other components within normal limits  URINALYSIS, ROUTINE W REFLEX MICROSCOPIC - Abnormal; Notable for the following:    Color, Urine STRAW (*)    All other components within normal limits  I-STAT CHEM 8, ED - Abnormal; Notable for the following:    Chloride 98 (*)    Calcium, Ion 1.13 (*)    All other components within normal limits    ETHANOL  PROTIME-INR  I-STAT TROPOININ, ED  CBG MONITORING, ED    EKG  EKG Interpretation  Date/Time:  Wednesday October 18 2016 11:00:23 EST Ventricular Rate:  80 PR Interval:  176 QRS Duration: 76 QT Interval:  380 QTC Calculation: 438 R Axis:   58 Text Interpretation:  Normal sinus rhythm Normal ECG Since last tracing rate slower Confirmed by Mayank Teuscher  MD, Saraih Lorton ([id number]) on 10/18/2016 11:35:20 AM       Radiology Ct Head Wo Contrast  Result Date: 10/18/2016 CLINICAL DATA:  Acute onset of confusion, altered mental status EXAM: CT HEAD WITHOUT CONTRAST TECHNIQUE: Contiguous axial images were obtained from the base of the skull through the vertex without intravenous contrast. COMPARISON:  02/03/2015 FINDINGS: Brain: No intracranial hemorrhage, mass effect or midline shift. There are motion artifacts. Stable moderate cerebral atrophy. Again noted extensive periventricular and patchy subcortical white matter decreased attenuation consistent with chronic small vessel ischemic changes. No definite acute cortical infarction. No mass lesion is noted on this unenhanced scan. Ventricular size is stable from prior exam. Vascular: Mild atherosclerotic calcifications of carotid siphon again noted Skull: No skull fracture is noted. Sinuses/Orbits: No acute findings. No paranasal sinuses air-fluid levels. Other: None IMPRESSION: No acute intracranial abnormality. No definite acute cortical infarction. Stable cerebral atrophy. Again noted extensive periventricular and patchy subcortical chronic white matter disease. No definite acute cortical infarction. Motion artifacts are noted. No skull fracture is identified. Electronically Signed   By: Natasha Mead M.D.   On: 10/18/2016 12:50    Procedures Procedures (including critical care time)  Medications Ordered in ED Medications  labetalol (NORMODYNE,TRANDATE) injection 10 mg (10 mg Intravenous Given 10/18/16 1516)     Initial Impression / Assessment  and Plan / ED Course  I have reviewed the triage vital signs and the nursing notes.  Pertinent labs & imaging results that were available during my care of the patient were reviewed by me and considered in my medical decision making (see chart for details).   At this time the patient has an abnormal presentation with altered mental status. There is no physical findings to suggest a source of this.  She has no abdominal tenderness, no tachycardia, no cranial nerve deficits or other neurologic findings. We'll obtain a CT scan of the brain as well as labs, cardiac testing and a urinalysis to evaluate for other sources of altered mental status.  Pt has unremarkable labs BP elevated - Labetalol given D/w hospitalist and Neuro - suggested MRI and EEG overnight Improved mental status through ED stay.  Final Clinical Impressions(s) / ED Diagnoses   Final diagnoses:  Altered mental status, unspecified altered mental status type    New Prescriptions New Prescriptions   No medications on file     Eber Hong, MD 10/18/16 1527

## 2016-10-18 NOTE — Progress Notes (Signed)
Nurse called to advise MRI came to get patient. Will do EEG tomorrow as schedule permits.

## 2016-10-18 NOTE — H&P (Signed)
History and Physical    Nicole Key VWU:[id number] DOB: [birth year] DOA: 10/18/2016  PCP: [name], MD Patient coming from: home  Chief Complaint: acute encephalopathy  HPI: Nicole Key is a  83 y.o. female with medical history significant for chronic abdominal pain, anxiety, depression, hypertension, GERD. Mnire's disease, severe protein calorie malnutrition, unstable gait presents to the emergency department with chief complaint of acute encephalopathy and chest pain.  Symptoms quickly improved some being admitted for chest pain rule out and MRI and EEG  Information is obtained from the patient and grandson lives with the patient and serves as caregiver. Patient was in her usual state of health however she is under a lot of stress due to the recent demise of close family members as well as other family members hospitalized with severe illness awakened this morning and family noticed she was not herself. Her baseline reportedly is alert and oriented 4. She does use a walker and suffered a fall last week without injury. Grandson reports last night she complained of upper epigastric left-sided chest pain. He also reports this is been somewhat chronic and Dr. Lorain Childes has been "working on this for 3 years". Patient had difficulty getting to bedside Commode this morning. She endorses generalized weakness and fatigue. She denies headache visual disturbances syncope or near-syncope. She denies numbness tingling of her extremities difficulty chewing or swallowing. She does complain of some left anterior nonradiating chest pain. She describes it as sharp intermittent. She states nothing makes it better or worse. She denies nausea vomiting dysuria hematuria frequency or urgency. She denies diarrhea constipation melena or bright red blood per rectum. She denies lower extremity edema but does report a chronic wet sounding yet nonproductive cough denies fever chills recent sick contacts or  travel   ED Course: In the emergency department she is alert and oriented to self and place. She is afebrile hemodynamically stable and not hypoxic.  Review of Systems: As per HPI otherwise 10 point review of systems negative.   Ambulatory Status: She ambulates with a walker with an unsteady gait minimal assist with ADLs  Past Medical History:  Diagnosis Date  . Anxiety   . Arthritis   . Fibromyalgia   . GERD (gastroesophageal reflux disease)   . Hypertension   . Insomnia   . Meniere's disease 1989  . Vertigo     Past Surgical History:  Procedure Laterality Date  . ABDOMINAL HYSTERECTOMY    . APPENDECTOMY    . BILATERAL TOTAL MASTECTOMY WITH AXILLARY LYMPH NODE DISSECTION  1981    Social History   Social History  . Marital status: Widowed    Spouse name: N/A  . Number of children: N/A  . Years of education: N/A   Occupational History  . Not on file.   Social History Main Topics  . Smoking status: Never Smoker  . Smokeless tobacco: Never Used  . Alcohol use No     Comment: denies alcohol use  . Drug use: No  . Sexual activity: No   Other Topics Concern  . Not on file   Social History Narrative  . No narrative on file  She lives at home and her grandson lives with her he is a primary caregiver however he states he does not help her with her meds  Allergies  Allergen Reactions  . Shellfish Allergy Other (See Comments)    unknown    Family History  Problem Relation Age of Onset  . Aortic aneurysm Mother  Had ruptured aorta  . Lung cancer Father   . Heart disease Father     Prior to Admission medications   Medication Sig Start Date End Date Taking? Authorizing Provider  metoprolol tartrate (LOPRESSOR) 25 MG tablet Take 0.5 tablets (12.5 mg total) by mouth 2 (two) times daily. 02/09/15  Yes Jerald KiefStephen K Chiu, MD  ALPRAZolam Prudy Feeler(XANAX) 0.25 MG tablet Take 0.25 mg by mouth 2 (two) times daily as needed (anxiety).  08/09/13   Historical Provider, MD  Alum &  Mag Hydroxide-Simeth (MAGIC MOUTHWASH W/LIDOCAINE) SOLN Take 5 mLs by mouth 4 (four) times daily -  with meals and at bedtime. 02/09/15   Jerald KiefStephen K Chiu, MD  amLODipine (NORVASC) 5 MG tablet Take 1 tablet (5 mg total) by mouth daily. 08/18/13   Geoffry Paradiseichard Aronson, MD  benzonatate (TESSALON) 100 MG capsule Take 1 capsule (100 mg total) by mouth 3 (three) times daily as needed for cough. 02/09/15   Jerald KiefStephen K Chiu, MD  feeding supplement, ENSURE ENLIVE, (ENSURE ENLIVE) LIQD Take 237 mLs by mouth 3 (three) times daily between meals. 02/09/15   Jerald KiefStephen K Chiu, MD  HYDROcodone-acetaminophen (NORCO/VICODIN) 5-325 MG per tablet Take 1 tablet by mouth every 6 (six) hours as needed for moderate pain.    Historical Provider, MD  HYDROcodone-acetaminophen (NORCO/VICODIN) 5-325 MG per tablet Take 1 tablet by mouth every 6 (six) hours as needed for moderate pain. 02/09/15   Jerald KiefStephen K Chiu, MD  pantoprazole (PROTONIX) 40 MG tablet Take 1 tablet (40 mg total) by mouth 2 (two) times daily before a meal. 02/09/15   Jerald KiefStephen K Chiu, MD  promethazine (PHENERGAN) 25 MG tablet Take 25 mg by mouth every 8 (eight) hours as needed for nausea or vomiting.    Historical Provider, MD  traMADol (ULTRAM) 50 MG tablet Take 1 tablet (50 mg total) by mouth every 6 (six) hours as needed (pain). Maximum dose= 8 tablets per day as needed for pain 02/09/15   Jerald KiefStephen K Chiu, MD  traZODone (DESYREL) 50 MG tablet Take 1 tablet (50 mg total) by mouth at bedtime as needed for sleep. 02/09/15   Jerald KiefStephen K Chiu, MD    Physical Exam: Vitals:   10/18/16 1500 10/18/16 1515 10/18/16 1521 10/18/16 1530  BP: (!) 202/96 191/88 151/82 170/84  Pulse: 80 82 89 86  Resp: 16 20 22 19   Temp:      TempSrc:      SpO2: 100% 97% 97% 96%     General:  Appears calm . Thin and frail appearing somewhat chronically ill Eyes:  PERRL, EOMI, normal lids, iris ENT:  grossly normal hearing, lips & tongue, his membranes of her mouth are slightly pale slightly dry Neck:   no LAD, masses or thyromegaly Cardiovascular:  RRR, no m/r/g. No LE edema. Pulses present and palpable Respiratory:  No Increased work of breathing. Respirations somewhat shallow. Breath sounds are distant Abdomen:  soft, ntnd, positive bowel sounds throughout no guarding or rebounding Skin:  no rash or induration seen on limited exam Musculoskeletal:  grossly normal tone BUE/BLE, good ROM, no bony abnormality Psychiatric:  grossly normal mood and affect, speech fluent and appropriate, AOx3 Neurologic:  Alert and oriented to person and place and time. She is able to follow commands albeit slowly speech is slow and deliberate but clear. Cranial nerves II through XII grossly intact. Bilateral grip 4/5. LE strength 5/5 bilaterally  Labs on Admission: I have personally reviewed following labs and imaging studies  CBC:  Recent Labs Lab  10/18/16 1210 10/18/16 1224  WBC 13.0*  --   NEUTROABS 9.2*  --   HGB 12.0 13.3  HCT 37.9 39.0  MCV 95.2  --   PLT 408*  --    Basic Metabolic Panel:  Recent Labs Lab 10/18/16 1210 10/18/16 1224  NA 141 142  K 4.6 4.5  CL 101 98*  CO2 33*  --   GLUCOSE 100* 98  BUN 11 15  CREATININE 0.75 0.80  CALCIUM 9.6  --    GFR: CrCl cannot be calculated (Unknown ideal weight.). Liver Function Tests:  Recent Labs Lab 10/18/16 1210  AST 20  ALT 12*  ALKPHOS 63  BILITOT 0.5  PROT 7.6  ALBUMIN 3.6   No results for input(s): LIPASE, AMYLASE in the last 168 hours. No results for input(s): AMMONIA in the last 168 hours. Coagulation Profile:  Recent Labs Lab 10/18/16 1210  INR 1.03   Cardiac Enzymes: No results for input(s): CKTOTAL, CKMB, CKMBINDEX, TROPONINI in the last 168 hours. BNP (last 3 results) No results for input(s): PROBNP in the last 8760 hours. HbA1C: No results for input(s): HGBA1C in the last 72 hours. CBG: No results for input(s): GLUCAP in the last 168 hours. Lipid Profile: No results for input(s): CHOL, HDL, LDLCALC,  TRIG, CHOLHDL, LDLDIRECT in the last 72 hours. Thyroid Function Tests: No results for input(s): TSH, T4TOTAL, FREET4, T3FREE, THYROIDAB in the last 72 hours. Anemia Panel: No results for input(s): VITAMINB12, FOLATE, FERRITIN, TIBC, IRON, RETICCTPCT in the last 72 hours. Urine analysis:    Component Value Date/Time   COLORURINE STRAW (A) 10/18/2016 1152   APPEARANCEUR CLEAR 10/18/2016 1152   LABSPEC 1.008 10/18/2016 1152   PHURINE 6.0 10/18/2016 1152   GLUCOSEU NEGATIVE 10/18/2016 1152   HGBUR NEGATIVE 10/18/2016 1152   BILIRUBINUR NEGATIVE 10/18/2016 1152   KETONESUR NEGATIVE 10/18/2016 1152   PROTEINUR NEGATIVE 10/18/2016 1152   UROBILINOGEN 0.2 02/03/2015 1928   NITRITE NEGATIVE 10/18/2016 1152   LEUKOCYTESUR NEGATIVE 10/18/2016 1152    Creatinine Clearance: CrCl cannot be calculated (Unknown ideal weight.).  Sepsis Labs: @LABRCNTIP (procalcitonin:4,lacticidven:4) )No results found for this or any previous visit (from the past 240 hour(s)).   Radiological Exams on Admission: Ct Head Wo Contrast  Result Date: 10/18/2016 CLINICAL DATA:  Acute onset of confusion, altered mental status EXAM: CT HEAD WITHOUT CONTRAST TECHNIQUE: Contiguous axial images were obtained from the base of the skull through the vertex without intravenous contrast. COMPARISON:  02/03/2015 FINDINGS: Brain: No intracranial hemorrhage, mass effect or midline shift. There are motion artifacts. Stable moderate cerebral atrophy. Again noted extensive periventricular and patchy subcortical white matter decreased attenuation consistent with chronic small vessel ischemic changes. No definite acute cortical infarction. No mass lesion is noted on this unenhanced scan. Ventricular size is stable from prior exam. Vascular: Mild atherosclerotic calcifications of carotid siphon again noted Skull: No skull fracture is noted. Sinuses/Orbits: No acute findings. No paranasal sinuses air-fluid levels. Other: None IMPRESSION: No  acute intracranial abnormality. No definite acute cortical infarction. Stable cerebral atrophy. Again noted extensive periventricular and patchy subcortical chronic white matter disease. No definite acute cortical infarction. Motion artifacts are noted. No skull fracture is identified. Electronically Signed   By: Natasha Mead M.D.   On: 10/18/2016 12:50   Portable Chest 1 View  Result Date: 10/18/2016 CLINICAL DATA:  Mid chest pain this morning, lethargy. History of hypertension, aspiration pneumonia, respiratory failure, and gastroesophageal reflux nonsmoker. EXAM: PORTABLE CHEST 1 VIEW COMPARISON:  PA and lateral chest x-ray  of February 06, 2015 FINDINGS: The lungs are well-expanded. There is no focal infiltrate. There is no pleural effusion. There is a hiatal hernia which is distended with gas. The heart is normal in size. The pulmonary vascularity is not engorged. There is calcification in the wall of the aortic arch. At the bony thorax exhibits no acute abnormality. IMPRESSION: Gaseous distention of a hiatal hernia may be responsible for the patient's symptoms. There is no pneumonia nor CHF nor other acute cardiopulmonary abnormality. Thoracic aortic atherosclerosis. Electronically Signed   By: David  [place] M.D.   On: 10/18/2016 15:57    EKG: Independently reviewed. Normal sinus rhythm Normal ECG Since last tracing rate slower  Assessment/Plan Principal Problem:   Acute encephalopathy Active Problems:   Meniere's disease   Anxiety   GERD (gastroesophageal reflux disease)   Protein-calorie malnutrition, severe (HCC)   Chest pain   Abdominal pain   #1. Acute encephalopathy. Etiology uncertain. CT of the head acute intracranial abnormality. No signs of infection at this point. No metabolic derangements. Focal deficits neuro exam benign at the point of admission. Urine drug screen positive for benzos and opiates. Symptoms much improved. ED provider discussed with neuro hospitalists who recommended  MRI and EEG and formal consult requested pending on results -Admit to telemetry -Obtain an MRI -EEG -B 12, folate, rpr -gently IV fluids -decrease opiate and xanax -discontinue phenergan -monitor -neuro consult tomorrow depending on results of MRI/EEG  #2. Abdominal pain/chest pain. History of GERD. Patient complains of epigastric left anterior chest pain. Grandson reports this is chronic and Dr. Jacky Kindle been working on it for "3 years". Initial troponin is negative. EKG without acute changes. -monitor on tele -trend troponin -serial EKG -gi cocktail -supportive therapy -check lipase  #3. Hypertension. Fair control in the emergency department. He was provided labetalol in the emergency department. Home medications inlcude metroprolol  -Continue low-dose metoprolol with parameters -monitor  #4. Anxiety. Appears stable at baseline. Home medications include Xanax -We will minimize San access noted above  #5. GERD. Continue home meds   DVT prophylaxis: lovenox  Code Status:  full Family Communication: grandson  Disposition Plan: home  Consults called: none  Admission status: obs    Toya Smothers M MD [facility]  If 7PM-7AM, please contact night-coverage [email or web address] Password [facility]  10/18/2016, 4:19 PM

## 2016-10-18 NOTE — Progress Notes (Signed)
Report received Dustin, RN, Amalia Haileywill await for pt. To come to 5W20.  [employer] Cellarelcine Javaun Dimperio, RN

## 2016-10-18 NOTE — ED Triage Notes (Signed)
Per pts son pt lives at home & has a caregiver, pt reported to be confused, altered mental status & baseline A&O x 4, pt reported by caregiver to c/o mid CP, pt denies CP in triage, pt A&O x4 in triage, family reports acute onset confusion x 30 mins ago, pt returned to baseline, pt c/o lower bil abd pain when asked about pain, pt follows commands, A&O x4

## 2016-10-19 ENCOUNTER — Observation Stay (HOSPITAL_COMMUNITY): Payer: Medicare Other

## 2016-10-19 DIAGNOSIS — I679 Cerebrovascular disease, unspecified: Secondary | ICD-10-CM

## 2016-10-19 DIAGNOSIS — G9340 Encephalopathy, unspecified: Secondary | ICD-10-CM

## 2016-10-19 LAB — FOLATE RBC
FOLATE, HEMOLYSATE: 577.3 ng/mL
Folate, RBC: 1626 (ref >498)
HEMATOCRIT: 35.5 % (ref 34.0–46.6)

## 2016-10-19 LAB — CBC
HCT: 31.8 % — ABNORMAL LOW (ref 36.0–46.0)
HEMOGLOBIN: 9.9 g/dL — AB (ref 12.0–15.0)
MCH: 29.8 pg (ref 26.0–34.0)
MCHC: 31.4 g/dL (ref 30.0–36.0)
MCV: 94.6 fL (ref 78.0–100.0)
Platelets: 357 10*3/uL (ref 150–400)
RBC: 3.36 MIL/uL — AB (ref 3.87–5.11)
RDW: 14.8 % (ref 11.5–15.5)
WBC: 13.2 10*3/uL — ABNORMAL HIGH (ref 4.0–10.5)

## 2016-10-19 LAB — BASIC METABOLIC PANEL
ANION GAP: 7 (ref 5–15)
BUN: 9 mg/dL (ref 6–20)
CHLORIDE: 103 mmol/L (ref 101–111)
CO2: 29 mmol/L (ref 22–32)
CREATININE: 0.63 mg/dL (ref 0.44–1.00)
Calcium: 8.7 mg/dL — ABNORMAL LOW (ref 8.9–10.3)
GFR calc non Af Amer: >60 mL/min (ref >60)
GLUCOSE: 117 mg/dL — AB (ref 65–99)
Potassium: 4.1 mmol/L (ref 3.5–5.1)
Sodium: 139 mmol/L (ref 135–145)

## 2016-10-19 LAB — RPR: RPR Ser Ql: NONREACTIVE

## 2016-10-19 MED ORDER — ENOXAPARIN SODIUM 30 MG/0.3ML ~~LOC~~ SOLN: 30.0000 mg | SUBCUTANEOUS | Status: DC

## 2016-10-19 MED ORDER — METOPROLOL TARTRATE 25 MG PO TABS: 25.0000 mg | ORAL_TABLET | Freq: Two times a day (BID) | ORAL | Status: DC

## 2016-10-19 MED ORDER — ALPRAZOLAM 0.25 MG PO TABS: 0.2500 mg | ORAL_TABLET | Freq: Every evening | ORAL | 0 refills | Status: DC | PRN

## 2016-10-19 MED ORDER — MECLIZINE HCL 25 MG PO TABS: 12.5000 mg | ORAL_TABLET | Freq: Three times a day (TID) | ORAL | Status: DC | PRN

## 2016-10-19 NOTE — Discharge Summary (Addendum)
Physician Discharge Summary  Nicole Key:[id number] DOB: April 15, 1928 DOA: 10/18/2016  PCP: [name], MD  Admit date: 10/18/2016 Discharge date: 10/19/2016   Recommendations for Outpatient Follow-Up:   1. Family to be involved in medication management 2. Home health- 24 hour supervision by family 3. Need to minimize sedating meds 4. Reduce stress as able- multiple family members sick 5. CBC to follow WBC    Discharge Diagnosis:   Principal Problem:   Acute encephalopathy Active Problems:   Meniere's disease   Anxiety   GERD (gastroesophageal reflux disease)   Protein-calorie malnutrition, severe (HCC)   Chest pain   Abdominal pain   Discharge disposition:  Home.  SNF:  Discharge Condition: Improved.  Diet recommendation: Regular.  Wound care: None.   History of Present Illness:    Nicole Key is a  83 y.o. female with medical history significant for chronic abdominal pain, anxiety, depression, hypertension, GERD. Mnire's disease, severe protein calorie malnutrition, unstable gait presents to the emergency department with chief complaint of acute encephalopathy and chest pain.  Symptoms quickly improved some being admitted for chest pain rule out and MRI and EEG  Information is obtained from the patient and grandson lives with the patient and serves as caregiver. Patient was in her usual state of health however she is under a lot of stress due to the recent demise of close family members as well as other family members hospitalized with severe illness awakened this morning and family noticed she was not herself. Her baseline reportedly is alert and oriented 4. She does use a walker and suffered a fall last week without injury. Grandson reports last night she complained of upper epigastric left-sided chest pain. He also reports this is been somewhat chronic and Dr. Lorain Childes has been "working on this for 3 years". Patient had difficulty getting to  bedside Commode this morning. She endorses generalized weakness and fatigue. She denies headache visual disturbances syncope or near-syncope. She denies numbness tingling of her extremities difficulty chewing or swallowing. She does complain of some left anterior nonradiating chest pain. She describes it as sharp intermittent. She states nothing makes it better or worse. She denies nausea vomiting dysuria hematuria frequency or urgency. She denies diarrhea constipation melena or bright red blood per rectum. She denies lower extremity edema but does report a chronic wet sounding yet nonproductive cough denies fever chills recent sick contacts or travel   Hospital Course by Problem:   Acute encephalopathy is probably multifactorial. Polypharmacy may be a significant contributor given several psychoactive meds--opiates, benzos, meclizine, phenergan, and trazodone would best be avoided in this patient given her age and her underlying poor cerebral reserve. Somatization cannot be excluded given report of substantial stress. Labs are largely unrevealing for obvious metabolic cause. EEG is normal and no clear evidence of seizure was reported. MRI shows no acute structural pathology that would explain her presentation. She has improved.    Medical Consultants:    Neuro   Discharge Exam:   Vitals:   10/18/16 2124 10/19/16 0619  BP: 132/69 (!) 145/72  Pulse: 87 86  Resp:    Temp: 98.4 F (36.9 C) 97.9 F (36.6 C)   Vitals:   10/18/16 2124 10/19/16 0259 10/19/16 0619 10/19/16 1355  BP: 132/69  (!) 145/72   Pulse: 87  86   Resp:      Temp: 98.4 F (36.9 C)  97.9 F (36.6 C)   TempSrc: Oral  Oral   SpO2: 96%  100%   Weight:  45.2 kg (99 lb 9.6 oz)    Height:    5\' 8"  (1.727 m)    Gen:  NAD    The results of significant diagnostics from this hospitalization (including imaging, microbiology, ancillary and laboratory) are listed below for reference.     Procedures and Diagnostic  Studies:   Ct Head Wo Contrast  Result Date: 10/18/2016 CLINICAL DATA:  Acute onset of confusion, altered mental status EXAM: CT HEAD WITHOUT CONTRAST TECHNIQUE: Contiguous axial images were obtained from the base of the skull through the vertex without intravenous contrast. COMPARISON:  02/03/2015 FINDINGS: Brain: No intracranial hemorrhage, mass effect or midline shift. There are motion artifacts. Stable moderate cerebral atrophy. Again noted extensive periventricular and patchy subcortical white matter decreased attenuation consistent with chronic small vessel ischemic changes. No definite acute cortical infarction. No mass lesion is noted on this unenhanced scan. Ventricular size is stable from prior exam. Vascular: Mild atherosclerotic calcifications of carotid siphon again noted Skull: No skull fracture is noted. Sinuses/Orbits: No acute findings. No paranasal sinuses air-fluid levels. Other: None IMPRESSION: No acute intracranial abnormality. No definite acute cortical infarction. Stable cerebral atrophy. Again noted extensive periventricular and patchy subcortical chronic white matter disease. No definite acute cortical infarction. Motion artifacts are noted. No skull fracture is identified. Electronically Signed   By: Natasha MeadLiviu  Pop M.D.   On: 10/18/2016 12:50   Mr Brain Wo Contrast  Result Date: 10/18/2016 CLINICAL DATA:  Altered mental status.  History of dementia. EXAM: MRI HEAD WITHOUT CONTRAST TECHNIQUE: Multiplanar, multiecho pulse sequences of the brain and surrounding structures were obtained without intravenous contrast. COMPARISON:  CT head 10/18/2016 FINDINGS: Brain:  Ventricle size normal.  Mild atrophy, typical for age. Negative for acute infarct. Extensive white matter hyperintensity diffusely most consistent with microvascular ischemia. Brainstem and cerebellum normal. Negative for mass lesion. No edema or shift of the midline structure. Chronic microhemorrhage high left parietal cortex.  No fluid collection. Vascular: Normal arterial flow voids. Skull and upper cervical spine: Negative Sinuses/Orbits: Negative Other: None IMPRESSION: Atrophy and chronic ischemic change in the white matter. No acute intracranial abnormality. Electronically Signed   By: Marlan Palauharles  Clark M.D.   On: 10/18/2016 17:03   Portable Chest 1 View  Result Date: 10/18/2016 CLINICAL DATA:  Mid chest pain this morning, lethargy. History of hypertension, aspiration pneumonia, respiratory failure, and gastroesophageal reflux nonsmoker. EXAM: PORTABLE CHEST 1 VIEW COMPARISON:  PA and lateral chest x-ray of February 06, 2015 FINDINGS: The lungs are well-expanded. There is no focal infiltrate. There is no pleural effusion. There is a hiatal hernia which is distended with gas. The heart is normal in size. The pulmonary vascularity is not engorged. There is calcification in the wall of the aortic arch. At the bony thorax exhibits no acute abnormality. IMPRESSION: Gaseous distention of a hiatal hernia may be responsible for the patient's symptoms. There is no pneumonia nor CHF nor other acute cardiopulmonary abnormality. Thoracic aortic atherosclerosis. Electronically Signed   By: David  SwazilandJordan M.D.   On: 10/18/2016 15:57     Labs:   Basic Metabolic Panel:  Recent Labs Lab 10/18/16 1210 10/18/16 1224 10/19/16 0500  NA 141 142 139  K 4.6 4.5 4.1  CL 101 98* 103  CO2 33*  --  29  GLUCOSE 100* 98 117*  BUN 11 15 9   CREATININE 0.75 0.80 0.63  CALCIUM 9.6  --  8.7*   GFR Estimated Creatinine Clearance: 36 mL/min (by C-G formula based  on SCr of 0.63 mg/dL). Liver Function Tests:  Recent Labs Lab 10/18/16 1210  AST 20  ALT 12*  ALKPHOS 63  BILITOT 0.5  PROT 7.6  ALBUMIN 3.6    Recent Labs Lab 10/18/16 1831  LIPASE 11   No results for input(s): AMMONIA in the last 168 hours. Coagulation profile  Recent Labs Lab 10/18/16 1210  INR 1.03    CBC:  Recent Labs Lab 10/18/16 1210 10/18/16 1224  10/18/16 1831 10/19/16 0500  WBC 13.0*  --   --  13.2*  NEUTROABS 9.2*  --   --   --   HGB 12.0 13.3  --  9.9*  HCT 37.9 39.0 35.5 31.8*  MCV 95.2  --   --  94.6  PLT 408*  --   --  357   Cardiac Enzymes:  Recent Labs Lab 10/18/16 1831 10/18/16 2251  TROPONINI <0.03 <0.03   BNP: Invalid input(s): POCBNP CBG: No results for input(s): GLUCAP in the last 168 hours. D-Dimer No results for input(s): DDIMER in the last 72 hours. Hgb A1c No results for input(s): HGBA1C in the last 72 hours. Lipid Profile No results for input(s): CHOL, HDL, LDLCALC, TRIG, CHOLHDL, LDLDIRECT in the last 72 hours. Thyroid function studies No results for input(s): TSH, T4TOTAL, T3FREE, THYROIDAB in the last 72 hours.  Invalid input(s): FREET3 Anemia work up  Recent Labs  10/18/16 1831  VITAMINB12 405   Microbiology No results found for this or any previous visit (from the past 240 hour(s)).   Discharge Instructions:   Discharge Instructions    Diet general    Complete by:  As directed    Discharge instructions    Complete by:  As directed    Home health PT and 24/care by family Family needs to be involved   Increase activity slowly    Complete by:  As directed      Allergies as of 10/19/2016      Reactions   Shellfish Allergy Other (See Comments)   unknown      Medication List    STOP taking these medications   HYDROcodone-acetaminophen 5-325 MG tablet Commonly known as:  NORCO/VICODIN   promethazine 25 MG tablet Commonly known as:  PHENERGAN     TAKE these medications   ALPRAZolam 0.25 MG tablet Commonly known as:  XANAX Take 1 tablet (0.25 mg total) by mouth at bedtime as needed (anxiety). What changed:  when to take this  reasons to take this   hyoscyamine 0.125 MG tablet Commonly known as:  LEVSIN, ANASPAZ Take 0.125 mg by mouth every 4 (four) hours as needed for bladder spasms or cramping.   meclizine 25 MG tablet Commonly known as:  ANTIVERT Take 25 mg  by mouth 3 (three) times daily as needed for dizziness.   metoprolol tartrate 25 MG tablet Commonly known as:  LOPRESSOR Take 1 tablet (25 mg total) by mouth 2 (two) times daily.   pantoprazole 40 MG tablet Commonly known as:  PROTONIX Take 1 tablet (40 mg total) by mouth 2 (two) times daily before a meal.   traZODone 50 MG tablet Commonly known as:  DESYREL Take 1 tablet (50 mg total) by mouth at bedtime as needed for sleep.      Follow-up Information    ARONSON,RICHARD A, MD Follow up in 1 week(s).   Specialty:  Internal Medicine Contact information: [address] [place] [address] [phone]            Time  coordinating discharge: 33 min  Signed:  Gordana Kewley U Nalleli Largent   [facility] 10/19/2016, 2:25 PM

## 2016-10-19 NOTE — Consult Note (Signed)
NEURO HOSPITALIST CONSULT NOTE   Requestig physician: Dr. Benjamine Mola   Reason for Consult: transient AMS   History obtained from:  Patient  And Chart    HPI:                                                                                                                                          Nicole Key is an 83 y.o. female with medical history significant for chronic abdominal pain, anxiety, depression, hypertension, GERD. Mnire's disease, severe protein calorie malnutrition, unstable gait presents to the emergency department with chief complaint of acute encephalopathy and chest pain.  Symptoms quickly improved While in the emergency department. Currently patient is obtaining EEG which on formally read shows no epileptiform activity and is normal.  Per chart, "Information is obtained from the patient and grandson lives with the patient and serves as caregiver. Patient was in her usual state of health however she is under a lot of stress due to the recent demise of close family members as well as other family members hospitalized with severe illness awakened this morning and family noticed she was not herself. Her baseline reportedly is alert and oriented 4. She does use a walker and suffered a fall last week without injury."    At current time patient is alert and oriented she is able to tell me where she is, but does not recall the events that brought her to the hospital. She is aware that her son was telling her that she was not acting her normal self.  Past Medical History:  Diagnosis Date  . Anxiety   . Arthritis   . Fibromyalgia   . GERD (gastroesophageal reflux disease)   . Hypertension   . Insomnia   . Meniere's disease 1989  . Vertigo     Past Surgical History:  Procedure Laterality Date  . ABDOMINAL HYSTERECTOMY    . APPENDECTOMY    . BILATERAL TOTAL MASTECTOMY WITH AXILLARY LYMPH NODE DISSECTION  1981    Family History  Problem Relation Age of  Onset  . Aortic aneurysm Mother     Had ruptured aorta  . Lung cancer Father   . Heart disease Father       Social History:  reports that she has never smoked. She has never used smokeless tobacco. She reports that she does not drink alcohol or use drugs.  Allergies  Allergen Reactions  . Shellfish Allergy Other (See Comments)    unknown    MEDICATIONS:  Prior to Admission:  Prescriptions Prior to Admission  Medication Sig Dispense Refill Last Dose  . ALPRAZolam (XANAX) 0.25 MG tablet Take 0.25 mg by mouth at bedtime.    10/17/2016 at Unknown time  . HYDROcodone-acetaminophen (NORCO/VICODIN) 5-325 MG per tablet Take 1 tablet by mouth every 6 (six) hours as needed for moderate pain. 30 tablet 0 10/18/2016 at Unknown time  . hyoscyamine (LEVSIN, ANASPAZ) 0.125 MG tablet Take 0.125 mg by mouth every 4 (four) hours as needed for bladder spasms or cramping.   10/18/2016 at Unknown time  . meclizine (ANTIVERT) 25 MG tablet Take 25 mg by mouth 3 (three) times daily as needed for dizziness.   10/17/2016 at Unknown time  . metoprolol tartrate (LOPRESSOR) 25 MG tablet Take 0.5 tablets (12.5 mg total) by mouth 2 (two) times daily. (Patient taking differently: Take 25 mg by mouth 2 (two) times daily. ) 60 tablet 0 10/18/2016 at Unknown time  . pantoprazole (PROTONIX) 40 MG tablet Take 1 tablet (40 mg total) by mouth 2 (two) times daily before a meal. 30 tablet 0 10/18/2016 at Unknown time  . promethazine (PHENERGAN) 25 MG tablet Take 25 mg by mouth every 8 (eight) hours as needed for nausea or vomiting.   10/17/2016 at Unknown time  . traZODone (DESYREL) 50 MG tablet Take 1 tablet (50 mg total) by mouth at bedtime as needed for sleep. 30 tablet 0 Past Week at Unknown time   Scheduled: . enoxaparin (LOVENOX) injection  40 mg Subcutaneous Q24H  . feeding supplement (ENSURE ENLIVE)  237 mL  Oral TID BM  . magic mouthwash w/lidocaine  5 mL Oral TID WC & HS  . metoprolol tartrate  12.5 mg Oral BID  . pantoprazole  40 mg Oral BID AC  . sodium chloride flush  3 mL Intravenous Q12H     ROS:                                                                                                                                       History obtained from the patient  General ROS: negative for - chills, fatigue, fever, night sweats, weight gain or weight loss Psychological ROS: negative for - behavioral disorder, hallucinations, memory difficulties, mood swings or suicidal ideation Ophthalmic ROS: negative for - blurry vision, double vision, eye pain or loss of vision ENT ROS: negative for - epistaxis, nasal discharge, oral lesions, sore throat, tinnitus or vertigo Allergy and Immunology ROS: negative for - hives or itchy/watery eyes Hematological and Lymphatic ROS: negative for - bleeding problems, bruising or swollen lymph nodes Endocrine ROS: negative for - galactorrhea, hair pattern changes, polydipsia/polyuria or temperature intolerance Respiratory ROS: negative for - cough, hemoptysis, shortness of breath or wheezing Cardiovascular ROS: negative for - chest pain, dyspnea on exertion, edema or irregular heartbeat Gastrointestinal ROS: negative for - abdominal pain, diarrhea, hematemesis, nausea/vomiting or stool incontinence Genito-Urinary ROS: negative for -  dysuria, hematuria, incontinence or urinary frequency/urgency Musculoskeletal ROS: negative for - joint swelling or muscular weakness Neurological ROS: as noted in HPI Dermatological ROS: negative for rash and skin lesion changes   Blood pressure (!) 145/72, pulse 86, temperature 97.9 F (36.6 C), temperature source Oral, resp. rate 17, weight 45.2 kg (99 lb 9.6 oz), SpO2 100 %.   Neurologic Examination:                                                                                                      HEENT-  Normocephalic,  no lesions, without obvious abnormality.  Normal external eye and conjunctiva.  Normal TM's bilaterally.  Normal auditory canals and external ears. Normal external nose, mucus membranes and septum.  Normal pharynx. Cardiovascular- S1, S2 normal, pulses palpable throughout   Lungs- chest clear, no wheezing, rales, normal symmetric air entry Abdomen- normal findings: bowel sounds normal Extremities- no edema Lymph-no adenopathy palpable Musculoskeletal-no joint tenderness, deformity or swelling Skin-warm and dry, no hyperpigmentation, vitiligo, or suspicious lesions  Neurological Examination Mental Status: Alert, oriented, thought content appropriate.  Speech fluent without evidence of aphasia.  Able to follow 3 step commands without difficulty. Cranial Nerves: II:  Visual fields grossly normal, pupils equal, round, reactive to light and accommodation III,IV, VI: ptosis not present, extra-ocular motions intact bilaterally V,VII: smile symmetric, facial light touch sensation normal bilaterally VIII: hearing normal bilaterally IX,X: uvula rises symmetrically XI: bilateral shoulder shrug XII: midline tongue extension Motor: Right : Upper extremity   5/5    Left:     Upper extremity   5/5  Lower extremity   5/5     Lower extremity   5/5 Decreased bulk throughout Sensory: Pinprick and light touch intact throughout, bilaterally Deep Tendon Reflexes: 2+ and symmetric throughout Plantars: Right: downgoing   Left: downgoing Cerebellar: normal finger-to-nose with end-stage positional tremor,  Gait: Not tested      Lab Results: Basic Metabolic Panel:  Recent Labs Lab 10/18/16 1210 10/18/16 1224 10/19/16 0500  NA 141 142 139  K 4.6 4.5 4.1  CL 101 98* 103  CO2 33*  --  29  GLUCOSE 100* 98 117*  BUN 11 15 9   CREATININE 0.75 0.80 0.63  CALCIUM 9.6  --  8.7*    Liver Function Tests:  Recent Labs Lab 10/18/16 1210  AST 20  ALT 12*  ALKPHOS 63  BILITOT 0.5  PROT 7.6   ALBUMIN 3.6    Recent Labs Lab 10/18/16 1831  LIPASE 11   No results for input(s): AMMONIA in the last 168 hours.  CBC:  Recent Labs Lab 10/18/16 1210 10/18/16 1224 10/19/16 0500  WBC 13.0*  --  13.2*  NEUTROABS 9.2*  --   --   HGB 12.0 13.3 9.9*  HCT 37.9 39.0 31.8*  MCV 95.2  --  94.6  PLT 408*  --  357    Cardiac Enzymes:  Recent Labs Lab 10/18/16 1831 10/18/16 2251  TROPONINI <0.03 <0.03    Lipid Panel: No results for input(s): CHOL, TRIG, HDL, CHOLHDL, VLDL, LDLCALC in the last  168 hours.  CBG: No results for input(s): GLUCAP in the last 168 hours.  Microbiology: Results for orders placed or performed during the hospital encounter of 02/03/15  Urine culture     Status: None   Collection Time: 02/03/15  7:28 PM  Result Value Ref Range Status   Specimen Description URINE, RANDOM  Final   Special Requests NONE  Final   Colony Count NO GROWTH Performed at [facility]   Final   Culture NO GROWTH Performed at [facility]   Final   Report Status 02/04/2015 FINAL  Final  Culture, blood (routine x 2)     Status: None   Collection Time: 02/04/15  4:04 AM  Result Value Ref Range Status   Specimen Description RIGHT ANTECUBITAL  Final   Special Requests BOTTLES DRAWN AEROBIC AND ANAEROBIC  Final   Culture   Final    NO GROWTH 5 DAYS Performed at [facility]    Report Status 02/10/2015 FINAL  Final  Culture, blood (routine x 2)     Status: None   Collection Time: 02/04/15  4:04 AM  Result Value Ref Range Status   Specimen Description BLOOD RIGHT HAND  Final   Special Requests BOTTLES DRAWN AEROBIC AND ANAEROBIC  Final   Culture   Final    NO GROWTH 5 DAYS Performed at [facility]    Report Status 02/10/2015 FINAL  Final  Respiratory virus panel     Status: None   Collection Time: 02/04/15  8:00 AM  Result Value Ref Range Status   Source - RVPAN NASOPHARYNGEAL SWAB  Corrected   Respiratory Syncytial Virus A  Negative Negative Final   Respiratory Syncytial Virus B Negative Negative Final   Influenza A Negative Negative Final   Influenza B Negative Negative Final   Parainfluenza 1 Negative Negative Final   Parainfluenza 2 Negative Negative Final   Parainfluenza 3 Negative Negative Final   Metapneumovirus Negative Negative Final   Rhinovirus Negative Negative Final   Adenovirus Negative Negative Final    Comment: (NOTE) Performed At: [facility] [address] [place], [place] [id number] Mila Homer MD QQ:[id number]   Culture, respiratory (NON-Expectorated)     Status: None   Collection Time: 02/04/15  8:20 AM  Result Value Ref Range Status   Specimen Description SPUTUM  Final   Special Requests NONE  Final   Gram Stain   Final    RARE WBC PRESENT,BOTH PMN AND MONONUCLEAR RARE SQUAMOUS EPITHELIAL CELLS PRESENT MODERATE GRAM NEGATIVE RODS FEW GRAM POSITIVE COCCI IN PAIRS IN CLUSTERS RARE YEAST    Culture   Final    MODERATE PSEUDOMONAS AERUGINOSA Performed at [facility]    Report Status 02/07/2015 FINAL  Final   Organism ID, Bacteria PSEUDOMONAS AERUGINOSA  Final      Susceptibility   Pseudomonas aeruginosa - MIC*    CEFEPIME 4 SENSITIVE Sensitive     CEFTAZIDIME 4 SENSITIVE Sensitive     CIPROFLOXACIN <=0.25 SENSITIVE Sensitive     GENTAMICIN 2 SENSITIVE Sensitive     IMIPENEM 2 SENSITIVE Sensitive     PIP/TAZO 8 SENSITIVE Sensitive     TOBRAMYCIN <=1 SENSITIVE Sensitive     * MODERATE PSEUDOMONAS AERUGINOSA  Culture, sputum-assessment     Status: None   Collection Time: 02/04/15  8:30 AM  Result Value Ref Range Status   Specimen Description SPUTUM  Final   Special Requests NONE  Final   Sputum evaluation  Final    THIS SPECIMEN IS ACCEPTABLE. RESPIRATORY CULTURE REPORT TO FOLLOW.   Report Status 02/04/2015 FINAL  Final    Coagulation Studies:  Recent Labs  10/18/16 1210  LABPROT 13.5  INR 1.03    Imaging: Ct Head Wo Contrast  Result  Date: 10/18/2016 CLINICAL DATA:  Acute onset of confusion, altered mental status EXAM: CT HEAD WITHOUT CONTRAST TECHNIQUE: Contiguous axial images were obtained from the base of the skull through the vertex without intravenous contrast. COMPARISON:  02/03/2015 FINDINGS: Brain: No intracranial hemorrhage, mass effect or midline shift. There are motion artifacts. Stable moderate cerebral atrophy. Again noted extensive periventricular and patchy subcortical white matter decreased attenuation consistent with chronic small vessel ischemic changes. No definite acute cortical infarction. No mass lesion is noted on this unenhanced scan. Ventricular size is stable from prior exam. Vascular: Mild atherosclerotic calcifications of carotid siphon again noted Skull: No skull fracture is noted. Sinuses/Orbits: No acute findings. No paranasal sinuses air-fluid levels. Other: None IMPRESSION: No acute intracranial abnormality. No definite acute cortical infarction. Stable cerebral atrophy. Again noted extensive periventricular and patchy subcortical chronic white matter disease. No definite acute cortical infarction. Motion artifacts are noted. No skull fracture is identified. Electronically Signed   By: Natasha Mead M.D.   On: 10/18/2016 12:50   Mr Brain Wo Contrast  Result Date: 10/18/2016 CLINICAL DATA:  Altered mental status.  History of dementia. EXAM: MRI HEAD WITHOUT CONTRAST TECHNIQUE: Multiplanar, multiecho pulse sequences of the brain and surrounding structures were obtained without intravenous contrast. COMPARISON:  CT head 10/18/2016 FINDINGS: Brain:  Ventricle size normal.  Mild atrophy, typical for age. Negative for acute infarct. Extensive white matter hyperintensity diffusely most consistent with microvascular ischemia. Brainstem and cerebellum normal. Negative for mass lesion. No edema or shift of the midline structure. Chronic microhemorrhage high left parietal cortex. No fluid collection. Vascular: Normal  arterial flow voids. Skull and upper cervical spine: Negative Sinuses/Orbits: Negative Other: None IMPRESSION: Atrophy and chronic ischemic change in the white matter. No acute intracranial abnormality. Electronically Signed   By: Marlan Palau M.D.   On: 10/18/2016 17:03   Portable Chest 1 View  Result Date: 10/18/2016 CLINICAL DATA:  Mid chest pain this morning, lethargy. History of hypertension, aspiration pneumonia, respiratory failure, and gastroesophageal reflux nonsmoker. EXAM: PORTABLE CHEST 1 VIEW COMPARISON:  PA and lateral chest x-ray of February 06, 2015 FINDINGS: The lungs are well-expanded. There is no focal infiltrate. There is no pleural effusion. There is a hiatal hernia which is distended with gas. The heart is normal in size. The pulmonary vascularity is not engorged. There is calcification in the wall of the aortic arch. At the bony thorax exhibits no acute abnormality. IMPRESSION: Gaseous distention of a hiatal hernia may be responsible for the patient's symptoms. There is no pneumonia nor CHF nor other acute cardiopulmonary abnormality. Thoracic aortic atherosclerosis. Electronically Signed   By: David  [place] M.D.   On: 10/18/2016 15:57       Assessment and plan per attending neurologist  Felicie Morn PA-C [facility] [phone]  10/19/2016, 9:40 AM   Assessment/Plan: This is an 83 year old female presenting to the emergency department with transient altered mental status. Patient is on a significant amount of sedating medications including: Hydrocodone, Xanax, Phenergan, trazodone, and meclizine. In addition patient has had multiple stressors including deaths in the family along with close friends in the hospital. Patient was also hypertensive upon arriving at the emergency department. However MRI does not show any abnormalities. Transient  confusion most likely is multifactorial including sedating medications, stressors, elevated blood  pressure.  Recommend: --Minimizing sedating medications as best as possible --Helping with coping skills with multiple stressors that she is under --Blood pressure control

## 2016-10-19 NOTE — Progress Notes (Signed)
EEG Completed; Results Pending  

## 2016-10-19 NOTE — Procedures (Signed)
HPI:  83 y/o with MS change  TECHNICAL SUMMARY:  A multichannel referential and bipolar montage EEG using the standard international 10-20 system was performed on the patient described as awake, drowsy and asleep.  The dominant background activity consists of 9-10 hertz activity seen most prominantly over the posterior head region.  The backgound activity is minimally reactive to eye opening and closing procedures.  Low voltage fast (beta) activity is distributed symmetrically and maximally over the anterior head regions.  ACTIVATION:  Stepwise photic stimulation at 4-20 flashes per second was performed and did not elicit any abnormal waveforms.  Hyperventilation was not performed.  EPILEPTIFORM ACTIVITY:  There were no spikes, sharp waves or paroxysmal activity.  SLEEP:  Stage I and brief stage II sleep architecture was identified.  CARDIAC:  The EKG lead revealed a regular sinus rhythm.  IMPRESSION:  This is a normal EEG for the patients stated age.  There were no focal, hemispheric or lateralizing features.  No epileptiform activity was recorded.  A normal EEG does not exclude the diagnosis of a seizure disorder and if seizure remains high on the list of differential diagnosis, an ambulatory EEG may be of value.  Clinical correlation is required.

## 2016-10-19 NOTE — Progress Notes (Signed)
CM spoke with pt regarding PT's recommendation: HHPT. Pt states she doesn't won't home health PT. States she has had it in the past and her family will help assist her if needed. Gae GallopAngela Daryle Amis RN,BSN,CM

## 2016-10-19 NOTE — Evaluation (Signed)
Physical Therapy Evaluation Patient Details Name: Nicole Key MRN: [id number] DOB: [birth year] Today's Date: 10/19/2016   History of Present Illness  Nicole Key is an 83 y.o. female with medical history significant for chronic abdominal pain, anxiety, depression, hypertension, GERD. Mnire's disease, severe protein calorie malnutrition, unstable gait presents to the emergency department with chief complaint of acute encephalopathy and chest pain.    Clinical Impression  Patient presents with decreased independence and safety with mobility.  Currently close to 100% fall risk, but has 24/7 capable assist per grandson so recommend HHPT at d/c.  Noted some issues with vertigo in the past and suspicious for positional vertigo, but pt did not want me to test or attempt treatment at this time.  Will follow acutely until d/c home.     Follow Up Recommendations Home health PT;Supervision/Assistance - 24 hour    Equipment Recommendations  None recommended by PT    Recommendations for Other Services       Precautions / Restrictions Precautions Precautions: Fall Restrictions Weight Bearing Restrictions: No      Mobility  Bed Mobility Overal bed mobility: Needs Assistance Bed Mobility: Supine to Sit     Supine to sit: Min guard     General bed mobility comments: increased time with railing  Transfers Overall transfer level: Needs assistance Equipment used: Rolling walker (2 wheeled) Transfers: Sit to/from UGI CorporationStand;Stand Pivot Transfers Sit to Stand: Max assist;Mod assist Stand pivot transfers: Mod assist;+2 safety/equipment       General transfer comment: patient fearful, dizzy on her feet, mod support to stand and then +2 for pivotal steps to chair pt states she is falling, lowered into chair   Ambulation/Gait             General Gait Details: unable  Stairs            Wheelchair Mobility    Modified Rankin (Stroke Patients Only)       Balance  Overall balance assessment: Needs assistance   Sitting balance-Leahy Scale: Fair   Postural control: Posterior lean Standing balance support: Bilateral upper extremity supported Standing balance-Leahy Scale: Poor                               Pertinent Vitals/Pain Pain Assessment: Faces Faces Pain Scale: Hurts little more Pain Location: generalized Pain Descriptors / Indicators: Aching Pain Intervention(s): Monitored during session    Home Living Family/patient expects to be discharged to:: Private residence Living Arrangements: Children Available Help at Discharge: Personal care attendant;Available 24 hours/day Type of Home: House Home Access: Stairs to enter Entrance Stairs-Rails: Right Entrance Stairs-Number of Steps: 3 Home Layout: Two level Home Equipment: Walker - 2 wheels;Bedside commode;Wheelchair - manual;Cane - single point      Prior Function Level of Independence: Needs assistance   Gait / Transfers Assistance Needed: uses walker, but more unsteady since recent fall  ADL's / Homemaking Assistance Needed: has caregivers to assist with bathing        Hand Dominance        Extremity/Trunk Assessment   Upper Extremity Assessment Upper Extremity Assessment: Generalized weakness    Lower Extremity Assessment Lower Extremity Assessment: Generalized weakness    Cervical / Trunk Assessment Cervical / Trunk Assessment: Kyphotic;Other exceptions Cervical / Trunk Exceptions: patient cachexic, bruising R foreahead and cheek  Communication   Communication: No difficulties  Cognition Arousal/Alertness: Awake/alert Behavior During Therapy: Anxious Overall Cognitive Status: No family/caregiver present  to determine baseline cognitive functioning                      General Comments General comments (skin integrity, edema, etc.): spoke with grandson by phone to confirm 24 hour assist, equipment and okay for home with A and HHPT     Exercises     Assessment/Plan    PT Assessment Patient needs continued PT services  PT Problem List Decreased strength;Decreased mobility;Decreased activity tolerance;Decreased balance;Decreased knowledge of use of DME;Decreased safety awareness       PT Treatment Interventions DME instruction;Therapeutic activities;Gait training;Therapeutic exercise;Patient/family education;Balance training;Functional mobility training    PT Goals (Current goals can be found in the Care Plan section)  Acute Rehab PT Goals Patient Stated Goal: To go home PT Goal Formulation: With patient/family Time For Goal Achievement: 10/26/16 Potential to Achieve Goals: Fair    Frequency Min 3X/week   Barriers to discharge        Co-evaluation               End of Session Equipment Utilized During Treatment: Gait belt Activity Tolerance: Patient limited by fatigue Patient left: in chair;with call bell/phone within reach;with chair alarm set   PT Visit Diagnosis: History of falling (Z91.81);Other abnormalities of gait and mobility (R26.89);Muscle weakness (generalized) (M62.81)    Functional Assessment Tool Used: AM-PAC 6 Clicks Basic Mobility Functional Limitation: Mobility: Walking and moving around Mobility: Walking and Moving Around Current Status (R6045): At least 60 percent but less than 80 percent impaired, limited or restricted Mobility: Walking and Moving Around Goal Status [phone]): At least 40 percent but less than 60 percent impaired, limited or restricted    Time: 1914-7829 PT Time Calculation (min) (ACUTE ONLY): 26 min   Charges:   PT Evaluation $PT Eval Moderate Complexity: 1 Procedure PT Treatments $Therapeutic Activity: 8-22 mins   PT G Codes:   PT G-Codes **NOT FOR INPATIENT CLASS** Functional Assessment Tool Used: AM-PAC 6 Clicks Basic Mobility Functional Limitation: Mobility: Walking and moving around Mobility: Walking and Moving Around Current Status (F6213): At least  60 percent but less than 80 percent impaired, limited or restricted Mobility: Walking and Moving Around Goal Status [phone]): At least 40 percent but less than 60 percent impaired, limited or restricted     Elray Mcgregor 10/19/2016, 1:50 PM  Sheran Lawless, PT [phone] 10/19/2016

## 2016-10-19 NOTE — Care Management Obs Status (Signed)
MEDICARE OBSERVATION STATUS NOTIFICATION   Patient Details  Name: Nicole Key MRN: [id number] Date of Birth: 07/27/1928   Medicare Observation Status Notification Given:  Yes    Lawerance Sabalebbie Annabel Gibeau, RN 10/19/2016, 2:57 PM

## 2016-10-19 NOTE — Progress Notes (Signed)
Initial Nutrition Assessment  DOCUMENTATION CODES:   Severe malnutrition in context of chronic illness, Underweight  INTERVENTION:   Mighty Shake II TID between meals, each supplement provides 480-500 kcals and 20-23 grams of protein  NUTRITION DIAGNOSIS:   Malnutrition related to chronic illness as evidenced by severe depletion of muscle mass, severe depletion of body fat.  GOAL:   Patient will meet greater than or equal to 90% of their needs  MONITOR:   PO intake, Supplement acceptance  REASON FOR ASSESSMENT:   Consult Assessment of nutrition requirement/status  ASSESSMENT:   83 y.o. female with PMH of chronic abdominal pain, anxiety, depression, hypertension, GERD, Mnire's disease, severe protein calorie malnutrition, unstable gait who presented to the emergency department on 2/21 with chief complaint of acute encephalopathy and chest pain.   Patient reports that she is unable to eat a lot at one time. She is unsure of her usual weight. She does not like PO supplements because they upset her stomach. She agreed to try Mighty Shake II supplements between meals as snacks. Nutrition-Focused physical exam completed. Findings are severe fat depletion, severe muscle depletion, and no edema.  Patient with ongoing severe PCM. Patient is underweight with BMI=15.  Diet Order:  Diet regular Room service appropriate? Yes; Fluid consistency: Thin Diet general  Skin:  Reviewed, no issues  Last BM:  unknown  Height:   Ht Readings from Last 1 Encounters:  10/19/16 5\' 8"  (1.727 m)    Weight:   Wt Readings from Last 1 Encounters:  10/19/16 99 lb 9.6 oz (45.2 kg)    Ideal Body Weight:  63.6 kg  BMI:  Body mass index is 15.14 kg/m.  Estimated Nutritional Needs:   Kcal:  1400-1600  Protein:  70-80 gm  Fluid:  1.5 L  EDUCATION NEEDS:   No education needs identified at this time  Joaquin CourtsKimberly Harris, RD, LDN, CNSC Pager [phone] After Hours Pager [phone]

## 2017-03-08 ENCOUNTER — Observation Stay (HOSPITAL_COMMUNITY)
Admission: EM | Admit: 2017-03-08 | Discharge: 2017-03-10 | Disposition: A | Discharge status: Home / Self Care | Payer: Medicare Other | Attending: Family Medicine | Admitting: Family Medicine

## 2017-03-08 ENCOUNTER — Emergency Department (HOSPITAL_COMMUNITY): Payer: Medicare Other

## 2017-03-08 ENCOUNTER — Other Ambulatory Visit (HOSPITAL_COMMUNITY): Payer: Self-pay

## 2017-03-08 ENCOUNTER — Encounter (HOSPITAL_COMMUNITY): Payer: Self-pay | Admitting: Emergency Medicine

## 2017-03-08 ENCOUNTER — Other Ambulatory Visit: Payer: Self-pay

## 2017-03-08 DIAGNOSIS — R0789 Other chest pain: Principal | ICD-10-CM | POA: Insufficient documentation

## 2017-03-08 DIAGNOSIS — K219 Gastro-esophageal reflux disease without esophagitis: Secondary | ICD-10-CM | POA: Diagnosis present

## 2017-03-08 DIAGNOSIS — K449 Diaphragmatic hernia without obstruction or gangrene: Secondary | ICD-10-CM

## 2017-03-08 DIAGNOSIS — R636 Underweight: Secondary | ICD-10-CM | POA: Diagnosis present

## 2017-03-08 DIAGNOSIS — F32A Depression, unspecified: Secondary | ICD-10-CM | POA: Diagnosis present

## 2017-03-08 DIAGNOSIS — Z79899 Other long term (current) drug therapy: Secondary | ICD-10-CM | POA: Diagnosis not present

## 2017-03-08 DIAGNOSIS — E43 Unspecified severe protein-calorie malnutrition: Secondary | ICD-10-CM | POA: Diagnosis present

## 2017-03-08 DIAGNOSIS — R09A2 Foreign body sensation, throat: Secondary | ICD-10-CM | POA: Diagnosis present

## 2017-03-08 DIAGNOSIS — R634 Abnormal weight loss: Secondary | ICD-10-CM | POA: Diagnosis present

## 2017-03-08 DIAGNOSIS — R1310 Dysphagia, unspecified: Secondary | ICD-10-CM

## 2017-03-08 DIAGNOSIS — N179 Acute kidney failure, unspecified: Secondary | ICD-10-CM | POA: Diagnosis present

## 2017-03-08 DIAGNOSIS — R079 Chest pain, unspecified: Secondary | ICD-10-CM | POA: Diagnosis not present

## 2017-03-08 DIAGNOSIS — I679 Cerebrovascular disease, unspecified: Secondary | ICD-10-CM | POA: Diagnosis present

## 2017-03-08 DIAGNOSIS — I10 Essential (primary) hypertension: Secondary | ICD-10-CM | POA: Diagnosis present

## 2017-03-08 DIAGNOSIS — M542 Cervicalgia: Secondary | ICD-10-CM

## 2017-03-08 DIAGNOSIS — F419 Anxiety disorder, unspecified: Secondary | ICD-10-CM | POA: Diagnosis present

## 2017-03-08 DIAGNOSIS — R0989 Other specified symptoms and signs involving the circulatory and respiratory systems: Secondary | ICD-10-CM | POA: Diagnosis present

## 2017-03-08 DIAGNOSIS — F329 Major depressive disorder, single episode, unspecified: Secondary | ICD-10-CM | POA: Diagnosis present

## 2017-03-08 HISTORY — DX: Pneumonitis due to inhalation of food and vomit: J69.0

## 2017-03-08 HISTORY — DX: Diaphragmatic hernia with obstruction, without gangrene: K44.0

## 2017-03-08 HISTORY — DX: Syncope and collapse: R55

## 2017-03-08 LAB — CBC
HCT: 41.8 % (ref 36.0–46.0)
HEMOGLOBIN: 14 g/dL (ref 12.0–15.0)
MCH: 30.6 pg (ref 26.0–34.0)
MCHC: 33.5 g/dL (ref 30.0–36.0)
MCV: 91.3 fL (ref 78.0–100.0)
PLATELETS: 272 10*3/uL (ref 150–400)
RBC: 4.58 MIL/uL (ref 3.87–5.11)
RDW: 16.5 % — AB (ref 11.5–15.5)
WBC: 12.9 10*3/uL — ABNORMAL HIGH (ref 4.0–10.5)

## 2017-03-08 LAB — URINALYSIS, ROUTINE W REFLEX MICROSCOPIC
Bilirubin Urine: NEGATIVE
GLUCOSE, UA: NEGATIVE mg/dL
Hgb urine dipstick: NEGATIVE
Ketones, ur: NEGATIVE mg/dL
Nitrite: NEGATIVE
PH: 6 (ref 5.0–8.0)
Protein, ur: NEGATIVE mg/dL
SPECIFIC GRAVITY, URINE: 1.014 (ref 1.005–1.030)

## 2017-03-08 LAB — BASIC METABOLIC PANEL
ANION GAP: 11 (ref 5–15)
BUN: 16 mg/dL (ref 6–20)
CALCIUM: 9 mg/dL (ref 8.9–10.3)
CO2: 28 mmol/L (ref 22–32)
CREATININE: 1.38 mg/dL — AB (ref 0.44–1.00)
Chloride: 100 mmol/L — ABNORMAL LOW (ref 101–111)
GFR calc Af Amer: 39 mL/min — ABNORMAL LOW (ref >60)
GFR, EST NON AFRICAN AMERICAN: 34 mL/min — AB (ref >60)
GLUCOSE: 118 mg/dL — AB (ref 65–99)
Potassium: 4.6 mmol/L (ref 3.5–5.1)
Sodium: 139 mmol/L (ref 135–145)

## 2017-03-08 LAB — I-STAT TROPONIN, ED: TROPONIN I, POC: 0 ng/mL (ref 0.00–0.08)

## 2017-03-08 LAB — TROPONIN I

## 2017-03-08 MED ORDER — ONDANSETRON HCL 4 MG/2ML IJ SOLN: 4.0000 mg | Freq: Four times a day (QID) | INTRAMUSCULAR | Status: DC | PRN

## 2017-03-08 MED ORDER — SODIUM CHLORIDE 0.9 % IV BOLUS (SEPSIS)
500.0000 mL | Freq: Once | INTRAVENOUS | Status: AC
  Administered 2017-03-08: 500 mL via INTRAVENOUS

## 2017-03-08 MED ORDER — ACETAMINOPHEN 325 MG PO TABS
650.0000 mg | ORAL_TABLET | ORAL | Status: DC | PRN
  Administered 2017-03-09 – 2017-03-10 (×3): 650 mg via ORAL
  Filled 2017-03-08 (×3): qty 2

## 2017-03-08 MED ORDER — NITROGLYCERIN 0.4 MG SL SUBL: 0.4000 mg | SUBLINGUAL_TABLET | SUBLINGUAL | Status: DC | PRN

## 2017-03-08 MED ORDER — HYOSCYAMINE SULFATE 0.125 MG PO TABS: 0.1250 mg | ORAL_TABLET | ORAL | Status: DC | PRN

## 2017-03-08 MED ORDER — ALPRAZOLAM 0.25 MG PO TABS
0.2500 mg | ORAL_TABLET | Freq: Every evening | ORAL | Status: DC | PRN
  Administered 2017-03-09: 0.25 mg via ORAL
  Filled 2017-03-08: qty 1

## 2017-03-08 MED ORDER — DEXTROSE 5 % IV SOLN
1.0000 g | INTRAVENOUS | Status: DC
  Administered 2017-03-08 – 2017-03-09 (×2): 1 g via INTRAVENOUS
  Filled 2017-03-08 (×2): qty 10

## 2017-03-08 MED ORDER — ENOXAPARIN SODIUM 40 MG/0.4ML ~~LOC~~ SOLN
40.0000 mg | SUBCUTANEOUS | Status: DC
  Administered 2017-03-08: 40 mg via SUBCUTANEOUS
  Filled 2017-03-08: qty 0.4

## 2017-03-08 MED ORDER — METOPROLOL TARTRATE 25 MG PO TABS
25.0000 mg | ORAL_TABLET | Freq: Two times a day (BID) | ORAL | Status: DC
  Administered 2017-03-08 – 2017-03-10 (×4): 25 mg via ORAL
  Filled 2017-03-08 (×4): qty 1

## 2017-03-08 MED ORDER — MECLIZINE HCL 25 MG PO TABS: 25.0000 mg | ORAL_TABLET | Freq: Three times a day (TID) | ORAL | Status: DC | PRN

## 2017-03-08 MED ORDER — TRAZODONE HCL 50 MG PO TABS
50.0000 mg | ORAL_TABLET | Freq: Every evening | ORAL | Status: DC | PRN
Start: 2017-03-08 — End: 2017-03-10
  Administered 2017-03-09: 50 mg via ORAL
  Filled 2017-03-08: qty 1

## 2017-03-08 MED ORDER — PANTOPRAZOLE SODIUM 40 MG PO TBEC
40.0000 mg | DELAYED_RELEASE_TABLET | Freq: Two times a day (BID) | ORAL | Status: DC
  Administered 2017-03-09 – 2017-03-10 (×3): 40 mg via ORAL
  Filled 2017-03-08 (×3): qty 1

## 2017-03-08 MED ORDER — LORAZEPAM 2 MG/ML IJ SOLN
0.5000 mg | Freq: Once | INTRAMUSCULAR | Status: AC
Start: 2017-03-08 — End: 2017-03-08
  Administered 2017-03-08: 0.5 mg via INTRAVENOUS
  Filled 2017-03-08: qty 1

## 2017-03-08 NOTE — ED Provider Notes (Addendum)
Complains of intermittent left anterior chest pain for one month sometimes which radiates to the neck. Symptoms accompanied by shortness of breath. Patient asymptomatic as I examine her. On exam patient is frail, chronically ill-appearing appears anxious. Lungs clear auscultation heart regular rate and rhythm abdomen nondistended nontender extremities without edema chest x-ray viewed by me   Doug SouJacubowitz, Tanish Prien, MD 03/08/17 2041 8:40 PM patient moaning she states nothing specifically hurts she just "feels bad. She appears very anxious.IV Ativan ordered.  Date: 03/08/2017  Rate: 80  Rhythm: normal sinus rhythm  QRS Axis: normal  Intervals: normal  ST/T Wave abnormalities: normal  Conduction Disutrbances: none  Narrative Interpretation: unremarkable      Doug SouJacubowitz, Chucky Homes, MD 03/08/17 2325

## 2017-03-08 NOTE — ED Notes (Signed)
Pt seems to be relaxing a little bit better at this time.

## 2017-03-08 NOTE — ED Notes (Signed)
Pt placed on bedpan and unable to urinate.

## 2017-03-08 NOTE — H&P (Signed)
History and Physical    Nicole Key ZOX:[id number] DOB: 05-Feb-1928 DOA: 03/08/2017  PCP: Geoffry Paradise, MD  Patient coming from: Home  I have personally briefly reviewed patient's old medical records in [facility]  Chief Complaint: Chest pain  HPI: Nicole Key is a 83 y.o. female with medical history significant of anxiety, HTN, GERD, old L shoulder injury.  Patient presents to the ED with c/o L sided chest pain, radiation to L shoulder, arm, and neck.  Pain has been ongoing for 1-2 weeks but worsened significantly today.  Associated SOB, tremors.  History is complicated by fact that patient also appears to be having a panic attack in ED as well.  Unclear if CP associated or independent of this or not.   ED Course: Given 0.5mg  ativan IV, patient currently CP free, somewhat more calm but still anxious.  EKG unremarkable, trop neg, admitting for obs.  Patient also has 1 day history of dysuria, feeling like she needs to go to bathroom but cant go.  Review of Systems: As per HPI otherwise 10 point review of systems negative.   Past Medical History:  Diagnosis Date  . Anxiety   . Arthritis   . Fibromyalgia   . GERD (gastroesophageal reflux disease)   . Hypertension   . Insomnia   . Meniere's disease 1989  . Vertigo     Past Surgical History:  Procedure Laterality Date  . ABDOMINAL HYSTERECTOMY    . APPENDECTOMY    . BILATERAL TOTAL MASTECTOMY WITH AXILLARY LYMPH NODE DISSECTION  1981     reports that she has never smoked. She has never used smokeless tobacco. She reports that she does not drink alcohol or use drugs.  Allergies  Allergen Reactions  . Shellfish Allergy Other (See Comments)    unknown    Family History  Problem Relation Age of Onset  . Aortic aneurysm Mother        Had ruptured aorta  . Lung cancer Father   . Heart disease Father      Prior to Admission medications   Medication Sig Start Date End Date Taking? Authorizing Provider    ALPRAZolam (XANAX) 0.25 MG tablet Take 1 tablet (0.25 mg total) by mouth at bedtime as needed (anxiety). 10/19/16   Joseph Art, DO  hyoscyamine (LEVSIN, ANASPAZ) 0.125 MG tablet Take 0.125 mg by mouth every 4 (four) hours as needed for bladder spasms or cramping.    [provider]  meclizine (ANTIVERT) 25 MG tablet Take 25 mg by mouth 3 (three) times daily as needed for dizziness. 08/25/16   [provider]  metoprolol tartrate (LOPRESSOR) 25 MG tablet Take 1 tablet (25 mg total) by mouth 2 (two) times daily. 10/19/16   Joseph Art, DO  pantoprazole (PROTONIX) 40 MG tablet Take 1 tablet (40 mg total) by mouth 2 (two) times daily before a meal. 02/09/15   Jerald Kief, MD  traZODone (DESYREL) 50 MG tablet Take 1 tablet (50 mg total) by mouth at bedtime as needed for sleep. 02/09/15   Jerald Kief, MD    Physical Exam: Vitals:   03/08/17 2030 03/08/17 2043 03/08/17 2045 03/08/17 2100  BP: (!) 182/82 (!) 183/100 (!) 176/86 (!) 169/77  Pulse: 73 82 83 80  Resp:  (!) 30 (!) 25 (!) 24  Temp:      TempSrc:      SpO2:  100%      Constitutional: NAD, calm, comfortable Eyes: PERRL, lids  and conjunctivae normal ENMT: Mucous membranes are moist. Posterior pharynx clear of any exudate or lesions.Normal dentition.  Neck: normal, supple, no masses, no thyromegaly Respiratory: clear to auscultation bilaterally, no wheezing, no crackles. Normal respiratory effort. No accessory muscle use.  Cardiovascular: Regular rate and rhythm, no murmurs / rubs / gallops. No extremity edema. 2+ pedal pulses. No carotid bruits.  Abdomen: no tenderness, no masses palpated. No hepatosplenomegaly. Bowel sounds positive.  Musculoskeletal: Some pain with ROM of L shoulder but this is "different" than CP that brought her in tonight. Skin: no rashes, lesions, ulcers. No induration Neurologic: CN 2-12 grossly intact. Sensation intact, DTR normal. Strength 5/5 in all 4.  Psychiatric: Normal  judgment and insight. Alert and oriented x 3. Normal mood.    Labs on Admission: I have personally reviewed following labs and imaging studies  CBC:  Recent Labs Lab 03/08/17 1530  WBC 12.9*  HGB 14.0  HCT 41.8  MCV 91.3  PLT 272   Basic Metabolic Panel:  Recent Labs Lab 03/08/17 1530  NA 139  K 4.6  CL 100*  CO2 28  GLUCOSE 118*  BUN 16  CREATININE 1.38*  CALCIUM 9.0   GFR: CrCl cannot be calculated (Unknown ideal weight.). Liver Function Tests: No results for input(s): AST, ALT, ALKPHOS, BILITOT, PROT, ALBUMIN in the last 168 hours. No results for input(s): LIPASE, AMYLASE in the last 168 hours. No results for input(s): AMMONIA in the last 168 hours. Coagulation Profile: No results for input(s): INR, PROTIME in the last 168 hours. Cardiac Enzymes: No results for input(s): CKTOTAL, CKMB, CKMBINDEX, TROPONINI in the last 168 hours. BNP (last 3 results) No results for input(s): PROBNP in the last 8760 hours. HbA1C: No results for input(s): HGBA1C in the last 72 hours. CBG: No results for input(s): GLUCAP in the last 168 hours. Lipid Profile: No results for input(s): CHOL, HDL, LDLCALC, TRIG, CHOLHDL, LDLDIRECT in the last 72 hours. Thyroid Function Tests: No results for input(s): TSH, T4TOTAL, FREET4, T3FREE, THYROIDAB in the last 72 hours. Anemia Panel: No results for input(s): VITAMINB12, FOLATE, FERRITIN, TIBC, IRON, RETICCTPCT in the last 72 hours. Urine analysis:    Component Value Date/Time   COLORURINE YELLOW 03/08/2017 1935   APPEARANCEUR CLEAR 03/08/2017 1935   LABSPEC 1.014 03/08/2017 1935   PHURINE 6.0 03/08/2017 1935   GLUCOSEU NEGATIVE 03/08/2017 1935   HGBUR NEGATIVE 03/08/2017 1935   BILIRUBINUR NEGATIVE 03/08/2017 1935   KETONESUR NEGATIVE 03/08/2017 1935   PROTEINUR NEGATIVE 03/08/2017 1935   UROBILINOGEN 0.2 02/03/2015 1928   NITRITE NEGATIVE 03/08/2017 1935   LEUKOCYTESUR MODERATE (A) 03/08/2017 1935    Radiological Exams on  Admission: Dg Chest 2 View  Result Date: 03/08/2017 CLINICAL DATA:  Chest pain. EXAM: CHEST  2 VIEW COMPARISON:  Radiograph of October 18, 2016. FINDINGS: Stable cardiomediastinal silhouette. No pneumothorax or pleural effusion is noted. Stable large hiatal hernia is noted. No acute pulmonary disease is noted. Bony thorax is unremarkable. IMPRESSION: No active cardiopulmonary disease.  Stable large hiatal hernia. Electronically Signed   By: Lupita Raider, M.D.   On: 03/08/2017 15:48   Dg Shoulder Left  Result Date: 03/08/2017 CLINICAL DATA:  Pain. EXAM: LEFT SHOULDER - 2+ VIEW COMPARISON:  None. FINDINGS: Limited views left chest are normal. No fracture identified. The transscapular Y views are limited but the axillary view demonstrates no evidence of dislocation. Degenerative changes are noted. IMPRESSION: Degenerative changes.  No fracture or dislocation. Electronically Signed   By: Gerome Sam III  M.D   On: 03/08/2017 20:41    EKG: Independently reviewed.  Assessment/Plan Active Problems:   Chest pain, rule out acute myocardial infarction    1. CP R/O - 1. CP obs pathway 2. Serial trops 3. Tele monitor 4. If CP returns then try NTG 5. Also consider benzos as patient appeared to have component on anxiety / panic attack on eval in ED. 6. However cannot tell if anxiety / panic attack is cause of chest pain or brought on by underlying crescendo angina (coronary).  Therefore admitting for obs and rule out. 2. HTN - continue home BP meds 3. UTI - 1. UA as well as dysuria symptoms 2. Will treat with rocephin 3. Cultures pending 4. Could also be contributing to anxiety / AMS in this 83 yo lady.  DVT prophylaxis: Lovenox Code Status: Full Family Communication: Grandson at bedside Disposition Plan: Home after admit Consults called: None Admission status: Place in [place]   GARDNER, Heywood IlesJARED M. DO [facility] Pager [phone]  If 7AM-7PM, please contact day team taking  care of patient [email or web address] Password [email or web address]  03/08/2017, 9:09 PM

## 2017-03-08 NOTE — ED Notes (Signed)
Pt was taken to XR and became very anxious.  She keeps stating that something is going on with her but she doesn't know what.  EDP at bedside.  EDP verbally ordered 0.5mg  of ativan.

## 2017-03-08 NOTE — ED Notes (Signed)
Unsuccessful attempt at giving report.  Secretary stated that nurse is tied up at the moment and that she will call me back.

## 2017-03-08 NOTE — ED Provider Notes (Signed)
[facility]-EMERGENCY DEPT Provider Note   CSN: [id number] Arrival date & time: 03/08/17  1508     History   Chief Complaint Chief Complaint  Patient presents with  . Chest Pain    HPI Nicole Key is a 83 y.o. female with a past medical history significant for anxiety, fibromyalgia, arthritis, hypertension, Mnire's disease presenting with a month and a half of left shoulder pain which radiates up her neck to the back of her head, scapula and left elbow. Today she reports a heaviness in her chest and feeling like she is shaking inside. She also endorses some shortness of breath and nausea, no vomiting. She points to her epigastric region stating that everything feels like it is shaking inside. She has taken 2 hydrocodone with mild relief of symptoms. She has called her physician who told her to come to the emergency department. She denies dysuria, hematuria, blood in her stool, fever, chills. Last bowel movement was yesterday and normal. Family in the room reports that she has reported some upper back discomfort and believes she may have a UTI due to strong odor. Lucila MaineGrandson is distraught and states that he has never seen her this way, she typically doesn't complain of pain.  HPI  Past Medical History:  Diagnosis Date  . Anxiety   . Arthritis   . Fibromyalgia   . GERD (gastroesophageal reflux disease)   . Hypertension   . Insomnia   . Meniere's disease 1989  . Vertigo     Patient Active Problem List   Diagnosis Date Noted  . Encephalopathy 10/18/2016  . Acute encephalopathy 10/18/2016  . Chest pain 10/18/2016  . Abdominal pain 10/18/2016  . Hypertension   . Incarcerated hiatal hernia with obstruction 02/07/2015  . Acute respiratory failure with hypoxia (HCC)   . Aspiration pneumonia (HCC)   . CAP (community acquired pneumonia) 02/04/2015  . Sepsis (HCC) 02/04/2015  . Anxiety 02/04/2015  . Essential hypertension 02/04/2015  . Fall 02/04/2015  . Generalized weakness 02/04/2015   . GERD (gastroesophageal reflux disease) 02/04/2015  . Protein-calorie malnutrition, severe (HCC) 02/04/2015  . Syncope 08/15/2013  . Facial contusion 08/15/2013  . Unstable gait 08/15/2013  . Depression 08/15/2013  . Overdose drug 07/18/2011  . Osteoporosis 07/18/2011  . Meniere's disease 07/18/2011    Past Surgical History:  Procedure Laterality Date  . ABDOMINAL HYSTERECTOMY    . APPENDECTOMY    . BILATERAL TOTAL MASTECTOMY WITH AXILLARY LYMPH NODE DISSECTION  1981    OB History    No data available       Home Medications    Prior to Admission medications   Medication Sig Start Date End Date Taking? Authorizing Provider  ALPRAZolam (XANAX) 0.25 MG tablet Take 1 tablet (0.25 mg total) by mouth at bedtime as needed (anxiety). 10/19/16   Joseph ArtVann, Shirrell Solinger U, DO  hyoscyamine (LEVSIN, ANASPAZ) 0.125 MG tablet Take 0.125 mg by mouth every 4 (four) hours as needed for bladder spasms or cramping.    [provider]  meclizine (ANTIVERT) 25 MG tablet Take 25 mg by mouth 3 (three) times daily as needed for dizziness. 08/25/16   [provider]  metoprolol tartrate (LOPRESSOR) 25 MG tablet Take 1 tablet (25 mg total) by mouth 2 (two) times daily. 10/19/16   Joseph ArtVann, Keylee Shrestha U, DO  pantoprazole (PROTONIX) 40 MG tablet Take 1 tablet (40 mg total) by mouth 2 (two) times daily before a meal. 02/09/15   Jerald Kiefhiu, Stephen K, MD  traZODone (DESYREL) 50 MG tablet  Take 1 tablet (50 mg total) by mouth at bedtime as needed for sleep. 02/09/15   Jerald Kief, MD    Family History Family History  Problem Relation Age of Onset  . Aortic aneurysm Mother        Had ruptured aorta  . Lung cancer Father   . Heart disease Father     Social History Social History  Substance Use Topics  . Smoking status: Never Smoker  . Smokeless tobacco: Never Used  . Alcohol use No     Comment: denies alcohol use     Allergies   Shellfish allergy   Review of Systems Review of Systems    Constitutional: Negative for chills and fever.  HENT: Negative for ear pain and sore throat.   Eyes: Negative for pain and visual disturbance.  Respiratory: Negative for cough, shortness of breath, wheezing and stridor.   Cardiovascular: Positive for chest pain. Negative for palpitations.  Gastrointestinal: Positive for nausea. Negative for abdominal distention, abdominal pain, blood in stool and vomiting.  Genitourinary: Negative for dysuria, flank pain and hematuria.  Musculoskeletal: Negative for arthralgias and back pain.  Skin: Negative for color change, pallor and rash.  Neurological: Negative for dizziness, seizures and syncope.     Physical Exam Updated Vital Signs BP (!) 189/85 (BP Location: Left Arm)   Pulse 72   Temp 97.9 F (36.6 C) (Oral)   Resp 16   SpO2 97%   Physical Exam  Constitutional: She is oriented to person, place, and time. She appears well-developed and well-nourished. No distress.  Afebrile, chronically ill-appearing, anxious appearing speaking in 3-4 words sentences.  HENT:  Head: Normocephalic and atraumatic.  Eyes: Conjunctivae are normal.  Neck: Neck supple.  Cardiovascular: Normal rate, regular rhythm, normal heart sounds and intact distal pulses.   No murmur heard. Pulmonary/Chest: Effort normal and breath sounds normal. No respiratory distress. She has no wheezes. She has no rales.  Abdominal: Soft. She exhibits no distension and no mass. There is no tenderness. There is no rebound and no guarding.  Overactive bowel sounds  Musculoskeletal: Normal range of motion. She exhibits tenderness. She exhibits no edema or deformity.  Full range of motion of the shoulder. Inconsistent exam, patient denies any tenderness to palpation.  Neurological: She is alert and oriented to person, place, and time. No sensory deficit. She exhibits normal muscle tone.  5/5 strength grips, plantar flexion/dorsiflexion bilaterally.  Skin: Skin is warm. She is not  diaphoretic.  Psychiatric: She has a normal mood and affect.  Nursing note and vitals reviewed.    ED Treatments / Results  Labs (all labs ordered are listed, but only abnormal results are displayed) Labs Reviewed  BASIC METABOLIC PANEL - Abnormal; Notable for the following:       Result Value   Chloride 100 (*)    Glucose, Bld 118 (*)    Creatinine, Ser 1.38 (*)    GFR calc non Af Amer 34 (*)    GFR calc Af Amer 39 (*)    All other components within normal limits  CBC - Abnormal; Notable for the following:    WBC 12.9 (*)    RDW 16.5 (*)    All other components within normal limits  URINALYSIS, ROUTINE W REFLEX MICROSCOPIC - Abnormal; Notable for the following:    Leukocytes, UA MODERATE (*)    Bacteria, UA RARE (*)    Squamous Epithelial / LPF 0-5 (*)    All other components within normal limits  Rosezena Sensor, ED    EKG  EKG Interpretation None       Radiology Dg Chest 2 View  Result Date: 03/08/2017 CLINICAL DATA:  Chest pain. EXAM: CHEST  2 VIEW COMPARISON:  Radiograph of October 18, 2016. FINDINGS: Stable cardiomediastinal silhouette. No pneumothorax or pleural effusion is noted. Stable large hiatal hernia is noted. No acute pulmonary disease is noted. Bony thorax is unremarkable. IMPRESSION: No active cardiopulmonary disease.  Stable large hiatal hernia. Electronically Signed   By: Lupita Raider, M.D.   On: 03/08/2017 15:48    Procedures Procedures (including critical care time)  Medications Ordered in ED Medications  sodium chloride 0.9 % bolus 500 mL (500 mLs Intravenous New Bag/Given 03/08/17 2010)     Initial Impression / Assessment and Plan / ED Course  I have reviewed the triage vital signs and the nursing notes.  Pertinent labs & imaging results that were available during my care of the patient were reviewed by me and considered in my medical decision making (see chart for details).     Patient presents with chest heaviness and shortness of  breath.  Initial troponin negative, no cardiopulmonary process on chest xray.  On exam she denies tenderness to palpation of shoulder, chest, no CVA tenderness. She denies pain at this time but keeps repeating that she feel like everything is shaking inside. No abdominal tenderness, can feel gas movement through bowel on exam. Patient reports normal BM yesterday.   Chest x-ray with large stable hiatal hernia. Labs remarkable for AKI at 1.38 from 0.63 four months ago and slightly elevated white count.  Waiting on urinanalysis  Patient was discussed with Dr. Ethelda Chick who has also seen patient and agrees with assessment and plan.  Patient will need to be admitted for chest pain rule out with AKI.  Patient of Dr. Jacky Kindle Consult placed for admission 20:10  Transferred patient care at end of shift to Dr. Ethelda Chick pending call from hospitalist for admission.  Call received from Lyda Perone who will come and evaluate patient.  Final Clinical Impressions(s) / ED Diagnoses   Final diagnoses:  Chest pain, unspecified type    New Prescriptions New Prescriptions   No medications on file     Gregary Cromer 03/08/17 2036    Doug Sou, MD 03/08/17 2326

## 2017-03-08 NOTE — ED Notes (Signed)
Jasper Loseraul Prevost (grandson/caregiver) [phone]

## 2017-03-08 NOTE — ED Triage Notes (Signed)
Pt sts left sided CP into the face and arm x weeks worse today

## 2017-03-08 NOTE — ED Notes (Signed)
Pt complaining of left shoulder pain with pain radiating to neck and arm. Also states pain goes down left side of back.

## 2017-03-09 ENCOUNTER — Observation Stay (HOSPITAL_COMMUNITY): Payer: Medicare Other

## 2017-03-09 ENCOUNTER — Encounter (HOSPITAL_COMMUNITY): Payer: Self-pay | Admitting: Internal Medicine

## 2017-03-09 DIAGNOSIS — K210 Gastro-esophageal reflux disease with esophagitis: Secondary | ICD-10-CM

## 2017-03-09 DIAGNOSIS — M542 Cervicalgia: Secondary | ICD-10-CM

## 2017-03-09 DIAGNOSIS — R079 Chest pain, unspecified: Secondary | ICD-10-CM | POA: Diagnosis not present

## 2017-03-09 DIAGNOSIS — I10 Essential (primary) hypertension: Secondary | ICD-10-CM | POA: Diagnosis not present

## 2017-03-09 DIAGNOSIS — R0989 Other specified symptoms and signs involving the circulatory and respiratory systems: Secondary | ICD-10-CM | POA: Diagnosis present

## 2017-03-09 DIAGNOSIS — N179 Acute kidney failure, unspecified: Secondary | ICD-10-CM | POA: Diagnosis not present

## 2017-03-09 DIAGNOSIS — I679 Cerebrovascular disease, unspecified: Secondary | ICD-10-CM | POA: Diagnosis present

## 2017-03-09 DIAGNOSIS — E43 Unspecified severe protein-calorie malnutrition: Secondary | ICD-10-CM | POA: Diagnosis not present

## 2017-03-09 DIAGNOSIS — R1310 Dysphagia, unspecified: Secondary | ICD-10-CM | POA: Diagnosis not present

## 2017-03-09 DIAGNOSIS — F419 Anxiety disorder, unspecified: Secondary | ICD-10-CM | POA: Diagnosis not present

## 2017-03-09 DIAGNOSIS — K449 Diaphragmatic hernia without obstruction or gangrene: Secondary | ICD-10-CM | POA: Diagnosis not present

## 2017-03-09 DIAGNOSIS — R634 Abnormal weight loss: Secondary | ICD-10-CM | POA: Diagnosis not present

## 2017-03-09 DIAGNOSIS — R636 Underweight: Secondary | ICD-10-CM

## 2017-03-09 DIAGNOSIS — F458 Other somatoform disorders: Secondary | ICD-10-CM

## 2017-03-09 LAB — HEPATIC FUNCTION PANEL
ALBUMIN: 3 g/dL — AB (ref 3.5–5.0)
ALK PHOS: 54 U/L (ref 38–126)
ALT: 8 U/L — ABNORMAL LOW (ref 14–54)
AST: 15 U/L (ref 15–41)
BILIRUBIN TOTAL: 0.4 mg/dL (ref 0.3–1.2)
Total Protein: 5.8 g/dL — ABNORMAL LOW (ref 6.5–8.1)

## 2017-03-09 LAB — TROPONIN I: Troponin I: <0.03 ng/mL (ref <0.03)

## 2017-03-09 LAB — LIPASE, BLOOD: LIPASE: 19 U/L (ref 11–51)

## 2017-03-09 MED ORDER — SODIUM CHLORIDE 0.9 % IV SOLN
INTRAVENOUS | Status: DC
  Administered 2017-03-09: 100 mL/h via INTRAVENOUS
  Administered 2017-03-09: 09:00:00 via INTRAVENOUS
  Administered 2017-03-10: 100 mL/h via INTRAVENOUS

## 2017-03-09 MED ORDER — LORAZEPAM 2 MG/ML IJ SOLN
0.5000 mg | Freq: Once | INTRAMUSCULAR | Status: AC
  Administered 2017-03-09: 0.5 mg via INTRAVENOUS
  Filled 2017-03-09: qty 1

## 2017-03-09 MED ORDER — ENOXAPARIN SODIUM 30 MG/0.3ML ~~LOC~~ SOLN
30.0000 mg | SUBCUTANEOUS | Status: DC
  Administered 2017-03-09: 30 mg via SUBCUTANEOUS
  Filled 2017-03-09: qty 0.3

## 2017-03-09 MED ORDER — GI COCKTAIL ~~LOC~~: 30.0000 mL | Freq: Three times a day (TID) | ORAL | Status: DC | PRN

## 2017-03-09 MED ORDER — DILTIAZEM 12 MG/ML ORAL SUSPENSION
30.0000 mg | Freq: Four times a day (QID) | ORAL | Status: DC
  Administered 2017-03-09 – 2017-03-10 (×3): 30 mg via ORAL
  Filled 2017-03-09 (×6): qty 3

## 2017-03-09 NOTE — Progress Notes (Signed)
Progress Note    ERCIA CRISAFULLI  WUJ:[id number] DOB: 10/08/1927  DOA: 03/08/2017 PCP: Geoffry Paradise, MD    Brief Narrative:   Chief complaint: Follow-up chest pain  Medical records reviewed and are as summarized below:  Nicole Key is an 83 y.o. female with a PMH of Large hiatal hernia with history of incarcerated hiatal hernia with obstruction dating back to 01/2015 (has been on twice a day PPI since that time), anxiety, hypertension, GERD and fibromyalgia who was admitted 03/08/17 with a chief complaint of a 1-2 week history of left-sided chest pain radiating to the left shoulder, arm and neck that worsened 24 hours prior to admission. Was given 0.5 mg of IV Ativan in the ED for a panic attack. EKG was unremarkable and initial troponin was negative.  A review of her prior records reveals that she was discharged 10/19/16 where it was noted that her PCP has been working on her left upper epigastric and left-sided chest pain for 3 years. She was thought to have a somatization disorder and polypharmacy during that admission. She was admitted for a incarcerated hiatal hernia with obstruction 01/2015 complicated by aspiration pneumonia and it appears surgery recommended  Assessment/Plan:   Principal Problem:   Chest pain/Neck pain, rule out acute myocardial infarction Troponins negative 3. EKG personally reviewed and shows sinus rhythm at 81 bpm, mild right axis deviation and mild prolonged QT interval. No ischemic changes. Patient noted to be having a panic attack in the ED, so it is possible that this is related to severe anxiety. Hemodynamically stable. A review of her medical records in Epic shows that she had a 2-D echocardiogram 08/15/13 which showed mild LVH and a normal EF. Chest x-ray and shoulder films show no acute findings. Given weight loss, reports of nausea and pain in epigastric area radiating to shoulder, will check LFTs and lipase. Check MRI of the C-spine. Has had CT  scans of the cervical spine which have been unrevealing in the past. It appears her PCP has been working on this issue for some time. I suspect that her symptoms are chronic and related to her large hiatal hernia rather than underlying cardiac disease.  Active Problems:     GERD (gastroesophageal reflux disease)/hiatal hernia/dysphasia with a globus sensation Given weight loss, reports of nausea and pain in epigastric area radiating to shoulder, will check LFTs and lipase. Continue BID PPI therapy. Chest and neck pain may be from esophageal spasms in the setting of a large hiatal hernia. Will get speech therapy evaluation/barium swallow to evaluate these symptoms as well as her dysphagia. Unfortunately, she has been evaluated by surgery in the past but was deemed too weak to have a repair done of her large hiatal hernia. She was supposed to follow-up with surgery as an outpatient. Barium swallow ordered.    Acute kidney injury Severely impaired renal function noted with creatinine greater than 2 times normal and an estimated GFR of 21.8. Baseline creatinine is 0.6-0.8. We'll start IV fluids and monitor. Not on any nephrotoxic medications.    Depression/ Anxiety: Worsening Patient is only on Xanax as needed. May benefit from SSRI therapy.    Essential hypertension/chronic cerebrovascular disease: Stable Systolic pressure is mildly elevated. Continue metoprolol. Add Cardizem which may help if she is having esophageal spasms.    Protein-calorie malnutrition, severe (HCC)/underweight/abnormal weight loss Body mass index is 15.78 kg/m. Dietitian consultation requested.   Family Communication/Anticipated D/C date and plan/Code Status   DVT  prophylaxis: Lovenox ordered. Code Status: Full Code.  Family Communication: No family at bedside. Daughter updated by telephone. Disposition Plan: Lives with daughter.   Medical Consultants:    None.   Anti-Infectives:    Rocephin  03/08/17--->  Subjective:   Had some pain in left neck and shoulder area earlier today. Says when the pain comes on, "I can hardly stand it". Reports weight loss and occasional nausea and vomiting. Review of systems positive for dysphasia. Has had significant weight loss.  Objective:    Vitals:   03/08/17 2145 03/08/17 2200 03/08/17 2238 03/09/17 0602  BP: (!) 149/73 (!) 148/72 (!) 161/68 (!) 155/71  Pulse: 86 88 83 68  Resp: 15 14 18    Temp:   98.4 F (36.9 C) 98.4 F (36.9 C)  TempSrc:   Oral Oral  SpO2:   96% 97%  Weight:   45.3 kg (99 lb 12.8 oz) 47.1 kg (103 lb 12.8 oz)    Intake/Output Summary (Last 24 hours) at 03/09/17 0800 Last data filed at 03/09/17 [id number]  Gross per 24 hour  Intake              550 ml  Output              400 ml  Net              150 ml   Filed Weights   03/08/17 2238 03/09/17 0602  Weight: 45.3 kg (99 lb 12.8 oz) 47.1 kg (103 lb 12.8 oz)    Exam: General exam: Frail, elderly thin female who appears underweight. Respiratory system: Lungs clear.  No wheezes, rales or rhonchi. Cardiovascular system: Heart sounds are regular.  No murmurs, rubs or gallops.  No JVD. Gastrointestinal system: Abdomen is soft, non-tender, non-distended with + bowel sounds.  No masses. Central nervous system: Alert and oriented x 3, non-focal. Extremities: No clubbing, edema or cyanosis. Skin: No rashes, warm and dry. Psychiatry: Mood and affect depressed/flat. Insight and judgement appear normal.  Data Reviewed:   I have personally reviewed following labs and imaging studies:  Labs: Labs show the following: BUN is 16, creatinine 1.38. Electrolytes unremarkable. WBC 12.9. Troponins negative 3. Urinalysis showed moderate leukocytes and rare bacteria, negative nitrites.  Pending labs: LFTs, lipase, urine culture  Procedures and diagnostic studies:  Dg Chest 2 View 03/08/2017: Stable large hiatal hernia. Otherwise unremarkable.     Dg Shoulder Left 03/08/2017:  Degenerative changes without fracture or dislocation.    Medications:   . enoxaparin (LOVENOX) injection  40 mg Subcutaneous Q24H  . metoprolol tartrate  25 mg Oral BID  . pantoprazole  40 mg Oral BID AC   Continuous Infusions: . cefTRIAXone (ROCEPHIN)  IV Stopped (03/08/17 2348)    Medical decision making is of high complexity and this patient is at high risk of deterioration, therefore this is a level 3 visit.  (> 4 problem points, >4 data points, mod risk: Need 2 out of 3)   Problems/DDx Points   Self limited or minor (max 2)       1    Established problem, stable       1  2+  Established problem, worsening: Chronic chest pain       2  2  New problem, no additional W/U planned (max 1): AKI       3  3  New problem, additional W/U planned        4     Data Reviewed Points  Review/order clinical lab tests       1  1  Review/order x-rays       1  1  Review/order tests (Echo, EKG, PFTs, etc)       1   Discussion of test results w/ performing MD       1   Independent review of image, tracing or specimen       2  2  Decision to obtain old records       1   Review and summation of old records       2  2   Level of risk Presenting prob Diagnostics Management   Minimal 1 self limited/minor Labs CXR EKG/EEG U/A U/S Rest Gargles Bandages Dressings   Low 2 or more self limited/minor 1 stable chronic Acute uncomplicated illness Tests (PFTS) Non-CV imaging Arterial labs Biopsies of skin OTC drugs Minor surgery-no risk PT OT IVF without additives    Moderate 1 or more chronic illnesses w/ mild exac, progression or S/E from tx 2 or more stable chronic illnesses Undiagnosed new problem w/ uncertain prognosis Acute complicated injury  Stress tests Endoscopies with no risk factors Deep needle or incisional bx CV imaging without risk LP Thoracentesis Paracentesis Minor surgery w/ risks Elective major surgery w/ no risk (open, percutaneous or endoscopic) Prescription  drugs Therapeutic nucl med IVF with additives Closed tx of fracture/dislocation    High Severe exac of chronic illness Acute or chronic illness/injury may pose a threat to life or bodily function (ARF) Change in neuro status    CV imaging w/ contrast and risk Cardio electophysiologic tests Endoscopies w/ risk Discography Elective major surgery Emergency major surgery Parenteral controlled substances Drug therapy req monitoring for toxicity DNR/de-escalation of care    MDM Prob points Data points Risk   Straightforward    <1    <1    Min   Low complexity    2    2    Low   Moderate    3    3    Mod   High Complexity    4 or more    4 or more    High      LOS: 0 days   Evadne Ose  [facility] Pager [phone]. If unable to reach me by pager, please call my cell phone at [phone].  *Please refer to [email or web address], password [email or web address] to get updated schedule on who will round on this patient, as hospitalists switch teams weekly. If 7PM-7AM, please contact night-coverage at [email or web address], password [email or web address] for any overnight needs.  03/09/2017, 8:00 AM

## 2017-03-09 NOTE — Care Management Obs Status (Signed)
MEDICARE OBSERVATION STATUS NOTIFICATION   Patient Details  Name: Nicole Key MRN: [id number] Date of Birth: 20-Jul-1928   Medicare Observation Status Notification Given:  Yes    Gala LewandowskyGraves-Bigelow, Myles Tavella Kaye, RN 03/09/2017, 4:25 PM

## 2017-03-09 NOTE — Evaluation (Signed)
Clinical/Bedside Swallow Evaluation Patient Details  Name: Nicole Key MRN: [id number] Date of Birth: [birth year]  Today's Date: 03/09/2017 Time: SLP Start Time (ACUTE ONLY): 0956 SLP Stop Time (ACUTE ONLY): 1012 SLP Time Calculation (min) (ACUTE ONLY): 16 min  Past Medical History:  Past Medical History:  Diagnosis Date  . Anxiety   . Arthritis   . Aspiration pneumonia (HCC)   . Fibromyalgia   . GERD (gastroesophageal reflux disease)   . Hypertension   . Incarcerated hiatal hernia with obstruction 02/07/2015  . Insomnia   . Meniere's disease 1989  . Syncope 08/15/2013  . Vertigo    Past Surgical History:  Past Surgical History:  Procedure Laterality Date  . ABDOMINAL HYSTERECTOMY    . APPENDECTOMY    . BILATERAL TOTAL MASTECTOMY WITH AXILLARY LYMPH NODE DISSECTION  1981   HPI:  83 y.o.femalewith medical history significant of anxiety, HTN, GERD, ? esophageal stricture as she underwent dilation in 2005, large hiatal hernia per esophagram in 2016,, wall thickening of the esophagus, old L shoulder injury. Patient presents to the ED with c/o L sided chest pain, radiation to L shoulder, arm, and neck. Pain has been ongoing for 1-2 weeks but worsened significantly today. Associated SOB, tremors.History complicated by the fact that patient also appeared to be having a panic attack in the ED. R/o MI. Acute CXR negative.    Assessment / Plan / Recommendation Clinical Impression  Patient presents with indication of a primary esophageal dysphagia characterized by c/o intermittent globus with solids, likely a result of known large hiatal hernia and suspected esophageal stenosis (s/p dilation in 2005). No overt indication of aspiration observed. Patient largely compensating independently with small bites and sips and thorough mastication of bolus. Re-educated on the importance of also maintaining an upright posture and following solids with liquids to aid in esophageal clearance. SLP  will f/u briefly to re-inforce use of strategies. Discussed with MD. Do not feel patient would benefit from [facility] (ordered) however if MD wishes to r/o esophageal dysphagia as origin for CP, repeat esophagram may be beneficial.  SLP Visit Diagnosis: Dysphagia, unspecified (R13.10)    Aspiration Risk  Mild aspiration risk    Diet Recommendation Regular;Thin liquid   Liquid Administration via: Cup;Straw Medication Administration: Whole meds with liquid Supervision: Patient able to self feed Compensations: Slow rate;Small sips/bites;Follow solids with liquid Postural Changes: Seated upright at 90 degrees;Remain upright for at least 30 minutes after po intake    Other  Recommendations Oral Care Recommendations: Oral care BID   Follow up Recommendations None      Frequency and Duration min 1 x/week  1 week           Swallow Study   General HPI: 83 y.o.femalewith medical history significant of anxiety, HTN, GERD, ? esophageal stricture as she underwent dilation in 2005, large hiatal hernia per esophagram in 2016,, wall thickening of the esophagus, old L shoulder injury. Patient presents to the ED with c/o L sided chest pain, radiation to L shoulder, arm, and neck. Pain has been ongoing for 1-2 weeks but worsened significantly today. Associated SOB, tremors.History complicated by the fact that patient also appeared to be having a panic attack in the ED. R/o MI. Acute CXR negative.  Type of Study: Bedside Swallow Evaluation Previous Swallow Assessment: bedside evaluation in 2016 indicated a primary esophageal dysphagia Diet Prior to this Study: Regular;Thin liquids Temperature Spikes Noted: No Respiratory Status: Room air History of Recent Intubation: No Behavior/Cognition: Alert;Cooperative;Pleasant mood Oral Cavity  Assessment: Within Functional Limits Oral Care Completed by SLP: Recent completion by staff Oral Cavity - Dentition: Dentures, top;Dentures, bottom Vision: Functional  for self-feeding Self-Feeding Abilities: Able to feed self Patient Positioning: Upright in bed Baseline Vocal Quality: Normal Volitional Cough: Strong;Congested Volitional Swallow: Able to elicit    Oral/Motor/Sensory Function Overall Oral Motor/Sensory Function: Within functional limits   Ice Chips Ice chips: Not tested   Thin Liquid Thin Liquid: Within functional limits Presentation: Cup;Self Fed;Straw    Nectar Thick Nectar Thick Liquid: Not tested   Honey Thick Honey Thick Liquid: Not tested   Puree Puree: Within functional limits Presentation: Spoon   Solid   GO   Solid: Within functional limits Presentation: Self Fed    Functional Assessment Tool Used: skilled clinical judgement Functional Limitations: Swallowing Swallow Current Status (B1478(G8996): At least 1 percent but less than 20 percent impaired, limited or restricted Swallow Goal Status [phone](G8997): At least 1 percent but less than 20 percent impaired, limited or restricted  Nicole LangoLeah Adalida Garver MA, CCC-SLP [phone]  Nicole Key Nicole Key 03/09/2017,10:20 AM

## 2017-03-09 NOTE — Evaluation (Signed)
Physical Therapy Evaluation Patient Details Name: Nicole Key MRN: [id number] DOB: 02-02-1928 Today's Date: 03/09/2017   History of Present Illness  Patient is a 83 y/o female who presents with chest pain. Pain has been ongoing for 1-2 weeks but worsened significantly today. Associated SOB, tremors. R/O chest pain workup pending. EKG unremarkable. PMH includes anxiety, depression, HTN, Meniere's disease.  Clinical Impression  Patient presents with generalized weakness, deconditioning, imbalance and impaired mobility s/p above. Tolerated [place] distance ambulation with Min A for balance/safety. Pt with decreased endurance and a few instances of instability requiring assist to regain balance. Pt home with daughter but reports she cannot assist much. Grandson checks on her. Pt high fall risk with hx of falls. Would benefit from [facility] SNF to maximize independence and mobility prior to return home. If pt declines, will need HHPT. Will follow acutely.    Follow Up Recommendations SNF;Supervision for mobility/OOB;Supervision/Assistance - 24 hour (however pt refusing and wanting to go home)    Equipment Recommendations  None recommended by PT    Recommendations for Other Services OT consult     Precautions / Restrictions Precautions Precautions: Fall Restrictions Weight Bearing Restrictions: No      Mobility  Bed Mobility Overal bed mobility: Needs Assistance Bed Mobility: Supine to Sit;Sit to Supine     Supine to sit: Supervision;HOB elevated Sit to supine: Supervision;HOB elevated   General bed mobility comments: Increased time, use of rail but no physical assist needed.   Transfers Overall transfer level: Needs assistance Equipment used: Rolling walker (2 wheeled) Transfers: Sit to/from Stand Sit to Stand: Min guard         General transfer comment: Min guard for safety. Stood from [employer], very slow to stand. Min A for balance initially.  Ambulation/Gait Ambulation/Gait  assistance: Min assist Ambulation Distance (Feet): 40 Feet Assistive device: Rolling walker (2 wheeled) Gait Pattern/deviations: Step-to pattern;Step-through pattern;Decreased stride length;Trunk flexed;Narrow base of support Gait velocity: decreased Gait velocity interpretation: <1.8 ft/sec, indicative of risk for recurrent falls General Gait Details: Slow, unsteady gait with forward flexed posture and RW too far anterior. A few moments of instability requiring Min A to regain balance. 2/4 DOE. HR stable.   Stairs            Wheelchair Mobility    Modified Rankin (Stroke Patients Only)       Balance Overall balance assessment: Needs assistance Sitting-balance support: Feet supported;No upper extremity supported Sitting balance-Leahy Scale: Fair Sitting balance - Comments: Able to reach outside BoS and grab phone on side table without difficulty.    Standing balance support: During functional activity Standing balance-Leahy Scale: Poor Standing balance comment: Requires UE support for balance.                              Pertinent Vitals/Pain Pain Assessment: No/denies pain    Home Living Family/patient expects to be discharged to:: Private residence Living Arrangements: Other relatives;Children (daughter/grandson) Available Help at Discharge: Personal care attendant;Available PRN/intermittently Type of Home: House Home Access: Stairs to enter Entrance Stairs-Rails: Right Entrance Stairs-Number of Steps: 3 Home Layout: Two level Home Equipment: Walker - 2 wheels;Bedside commode;Wheelchair - manual;Cane - single point      Prior Function Level of Independence: Needs assistance   Gait / Transfers Assistance Needed: uses walker, but more unsteady since recent fall  ADL's / Homemaking Assistance Needed: has caregivers to assist with bathing  Hand Dominance        Extremity/Trunk Assessment   Upper Extremity Assessment Upper Extremity  Assessment: Defer to OT evaluation (Hx of shoulder injury)    Lower Extremity Assessment Lower Extremity Assessment: Generalized weakness    Cervical / Trunk Assessment Cervical / Trunk Assessment: Kyphotic  Communication   Communication: No difficulties  Cognition Arousal/Alertness: Awake/alert Behavior During Therapy: WFL for tasks assessed/performed Overall Cognitive Status: Within Functional Limits for tasks assessed                                 General Comments: Questionable safety awareness.      General Comments      Exercises     Assessment/Plan    PT Assessment Patient needs continued PT services  PT Problem List Decreased strength;Decreased mobility;Decreased safety awareness;Decreased balance;Decreased knowledge of use of DME;Decreased activity tolerance;Cardiopulmonary status limiting activity       PT Treatment Interventions Therapeutic activities;Gait training;Therapeutic exercise;Patient/family education;Balance training;Functional mobility training    PT Goals (Current goals can be found in the Care Plan section)  Acute Rehab PT Goals Patient Stated Goal: to feel better PT Goal Formulation: With patient Time For Goal Achievement: 03/23/17 Potential to Achieve Goals: Good    Frequency Min 3X/week   Barriers to discharge Decreased caregiver support stairs    Co-evaluation               AM-PAC PT "6 Clicks" Daily Activity  Outcome Measure Difficulty turning over in bed (including adjusting bedclothes, sheets and blankets)?: None Difficulty moving from lying on back to sitting on the side of the bed? : None Difficulty sitting down on and standing up from a chair with arms (e.g., wheelchair, bedside commode, etc,.)?: None Help needed moving to and from a bed to chair (including a wheelchair)?: A Little Help needed walking in hospital room?: A Little Help needed climbing 3-5 steps with a railing? : A Lot 6 Click Score: 20     End of Session Equipment Utilized During Treatment: Gait belt Activity Tolerance: Patient limited by fatigue Patient left: in bed;with call bell/phone within reach;with bed alarm set Nurse Communication: Mobility status PT Visit Diagnosis: Unsteadiness on feet (R26.81);Muscle weakness (generalized) (M62.81)    Time: [id number] PT Time Calculation (min) (ACUTE ONLY): 16 min   Charges:   PT Evaluation $PT Eval Low Complexity: 1 Procedure     PT G Codes:   PT G-Codes **NOT FOR INPATIENT CLASS** Functional Assessment Tool Used: Clinical judgement Functional Limitation: Mobility: Walking and moving around Mobility: Walking and Moving Around Current Status (Q6578(G8978): At least 20 percent but less than 40 percent impaired, limited or restricted Mobility: Walking and Moving Around Goal Status [phone](G8979): At least 1 percent but less than 20 percent impaired, limited or restricted    NorotonShauna Oluwatobi Visser, [place]PT, [place]PT [phone]    Nicole PanningShauna A Jeilyn Reznik 03/09/2017, 2:42 PM

## 2017-03-09 NOTE — Progress Notes (Signed)
Initial Nutrition Assessment  DOCUMENTATION CODES:   Severe malnutrition in context of chronic illness, Underweight  INTERVENTION:    Ensure Enlive po TID, each supplement provides 350 kcal and 20 grams of protein  NUTRITION DIAGNOSIS:   Malnutrition (severe) related to chronic illness (? hiatal hernia with obstruction, anxiety, fibromyalgia) as evidenced by severe depletion of muscle mass, severe depletion of body fat.  GOAL:   Patient will meet greater than or equal to 90% of their needs  MONITOR:   PO intake, Supplement acceptance  REASON FOR ASSESSMENT:   Malnutrition Screening Tool, Consult Assessment of nutrition requirement/status  ASSESSMENT:   83 yo female with PMH of fibromyalgia, Meniere's DZ, HTN, anxiety, vertigo, GERD, syncope, hiatal hernia with obstruction, aspiration PNA who was admitted on 7/12 with worsening L sided chest pain.  Patient reports that she has lost a lot of weight; she used to weight 130 lbs years ago. Per review of more recent weights, she has not lost weight. She is underweight with BMI=15.8. She likes Ensure supplements, but is tired of the vanilla flavored ones.  Nutrition-Focused physical exam completed. Findings are severe fat depletion, severe muscle depletion, and no edema.  Labs and medications reviewed.  Diet Order:  Diet Heart Room service appropriate? Yes; Fluid consistency: Thin  Skin:  Reviewed, no issues  Last BM:  7/11  Height:   Ht Readings from Last 1 Encounters:  03/09/17 5\' 8"  (1.727 m)    Weight:   Wt Readings from Last 1 Encounters:  03/09/17 103 lb 12.8 oz (47.1 kg)    Ideal Body Weight:  63.6 kg  BMI:  Body mass index is 15.78 kg/m.  Estimated Nutritional Needs:   Kcal:  1300-1500  Protein:  65-75 gm  Fluid:  1.5 L  EDUCATION NEEDS:   No education needs identified at this time  Joaquin CourtsKimberly Harris, RD, LDN, CNSC Pager [phone] After Hours Pager [phone]

## 2017-03-10 DIAGNOSIS — R1310 Dysphagia, unspecified: Secondary | ICD-10-CM

## 2017-03-10 DIAGNOSIS — R079 Chest pain, unspecified: Secondary | ICD-10-CM | POA: Diagnosis not present

## 2017-03-10 DIAGNOSIS — I679 Cerebrovascular disease, unspecified: Secondary | ICD-10-CM

## 2017-03-10 DIAGNOSIS — E43 Unspecified severe protein-calorie malnutrition: Secondary | ICD-10-CM

## 2017-03-10 DIAGNOSIS — K449 Diaphragmatic hernia without obstruction or gangrene: Secondary | ICD-10-CM

## 2017-03-10 DIAGNOSIS — I10 Essential (primary) hypertension: Secondary | ICD-10-CM

## 2017-03-10 DIAGNOSIS — N179 Acute kidney failure, unspecified: Secondary | ICD-10-CM | POA: Diagnosis not present

## 2017-03-10 LAB — URINE CULTURE

## 2017-03-10 LAB — BASIC METABOLIC PANEL
Anion gap: 9 (ref 5–15)
BUN: 5 mg/dL — AB (ref 6–20)
CHLORIDE: 107 mmol/L (ref 101–111)
CO2: 23 mmol/L (ref 22–32)
CREATININE: 0.56 mg/dL (ref 0.44–1.00)
Calcium: 8.8 mg/dL — ABNORMAL LOW (ref 8.9–10.3)
GFR calc Af Amer: >60 mL/min (ref >60)
GFR calc non Af Amer: >60 mL/min (ref >60)
GLUCOSE: 126 mg/dL — AB (ref 65–99)
Potassium: 3.8 mmol/L (ref 3.5–5.1)
SODIUM: 139 mmol/L (ref 135–145)

## 2017-03-10 MED ORDER — DILTIAZEM HCL 30 MG PO TABS: 30.0000 mg | ORAL_TABLET | Freq: Three times a day (TID) | ORAL | 0 refills | Status: DC

## 2017-03-10 NOTE — Discharge Summary (Signed)
Physician Discharge Summary  Nicole Key ZOX:[id number] DOB: 1928-05-17 DOA: 03/08/2017  PCP: Geoffry Paradise, MD  Admit date: 03/08/2017 Discharge date: 03/10/2017  Admitted From: Home Disposition: Home   Recommendations for Outpatient Follow-up:  1. Follow up with PCP in 1-2 weeks. ACS ruled out as cause of chest/left shoulder/neck pain suspected to be due to refrred pain from large hiatal hernia. Unfortunately, not a surgical candidate.  2. Started diltiazem for elevated SBP and attempt to treat esophageal spasm.  3. Please obtain BMP to monitor renal function at follow up  Home Health: SNF recommended, pt declined SNF and home health; to continue private pay aide. Equipment/Devices: None new Discharge Condition: Stable CODE STATUS: Full Diet recommendation: As tolerated  Brief/Interim Summary: Nicole Key is an 83 y.o. female with a PMH of large hiatal hernia with history of incarcerated hiatal hernia with obstruction dating back to 01/2015 (has been on twice a day PPI since that time), anxiety, hypertension, GERD and fibromyalgia who was admitted 03/08/17 with a chief complaint of a 1-2 week history of left-sided chest pain radiating to the left shoulder, arm and neck that worsened 24 hours prior to admission. Was given 0.5 mg of IV Ativan in the ED for a panic attack. EKG was unremarkable and initial troponin was negative. Cardiac enzymes remained negative and chest pain resolved.   Left neck/shoulder pain which is chronic, continued. Investigation with MRI of the cervical spine revealed only mild-for-age cervical spondylosis. It is felt this represents referred pain related to large hiatal hernia. A diagnostic barium swallow study was performed suggestive of esophagitis with low grade dysphagia related to large hiatal hernia without definite stricture.   A review of her prior records reveals that she was discharged 10/19/16 where it was noted that her PCP has been working on her  left upper epigastric and left-sided chest pain for 3 years. She was thought to have a somatization disorder and polypharmacy during that admission. She was admitted for a incarcerated hiatal hernia with obstruction 01/2015 complicated by aspiration pneumonia and it appears surgery was recommended. She was deemed not a surgical candidate at that time due to poor functional status and age, prohibitive risks of surgery.   Discharge Diagnoses:  Principal Problem:   Chest pain, rule out acute myocardial infarction Active Problems:   Depression   Anxiety   Essential hypertension   GERD (gastroesophageal reflux disease)   Protein-calorie malnutrition, severe (HCC)   Hiatal hernia   AKI (acute kidney injury) (HCC)   Severely underweight adult   Abnormal weight loss   Cerebrovascular disease   Globus sensation   Dysphagia  Chest pain/Neck pain, rule out acute myocardial infarction - Troponins negative 3. ECG nonischemic, NSR with RAD and mild prolonged QT interval. - Patient noted to be having a panic attack in the ED, so it is possible that this is related to severe anxiety. Recommend continuing treatment for this. She had a 2-D echocardiogram 08/15/13 which showed mild LVH and a normal EF.  - Chest x-ray and shoulder films show no acute findings.  - Check MRI of the C-spine: Mild spondylosis only. It appears her PCP has been working on this issue for some time. I suspect that her symptoms are chronic and related to her large hiatal hernia rather than underlying cardiac disease.  GERD (gastroesophageal reflux disease)/hiatal hernia/dysphasia with a globus sensation Given weight loss, reports of nausea and pain in epigastric area radiating to shoulder, LFTs and lipase checked, normal. - Continue BID  PPI therapy.  - Discussed lifestyle modifications at length with patient and family. - Chest and neck pain may be from esophageal spasms in the setting of a large hiatal hernia. Had speech  evaluation and swallow study (see below). Unfortunately, she has been evaluated by surgery in the past but was deemed too weak to have a repair done of her large hiatal hernia. She was supposed to follow-up with surgery as an outpatient.    Acute kidney injury Severely impaired renal function noted with creatinine greater than 2 times normal and an estimated GFR of 21.8 at admission. Baseline creatinine is 0.6-0.8.  - Resolved with normal renal function at discharge, suspect due to dehydration - Initially given ceftriaxone for pyuria, though pt denies symptoms and culture nonclonal, so will DC abx.     Depression/ Anxiety: Worsening - Patient is only on Xanax as needed.  - May benefit from SSRI therapy.    Essential hypertension/chronic cerebrovascular disease: Stable. Systolic pressure is mildly elevated. - Continue metoprolol.  - Added Cardizem which may help if she is having esophageal spasms.    Protein-calorie malnutrition, severe (HCC)/underweight/abnormal weight loss: Body mass index is 15.78 kg/m. Dietitian consultation requested. - Continue high protein meals as tolerated.   Discharge Instructions Discharge Instructions    Discharge instructions    Complete by:  As directed    You were admitted for chest pain and shoulder/neck pain that is not due to a heart attack. The most likely cause is the hiatal hernia, so the treatment for this is as follows:  - Keep eating small meals and sitting completely upright when you eat. - Move around, don't just stay in bed, but use assistance to walk at least a couple times a day.  - Start taking diltiazem three times a day to help with blood pressure and to treat spasms of the esophagus which may be causing the pain.  - Continue taking all other medications as below, but try to avoid using ibuprofen/advil as this can make your pain worse by eroding into the stomach lining.  - Follow up with Dr. Jacky Kindle in the next 1 - 2 weeks, or seek  medical attention sooner if you notice worsening chest pain, trouble breathing or fever.     Allergies as of 03/10/2017      Reactions   Shellfish Allergy Other (See Comments)   unknown      Medication List    STOP taking these medications   ibuprofen 200 MG tablet Commonly known as:  ADVIL,MOTRIN     TAKE these medications   acetaminophen 325 MG tablet Commonly known as:  TYLENOL Take 325-650 mg by mouth every 6 (six) hours as needed for mild pain.   ALPRAZolam 0.25 MG tablet Commonly known as:  XANAX Take 1 tablet (0.25 mg total) by mouth at bedtime as needed (anxiety).   ASPERCREME LIDOCAINE EX Apply 1 application topically 3 (three) times daily.   diltiazem 30 MG tablet Commonly known as:  CARDIZEM Take 1 tablet (30 mg total) by mouth 3 (three) times daily.   HYDROcodone-acetaminophen 5-325 MG tablet Commonly known as:  NORCO/VICODIN Take 1 tablet by mouth every 4 (four) hours as needed for moderate pain.   hyoscyamine 0.125 MG tablet Commonly known as:  LEVSIN, ANASPAZ Take 0.125 mg by mouth every 4 (four) hours as needed for bladder spasms or cramping.   meclizine 25 MG tablet Commonly known as:  ANTIVERT Take 25 mg by mouth 3 (three) times daily as needed  for dizziness.   metoprolol tartrate 25 MG tablet Commonly known as:  LOPRESSOR Take 1 tablet (25 mg total) by mouth 2 (two) times daily.   pantoprazole 40 MG tablet Commonly known as:  PROTONIX Take 1 tablet (40 mg total) by mouth 2 (two) times daily before a meal.   traZODone 50 MG tablet Commonly known as:  DESYREL Take 1 tablet (50 mg total) by mouth at bedtime as needed for sleep.   VITAMIN B 12 PO Take 1 tablet by mouth daily.      Follow-up Information    Geoffry ParadiseAronson, Richard, MD. Schedule an appointment as soon as possible for a visit in 1 week(s).   Specialty:  Internal Medicine Contact information: [address] [place] [address] [phone]          Allergies  Allergen  Reactions  . Shellfish Allergy Other (See Comments)    unknown    Consultations:  None  Procedures/Studies: Dg Chest 2 View  Result Date: 03/08/2017 CLINICAL DATA:  Chest pain. EXAM: CHEST  2 VIEW COMPARISON:  Radiograph of October 18, 2016. FINDINGS: Stable cardiomediastinal silhouette. No pneumothorax or pleural effusion is noted. Stable large hiatal hernia is noted. No acute pulmonary disease is noted. Bony thorax is unremarkable. IMPRESSION: No active cardiopulmonary disease.  Stable large hiatal hernia. Electronically Signed   By: Lupita RaiderJames  Green Jr, M.D.   On: 03/08/2017 15:48   Mr Cervical Spine Wo Contrast  Result Date: 03/09/2017 CLINICAL DATA:  83 y/o F; neck pain radiating to left chest and shoulder. EXAM: MRI CERVICAL SPINE WITHOUT CONTRAST TECHNIQUE: Multiplanar, multisequence MR imaging of the cervical spine was performed. No intravenous contrast was administered. COMPARISON:  02/03/2015 CT cervical spine. FINDINGS: Alignment: Normal cervical lordosis without listhesis. Vertebrae: Right-sided C3-C5 and left-sided C4-C6 trace facet effusions, likely degenerative. No significant bone marrow edema. Cord: Normal signal and morphology. Posterior Fossa, vertebral arteries, paraspinal tissues: Negative. Disc levels: C2-3: No significant disc displacement, foraminal narrowing, or canal stenosis. Mild right greater than left facet hypertrophy. C3-4: Small disc osteophyte complex and mild bilateral facet hypertrophy. No significant foraminal or canal stenosis. C4-5: Small disc osteophyte complex and mild bilateral facet hypertrophy. No significant foraminal or canal stenosis. C5-6: Small disc osteophyte complex and mild bilateral facet hypertrophy. No significant foraminal or canal stenosis. C6-7: Small disc osteophyte complex with central annular fissure and mild bilateral facet hypertrophy. No significant foraminal or canal stenosis. C7-T1: No significant disc displacement, foraminal narrowing,  or canal stenosis. IMPRESSION: Mild cervical spondylosis for age. No significant foraminal or canal stenosis. No abnormal cord or bone marrow signal. Electronically Signed   By: Mitzi HansenLance  Furusawa-Stratton M.D.   On: 03/09/2017 13:48   Dg Esophagus  Result Date: 03/09/2017 CLINICAL DATA:  Sensation of food getting stuck.  Hiatal hernia. EXAM: ESOPHOGRAM/BARIUM SWALLOW TECHNIQUE: Single contrast examination was performed using  thin barium. FLUOROSCOPY TIME:  Fluoroscopy Time:  1 minutes, 24 seconds Radiation Exposure Index (if provided by the fluoroscopic device): 10.6 mGy Number of Acquired Spot Images: 1 COMPARISON:  Chest CT 02/04/2015 FINDINGS: The patient is infirm and has limited mobility. Today's exam was conducted with the patient laying supine in the posterior oblique positions. Secondary and tertiary contractions were noted in the esophagus with some corkscrew esophagus appearance due to spasm. However, this was generally transient. There is a very large hiatal hernia containing almost the entire stomach, which is flipped on its axis so that the lesser curvature is oriented upwards in the chest, as shown on  series 6. As result, the gastroesophageal junction is not visualized in an ideal fashion, as it is partially blocked by the stomach itself which quickly filled with barium. However, I do not see a definite stricture or narrowing. There is abnormal fold thickening in the esophagus, for example along the mucosal relief imaging of the upper esophagus on image 10/5, suggesting esophagitis. Some of this appearance could be due to prominent paraesophageal venous structures. Despite the organoaxial rotation of the stomach, contrast medium is noted to extend into the duodenal bulb on series 6. IMPRESSION: 1. Esophageal fold thickening suggesting esophagitis. 2. There is at least low-grade dysphagia, with secondary and tertiary contractions, and some stasis of contrast in the esophagus, although generally  effective primary peristaltic waves. 3. Very large hiatal hernia containing almost the entire stomach, which demonstrates organoaxial rotation with the lesser curvature oriented cephalad. 4. I do not see definite stricture in the esophagus. Because of patient mobility issues and the contrast in the stomach blocking the distal esophagus, the gastroesophageal junction is partially obscured which can reduce negative predictive value. Electronically Signed   By: Gaylyn Rong M.D.   On: 03/09/2017 13:41   Dg Shoulder Left  Result Date: 03/08/2017 CLINICAL DATA:  Pain. EXAM: LEFT SHOULDER - 2+ VIEW COMPARISON:  None. FINDINGS: Limited views left chest are normal. No fracture identified. The transscapular Y views are limited but the axillary view demonstrates no evidence of dislocation. Degenerative changes are noted. IMPRESSION: Degenerative changes.  No fracture or dislocation. Electronically Signed   By: Gerome Sam III M.D   On: 03/08/2017 20:41   Subjective: Pain is controlled and currently absent in left chest/shoulder/neck. No dyspnea. Has been taking good per oral intake, better than usual.   Discharge Exam: Vitals:   03/10/17 0243 03/10/17 0602  BP: (!) 172/79 (!) 172/72  Pulse: 72 69  Resp: 16 (!) 21  Temp: 97.8 F (36.6 C) 98.4 F (36.9 C)   General: Frail, elderly female in no distress Cardiovascular: RRR, S1/S2 +, no rubs, no gallops Respiratory: CTA bilaterally, no wheezing, no rhonchi Abdominal: Soft, NT, ND, bowel sounds + Extremities: No edema. Left arm/shoulder with full ROM nontender.   Labs: Basic Metabolic Panel:  Recent Labs Lab 03/08/17 1530 03/10/17 1252  NA 139 139  K 4.6 3.8  CL 100* 107  CO2 28 23  GLUCOSE 118* 126*  BUN 16 5*  CREATININE 1.38* 0.56  CALCIUM 9.0 8.8*   Liver Function Tests:  Recent Labs Lab 03/09/17 0924  AST 15  ALT 8*  ALKPHOS 54  BILITOT 0.4  PROT 5.8*  ALBUMIN 3.0*    Recent Labs Lab 03/09/17 0924  LIPASE 19    CBC:  Recent Labs Lab 03/08/17 1530  WBC 12.9*  HGB 14.0  HCT 41.8  MCV 91.3  PLT 272   Cardiac Enzymes:  Recent Labs Lab 03/08/17 2237 03/09/17 0115 03/09/17 0254  TROPONINI <0.03 <0.03 <0.03   Urinalysis    Component Value Date/Time   COLORURINE YELLOW 03/08/2017 1935   APPEARANCEUR CLEAR 03/08/2017 1935   LABSPEC 1.014 03/08/2017 1935   PHURINE 6.0 03/08/2017 1935   GLUCOSEU NEGATIVE 03/08/2017 1935   HGBUR NEGATIVE 03/08/2017 1935   BILIRUBINUR NEGATIVE 03/08/2017 1935   KETONESUR NEGATIVE 03/08/2017 1935   PROTEINUR NEGATIVE 03/08/2017 1935   UROBILINOGEN 0.2 02/03/2015 1928   NITRITE NEGATIVE 03/08/2017 1935   LEUKOCYTESUR MODERATE (A) 03/08/2017 1935    Microbiology Recent Results (from the past 240 hour(s))  Culture, Urine  Status: Abnormal   Collection Time: 03/08/17  7:35 PM  Result Value Ref Range Status   Specimen Description URINE, RANDOM  Final   Special Requests NONE  Final   Culture MULTIPLE SPECIES PRESENT, SUGGEST RECOLLECTION (A)  Final   Report Status 03/10/2017 FINAL  Final    Time coordinating discharge: Approximately 40 minutes  Hazeline Junker, MD  [facility] 03/10/2017, 3:59 PM Pager [phone]

## 2017-03-10 NOTE — Plan of Care (Signed)
Problem: Pain Managment: Goal: General experience of comfort will improve Outcome: Progressing No c/o pain or discomfort this shift. Patient encouraged to notify the nurse when in pain.

## 2017-03-10 NOTE — Care Management Note (Signed)
Case Management Note  Patient Details  Name: Nicole Key MRN: [id number] Date of Birth: 1927/12/08  Subjective/Objective:                 Patient with order to DC to home today. Chart reviewed. No Home Health or Equipment needs, no unacknowledged Case Management consults or medication needs identified at the time of this note. Plan for DC to home. Spoke with patient she states she id from home w family and is declining SNF and HH services.  If needs arise today prior to discharge, please call Lawerance SabalDebbie Phil Michels RN CM at [phone].    Action/Plan:   Expected Discharge Date:  03/10/17               Expected Discharge Plan:  Home/Self Care  In-House Referral:     Discharge planning Services  CM Consult  Post Acute Care Choice:    Choice offered to:     DME Arranged:    DME Agency:     HH Arranged:  Refused SNF, [facility]:     Status of Service:  Completed, signed off  If discussed at [employer] Length of Stay Meetings, dates discussed:    Additional Comments:  Lawerance SabalDebbie Silvester Reierson, RN 03/10/2017, 12:36 PM

## 2017-09-01 ENCOUNTER — Encounter (HOSPITAL_COMMUNITY): Payer: Self-pay | Admitting: *Deleted

## 2017-09-01 ENCOUNTER — Inpatient Hospital Stay (HOSPITAL_COMMUNITY)
Admission: EM | Admit: 2017-09-01 | Discharge: 2017-09-14 | DRG: 871 | Disposition: A | Discharge status: Home Health | Payer: Medicare Other | Attending: Internal Medicine | Admitting: Internal Medicine

## 2017-09-01 ENCOUNTER — Emergency Department (HOSPITAL_COMMUNITY): Payer: Medicare Other

## 2017-09-01 ENCOUNTER — Inpatient Hospital Stay (HOSPITAL_COMMUNITY): Payer: Medicare Other

## 2017-09-01 ENCOUNTER — Other Ambulatory Visit: Payer: Self-pay

## 2017-09-01 DIAGNOSIS — N179 Acute kidney failure, unspecified: Secondary | ICD-10-CM | POA: Diagnosis present

## 2017-09-01 DIAGNOSIS — Z9889 Other specified postprocedural states: Secondary | ICD-10-CM

## 2017-09-01 DIAGNOSIS — R0600 Dyspnea, unspecified: Secondary | ICD-10-CM | POA: Diagnosis not present

## 2017-09-01 DIAGNOSIS — F419 Anxiety disorder, unspecified: Secondary | ICD-10-CM | POA: Diagnosis present

## 2017-09-01 DIAGNOSIS — R059 Cough, unspecified: Secondary | ICD-10-CM

## 2017-09-01 DIAGNOSIS — R000 Tachycardia, unspecified: Secondary | ICD-10-CM | POA: Diagnosis present

## 2017-09-01 DIAGNOSIS — D72829 Elevated white blood cell count, unspecified: Secondary | ICD-10-CM | POA: Diagnosis not present

## 2017-09-01 DIAGNOSIS — R0602 Shortness of breath: Secondary | ICD-10-CM | POA: Diagnosis not present

## 2017-09-01 DIAGNOSIS — R197 Diarrhea, unspecified: Secondary | ICD-10-CM | POA: Diagnosis not present

## 2017-09-01 DIAGNOSIS — E860 Dehydration: Secondary | ICD-10-CM | POA: Diagnosis present

## 2017-09-01 DIAGNOSIS — K922 Gastrointestinal hemorrhage, unspecified: Secondary | ICD-10-CM | POA: Diagnosis present

## 2017-09-01 DIAGNOSIS — E43 Unspecified severe protein-calorie malnutrition: Secondary | ICD-10-CM | POA: Diagnosis present

## 2017-09-01 DIAGNOSIS — E872 Acidosis: Secondary | ICD-10-CM | POA: Diagnosis present

## 2017-09-01 DIAGNOSIS — K449 Diaphragmatic hernia without obstruction or gangrene: Secondary | ICD-10-CM | POA: Diagnosis present

## 2017-09-01 DIAGNOSIS — R7989 Other specified abnormal findings of blood chemistry: Secondary | ICD-10-CM | POA: Diagnosis not present

## 2017-09-01 DIAGNOSIS — J189 Pneumonia, unspecified organism: Secondary | ICD-10-CM | POA: Diagnosis present

## 2017-09-01 DIAGNOSIS — J90 Pleural effusion, not elsewhere classified: Secondary | ICD-10-CM | POA: Diagnosis present

## 2017-09-01 DIAGNOSIS — Z515 Encounter for palliative care: Secondary | ICD-10-CM

## 2017-09-01 DIAGNOSIS — J9601 Acute respiratory failure with hypoxia: Secondary | ICD-10-CM | POA: Diagnosis not present

## 2017-09-01 DIAGNOSIS — E8809 Other disorders of plasma-protein metabolism, not elsewhere classified: Secondary | ICD-10-CM | POA: Diagnosis not present

## 2017-09-01 DIAGNOSIS — E876 Hypokalemia: Secondary | ICD-10-CM | POA: Diagnosis present

## 2017-09-01 DIAGNOSIS — M797 Fibromyalgia: Secondary | ICD-10-CM | POA: Diagnosis present

## 2017-09-01 DIAGNOSIS — E46 Unspecified protein-calorie malnutrition: Secondary | ICD-10-CM

## 2017-09-01 DIAGNOSIS — D500 Iron deficiency anemia secondary to blood loss (chronic): Secondary | ICD-10-CM | POA: Diagnosis present

## 2017-09-01 DIAGNOSIS — K920 Hematemesis: Secondary | ICD-10-CM | POA: Diagnosis present

## 2017-09-01 DIAGNOSIS — R05 Cough: Secondary | ICD-10-CM

## 2017-09-01 DIAGNOSIS — K559 Vascular disorder of intestine, unspecified: Secondary | ICD-10-CM | POA: Diagnosis present

## 2017-09-01 DIAGNOSIS — N390 Urinary tract infection, site not specified: Secondary | ICD-10-CM | POA: Diagnosis present

## 2017-09-01 DIAGNOSIS — E861 Hypovolemia: Secondary | ICD-10-CM | POA: Diagnosis present

## 2017-09-01 DIAGNOSIS — A419 Sepsis, unspecified organism: Principal | ICD-10-CM | POA: Diagnosis present

## 2017-09-01 DIAGNOSIS — Z9189 Other specified personal risk factors, not elsewhere classified: Secondary | ICD-10-CM | POA: Diagnosis not present

## 2017-09-01 DIAGNOSIS — R64 Cachexia: Secondary | ICD-10-CM | POA: Diagnosis not present

## 2017-09-01 DIAGNOSIS — R112 Nausea with vomiting, unspecified: Secondary | ICD-10-CM | POA: Diagnosis not present

## 2017-09-01 DIAGNOSIS — I10 Essential (primary) hypertension: Secondary | ICD-10-CM | POA: Diagnosis present

## 2017-09-01 DIAGNOSIS — R6520 Severe sepsis without septic shock: Secondary | ICD-10-CM | POA: Diagnosis present

## 2017-09-01 DIAGNOSIS — G9340 Encephalopathy, unspecified: Secondary | ICD-10-CM | POA: Diagnosis present

## 2017-09-01 DIAGNOSIS — H8109 Meniere's disease, unspecified ear: Secondary | ICD-10-CM | POA: Diagnosis present

## 2017-09-01 DIAGNOSIS — K921 Melena: Secondary | ICD-10-CM | POA: Diagnosis present

## 2017-09-01 DIAGNOSIS — Z7189 Other specified counseling: Secondary | ICD-10-CM

## 2017-09-01 DIAGNOSIS — Z90710 Acquired absence of both cervix and uterus: Secondary | ICD-10-CM

## 2017-09-01 DIAGNOSIS — K219 Gastro-esophageal reflux disease without esophagitis: Secondary | ICD-10-CM | POA: Diagnosis present

## 2017-09-01 DIAGNOSIS — Z681 Body mass index (BMI) 19 or less, adult: Secondary | ICD-10-CM | POA: Diagnosis not present

## 2017-09-01 DIAGNOSIS — Z915 Personal history of self-harm: Secondary | ICD-10-CM

## 2017-09-01 DIAGNOSIS — K210 Gastro-esophageal reflux disease with esophagitis: Secondary | ICD-10-CM | POA: Diagnosis present

## 2017-09-01 DIAGNOSIS — D649 Anemia, unspecified: Secondary | ICD-10-CM | POA: Diagnosis present

## 2017-09-01 LAB — CBC
HCT: 38.4 % (ref 36.0–46.0)
HCT: 32.4 % — ABNORMAL LOW (ref 36.0–46.0)
HCT: 29.8 % — ABNORMAL LOW (ref 36.0–46.0)
HEMOGLOBIN: 10.4 g/dL — AB (ref 12.0–15.0)
HEMOGLOBIN: 9.7 g/dL — AB (ref 12.0–15.0)
Hemoglobin: 11.9 g/dL — ABNORMAL LOW (ref 12.0–15.0)
MCH: 29.8 pg (ref 26.0–34.0)
MCH: 29.7 pg (ref 26.0–34.0)
MCH: 30.4 pg (ref 26.0–34.0)
MCHC: 31 g/dL (ref 30.0–36.0)
MCHC: 32.1 g/dL (ref 30.0–36.0)
MCHC: 32.6 g/dL (ref 30.0–36.0)
MCV: 96.2 fL (ref 78.0–100.0)
MCV: 92.6 fL (ref 78.0–100.0)
MCV: 93.4 fL (ref 78.0–100.0)
PLATELETS: 457 10*3/uL — AB (ref 150–400)
PLATELETS: 344 10*3/uL (ref 150–400)
Platelets: 381 10*3/uL (ref 150–400)
RBC: 3.99 MIL/uL (ref 3.87–5.11)
RBC: 3.5 MIL/uL — AB (ref 3.87–5.11)
RBC: 3.19 MIL/uL — AB (ref 3.87–5.11)
RDW: 15.1 % (ref 11.5–15.5)
RDW: 15 % (ref 11.5–15.5)
RDW: 15.6 % — ABNORMAL HIGH (ref 11.5–15.5)
WBC: 42.2 10*3/uL — ABNORMAL HIGH (ref 4.0–10.5)
WBC: 32.5 10*3/uL — ABNORMAL HIGH (ref 4.0–10.5)
WBC: 34.6 10*3/uL — AB (ref 4.0–10.5)

## 2017-09-01 LAB — COMPREHENSIVE METABOLIC PANEL
ALT: 19 U/L (ref 14–54)
AST: 47 U/L — AB (ref 15–41)
Albumin: 3.1 g/dL — ABNORMAL LOW (ref 3.5–5.0)
Alkaline Phosphatase: 62 U/L (ref 38–126)
Anion gap: 27 — ABNORMAL HIGH (ref 5–15)
BILIRUBIN TOTAL: 1 mg/dL (ref 0.3–1.2)
BUN: 38 mg/dL — AB (ref 6–20)
CHLORIDE: 93 mmol/L — AB (ref 101–111)
CO2: 21 mmol/L — ABNORMAL LOW (ref 22–32)
CREATININE: 1.56 mg/dL — AB (ref 0.44–1.00)
Calcium: 9 mg/dL (ref 8.9–10.3)
GFR, EST AFRICAN AMERICAN: 33 mL/min — AB (ref >60)
GFR, EST NON AFRICAN AMERICAN: 29 mL/min — AB (ref >60)
Glucose, Bld: 212 mg/dL — ABNORMAL HIGH (ref 65–99)
Potassium: 2.9 mmol/L — ABNORMAL LOW (ref 3.5–5.1)
Sodium: 141 mmol/L (ref 135–145)
TOTAL PROTEIN: 6.5 g/dL (ref 6.5–8.1)

## 2017-09-01 LAB — URINALYSIS, ROUTINE W REFLEX MICROSCOPIC
BILIRUBIN URINE: NEGATIVE
GLUCOSE, UA: NEGATIVE mg/dL
KETONES UR: 5 mg/dL — AB
LEUKOCYTES UA: NEGATIVE
NITRITE: NEGATIVE
PH: 5 (ref 5.0–8.0)
Protein, ur: 100 mg/dL — AB
SPECIFIC GRAVITY, URINE: 1.02 (ref 1.005–1.030)

## 2017-09-01 LAB — GASTROINTESTINAL PANEL BY PCR, STOOL (REPLACES STOOL CULTURE)
Adenovirus F40/41: NOT DETECTED
Astrovirus: NOT DETECTED
CAMPYLOBACTER SPECIES: NOT DETECTED
Cryptosporidium: NOT DETECTED
Cyclospora cayetanensis: NOT DETECTED
ENTEROTOXIGENIC E COLI (ETEC): NOT DETECTED
Entamoeba histolytica: NOT DETECTED
Enteroaggregative E coli (EAEC): NOT DETECTED
Enteropathogenic E coli (EPEC): NOT DETECTED
Giardia lamblia: NOT DETECTED
NOROVIRUS GI/GII: NOT DETECTED
PLESIMONAS SHIGELLOIDES: NOT DETECTED
ROTAVIRUS A: NOT DETECTED
SALMONELLA SPECIES: NOT DETECTED
SAPOVIRUS (I, II, IV, AND V): NOT DETECTED
SHIGA LIKE TOXIN PRODUCING E COLI (STEC): NOT DETECTED
SHIGELLA/ENTEROINVASIVE E COLI (EIEC): NOT DETECTED
Vibrio cholerae: NOT DETECTED
Vibrio species: NOT DETECTED
Yersinia enterocolitica: NOT DETECTED

## 2017-09-01 LAB — C DIFFICILE QUICK SCREEN W PCR REFLEX
C DIFFICILE (CDIFF) TOXIN: NEGATIVE
C DIFFICLE (CDIFF) ANTIGEN: NEGATIVE
C Diff interpretation: NOT DETECTED

## 2017-09-01 LAB — DIFFERENTIAL
Basophils Absolute: 0 10*3/uL (ref 0.0–0.1)
Basophils Relative: 0 %
Eosinophils Absolute: 0 10*3/uL (ref 0.0–0.7)
Eosinophils Relative: 0 %
LYMPHS PCT: 4 %
Lymphs Abs: 1.2 10*3/uL (ref 0.7–4.0)
MONO ABS: 2.4 10*3/uL — AB (ref 0.1–1.0)
Monocytes Relative: 7 %
NEUTROS ABS: 29.2 10*3/uL — AB (ref 1.7–7.7)
Neutrophils Relative %: 89 %

## 2017-09-01 LAB — LACTIC ACID, PLASMA
LACTIC ACID, VENOUS: 4.9 mmol/L — AB (ref 0.5–1.9)
Lactic Acid, Venous: 4.7 mmol/L (ref 0.5–1.9)

## 2017-09-01 LAB — POC OCCULT BLOOD, ED
FECAL OCCULT BLD: NEGATIVE
Fecal Occult Bld: POSITIVE — AB

## 2017-09-01 LAB — I-STAT TROPONIN, ED: TROPONIN I, POC: 0.02 ng/mL (ref 0.00–0.08)

## 2017-09-01 LAB — BRAIN NATRIURETIC PEPTIDE: B Natriuretic Peptide: 275 pg/mL — ABNORMAL HIGH (ref 0.0–100.0)

## 2017-09-01 LAB — PROTIME-INR
INR: 1.12
INR: 1.28
PROTHROMBIN TIME: 14.3 s (ref 11.4–15.2)
PROTHROMBIN TIME: 15.8 s — AB (ref 11.4–15.2)

## 2017-09-01 LAB — LIPASE, BLOOD: LIPASE: 27 U/L (ref 11–51)

## 2017-09-01 LAB — I-STAT CG4 LACTIC ACID, ED
Lactic Acid, Venous: 12.68 mmol/L (ref 0.5–1.9)
Lactic Acid, Venous: 4.87 mmol/L (ref 0.5–1.9)

## 2017-09-01 LAB — ABO/RH: ABO/RH(D): O POS

## 2017-09-01 MED ORDER — METOPROLOL TARTRATE 5 MG/5ML IV SOLN
5.0000 mg | Freq: Four times a day (QID) | INTRAVENOUS | Status: DC | PRN
Start: 2017-09-01 — End: 2017-09-05
  Administered 2017-09-01 – 2017-09-02 (×3): 5 mg via INTRAVENOUS
  Filled 2017-09-01 (×3): qty 5

## 2017-09-01 MED ORDER — PIPERACILLIN-TAZOBACTAM IN DEX 2-0.25 GM/50ML IV SOLN
2.2500 g | Freq: Three times a day (TID) | INTRAVENOUS | Status: DC
  Administered 2017-09-01 – 2017-09-03 (×6): 2.25 g via INTRAVENOUS
  Filled 2017-09-01 (×7): qty 50

## 2017-09-01 MED ORDER — POTASSIUM CHLORIDE 10 MEQ/100ML IV SOLN
10.0000 meq | Freq: Once | INTRAVENOUS | Status: AC
  Administered 2017-09-01: 10 meq via INTRAVENOUS
  Filled 2017-09-01: qty 100

## 2017-09-01 MED ORDER — ONDANSETRON HCL 4 MG/2ML IJ SOLN
4.0000 mg | INTRAMUSCULAR | Status: AC
  Administered 2017-09-01 – 2017-09-02 (×6): 4 mg via INTRAVENOUS
  Filled 2017-09-01 (×6): qty 2

## 2017-09-01 MED ORDER — PANTOPRAZOLE SODIUM 40 MG IV SOLR
80.0000 mg | Freq: Once | INTRAVENOUS | Status: AC
  Administered 2017-09-01: 80 mg via INTRAVENOUS
  Filled 2017-09-01: qty 80

## 2017-09-01 MED ORDER — MORPHINE SULFATE (PF) 4 MG/ML IV SOLN
1.0000 mg | INTRAVENOUS | Status: DC | PRN
  Administered 2017-09-01 – 2017-09-02 (×5): 1 mg via INTRAVENOUS
  Filled 2017-09-01 (×6): qty 1

## 2017-09-01 MED ORDER — ACETAMINOPHEN 325 MG PO TABS
650.0000 mg | ORAL_TABLET | Freq: Four times a day (QID) | ORAL | Status: DC | PRN
  Administered 2017-09-03 – 2017-09-12 (×5): 650 mg via ORAL
  Filled 2017-09-01 (×5): qty 2

## 2017-09-01 MED ORDER — SODIUM CHLORIDE 0.9 % IV BOLUS (SEPSIS)
30.0000 mL/kg | Freq: Once | INTRAVENOUS | Status: AC
  Administered 2017-09-01: 1335 mL via INTRAVENOUS

## 2017-09-01 MED ORDER — ALBUTEROL SULFATE (2.5 MG/3ML) 0.083% IN NEBU
2.5000 mg | INHALATION_SOLUTION | RESPIRATORY_TRACT | Status: DC
  Administered 2017-09-01 (×2): 2.5 mg via RESPIRATORY_TRACT
  Filled 2017-09-01 (×2): qty 3

## 2017-09-01 MED ORDER — SODIUM CHLORIDE 0.9 % IV SOLN
8.0000 mg/h | INTRAVENOUS | Status: DC
  Administered 2017-09-01 – 2017-09-04 (×6): 8 mg/h via INTRAVENOUS
  Filled 2017-09-01 (×11): qty 80

## 2017-09-01 MED ORDER — POTASSIUM CHLORIDE 10 MEQ/100ML IV SOLN
10.0000 meq | INTRAVENOUS | Status: AC
  Administered 2017-09-01 (×3): 10 meq via INTRAVENOUS
  Filled 2017-09-01 (×3): qty 100

## 2017-09-01 MED ORDER — PIPERACILLIN-TAZOBACTAM 3.375 G IVPB 30 MIN
3.3750 g | Freq: Once | INTRAVENOUS | Status: AC
  Administered 2017-09-01: 3.375 g via INTRAVENOUS
  Filled 2017-09-01: qty 50

## 2017-09-01 MED ORDER — SODIUM CHLORIDE 0.9 % IV SOLN
INTRAVENOUS | Status: DC
  Administered 2017-09-01 – 2017-09-02 (×2): via INTRAVENOUS

## 2017-09-01 MED ORDER — PANTOPRAZOLE SODIUM 40 MG IV SOLR
40.0000 mg | Freq: Two times a day (BID) | INTRAVENOUS | Status: DC
  Filled 2017-09-01: qty 40

## 2017-09-01 MED ORDER — ONDANSETRON HCL 4 MG/2ML IJ SOLN
4.0000 mg | Freq: Once | INTRAMUSCULAR | Status: AC
  Administered 2017-09-01: 4 mg via INTRAVENOUS
  Filled 2017-09-01: qty 2

## 2017-09-01 MED ORDER — ACETAMINOPHEN 650 MG RE SUPP: 650.0000 mg | Freq: Four times a day (QID) | RECTAL | Status: DC | PRN

## 2017-09-01 MED ORDER — SODIUM CHLORIDE 0.9 % IV BOLUS (SEPSIS)
500.0000 mL | Freq: Once | INTRAVENOUS | Status: AC
  Administered 2017-09-01: 500 mL via INTRAVENOUS

## 2017-09-01 MED ORDER — SODIUM CHLORIDE 0.9 % IV SOLN
Freq: Once | INTRAVENOUS | Status: AC
  Administered 2017-09-01: 125 mL/h via INTRAVENOUS

## 2017-09-01 NOTE — ED Notes (Signed)
Pt placed on 2L/min per Steward DroneBrenda, Charity fundraiserN.  Pt's sat on RA was 91%.  After 2L/min via nasal cannula, pt at 95%.

## 2017-09-01 NOTE — ED Notes (Signed)
Pt vomit coffee ground emesis x 3. Pt brief changed; full of dark watery stool. Occult card collected and sent to lab.

## 2017-09-01 NOTE — H&P (Signed)
History and Physical    Nicole Key VQQ:[id number] DOB: 1928/08/04 DOA: 09/01/2017  PCP: Geoffry Paradise, MD Patient coming from: home  Chief Complaint: hematemesis/intractable nausea and vomiting  HPI: Nicole Key is a 84 y.o. female with medical history significant for large hiatal hernia with history of incarcerated hiatal hernia with obstruction 2016, aspiration pneumonia hypertension, fibromyalgia, anxiety, GERD, malnutrition presents to the emergency Department chief complaint intractable nausea and vomiting hematemesis. Initial evaluation reveals elevated lactic acid hypokalemia, leukocytosis, tachycardia, acute kidney injury possible urinary tract infection. [facility] are asked to admit  Information is obtained from the patient and grandson who is her primary caregiver and who is at the bedside. Grandson reports patient suffers from chronic intermittent nausea and vomiting. He reports this tends to worsen when her meneir's "kicks up". He reports the last 24 hours patient has suffered multiple episodes of emesis. Last night he reports emesis "was dark". He also reports dark stool over the last 24 hours. Patient complaints of abdominal pain diffusely. Worse in the lower quadrants. Pain is constant sharp worse with palpation. Grandson reports no oral intake over the last 24 hours. No reports of any dysuria hematuria frequency or urgency. Grandson also reports intermittent wet nonproductive cough. No reports of chest pain palpitations lower extremity edema or orthopnea.  ED Course: In the emergency department she is tachycardic hypertensive with a max temp 99.9. She has an elevated lactic acid, and leukocytosis.  urinalysis concerning for UTI. She is provided with 1 L normal saline vancomycin and Zosyn were started. Protonix drip loading dose but has not been initiated at the time of admission.  Review of Systems: As per HPI otherwise all other systems reviewed and are negative.     Ambulatory Status: Lucila Maine reports patient is basically full assist. She ambulates with walker has frequent falls  Past Medical History:  Diagnosis Date  . Anxiety   . Arthritis   . Aspiration pneumonia (HCC)   . Fibromyalgia   . GERD (gastroesophageal reflux disease)   . Hypertension   . Incarcerated hiatal hernia with obstruction 02/07/2015  . Insomnia   . Meniere's disease 1989  . Syncope 08/15/2013  . Vertigo     Past Surgical History:  Procedure Laterality Date  . ABDOMINAL HYSTERECTOMY    . APPENDECTOMY    . BILATERAL TOTAL MASTECTOMY WITH AXILLARY LYMPH NODE DISSECTION  1981    Social History   Socioeconomic History  . Marital status: Widowed    Spouse name: Not on file  . Number of children: Not on file  . Years of education: Not on file  . Highest education level: Not on file  Social Needs  . Financial resource strain: Not on file  . Food insecurity - worry: Not on file  . Food insecurity - inability: Not on file  . Transportation needs - medical: Not on file  . Transportation needs - non-medical: Not on file  Occupational History  . Not on file  Tobacco Use  . Smoking status: Never Smoker  . Smokeless tobacco: Never Used  Substance and Sexual Activity  . Alcohol use: No    Comment: denies alcohol use  . Drug use: No  . Sexual activity: No  Other Topics Concern  . Not on file  Social History Narrative  . Not on file    Allergies  Allergen Reactions  . Shellfish Allergy Hives    Family History  Problem Relation Age of Onset  . Aortic aneurysm Mother  Had ruptured aorta  . Lung cancer Father   . Heart disease Father     Prior to Admission medications   Medication Sig Start Date End Date Taking? Authorizing Provider  promethazine (PHENERGAN) 25 MG tablet Take 25 mg by mouth every 8 (eight) hours as needed for nausea/vomiting. 07/03/17  Yes [provider]  acetaminophen (TYLENOL) 325 MG tablet Take 325-650 mg by mouth every  6 (six) hours as needed for mild pain.    [provider]  ALPRAZolam Prudy Feeler(XANAX) 0.25 MG tablet Take 1 tablet (0.25 mg total) by mouth at bedtime as needed (anxiety). Patient taking differently: Take 0.25 mg by mouth 2 (two) times daily as needed for anxiety.  10/19/16   Joseph ArtVann, Jessica U, DO  ASPERCREME LIDOCAINE EX Apply 1 application topically 3 (three) times daily.    [provider]  Cyanocobalamin (VITAMIN B 12 PO) Take 1 tablet by mouth daily.    [provider]  diltiazem (CARDIZEM) 30 MG tablet Take 1 tablet (30 mg total) by mouth 3 (three) times daily. 03/10/17 03/10/18  Tyrone NineGrunz, Ryan B, MD  HYDROcodone-acetaminophen (NORCO/VICODIN) 5-325 MG tablet Take 1 tablet by mouth every 4 (four) hours as needed for moderate pain.    [provider]  hyoscyamine (LEVSIN, ANASPAZ) 0.125 MG tablet Take 0.125 mg by mouth every 4 (four) hours as needed for bladder spasms or cramping.    [provider]  meclizine (ANTIVERT) 25 MG tablet Take 25 mg by mouth 3 (three) times daily as needed for dizziness. 08/25/16   [provider]  metoprolol tartrate (LOPRESSOR) 25 MG tablet Take 1 tablet (25 mg total) by mouth 2 (two) times daily. 10/19/16   Joseph ArtVann, Jessica U, DO  pantoprazole (PROTONIX) 40 MG tablet Take 1 tablet (40 mg total) by mouth 2 (two) times daily before a meal. 02/09/15   Jerald Kiefhiu, Stephen K, MD  traMADol (ULTRAM) 50 MG tablet Take 50 mg by mouth every 6 (six) hours as needed for pain. 07/03/17   [provider]  traZODone (DESYREL) 50 MG tablet Take 1 tablet (50 mg total) by mouth at bedtime as needed for sleep. 02/09/15   Jerald Kiefhiu, Stephen K, MD    Physical Exam: Vitals:   09/01/17 [id number] 09/01/17 0815 09/01/17 0816 09/01/17 0828  BP:  121/77    Pulse: (!) 125 (!) 125 (!) 126   Resp: (!) 33 (!) 33 (!) 26   Temp:      TempSrc:      SpO2: 94% 95% 94%   Weight:    44.5 kg (98 lb)  Height:    5\' 8"  (1.727 m)     General:  Appears anxious and pale  as well as frail thin mild distress. Eyes:  PERRL, EOMI, normal lids, iris ENT:  grossly normal hearing, lips & tongue, his membranes of her mouth are pale and dry Neck:  no LAD, masses or thyromegaly Cardiovascular:  Tachycardic but regular no m/r/g. No LE edema.  Respiratory:  Mild increased work of breathing with conversation and repositioning. Breath sounds with good air movement I hear no rhonchi no crackles Abdomen:  soft, nondistended, positive bowel sounds diffuse mild to moderate tenderness to palpation worse in the lower left quadrant Skin:  no rash or induration seen on limited exam Musculoskeletal:  grossly normal tone BUE/BLE, good ROM, no bony abnormality Psychiatric:  grossly normal mood and affect, speech fluent and appropriate, AOx3 Neurologic:  CN 2-12 grossly intact, moves all extremities in coordinated fashion, sensation  intact  Labs on Admission: I have personally reviewed following labs and imaging studies  CBC: Recent Labs  Lab 09/01/17 0814 09/01/17 1116  WBC 42.2*  --   NEUTROABS  --  29.2*  HGB 11.9*  --   HCT 38.4  --   MCV 96.2  --   PLT 457*  --    Basic Metabolic Panel: Recent Labs  Lab 09/01/17 0814  NA 141  K 2.9*  CL 93*  CO2 21*  GLUCOSE 212*  BUN 38*  CREATININE 1.56*  CALCIUM 9.0   GFR: Estimated Creatinine Clearance: 17.8 mL/min (A) (by C-G formula based on SCr of 1.56 mg/dL (H)). Liver Function Tests: Recent Labs  Lab 09/01/17 0814  AST 47*  ALT 19  ALKPHOS 62  BILITOT 1.0  PROT 6.5  ALBUMIN 3.1*   Recent Labs  Lab 09/01/17 0814  LIPASE 27   No results for input(s): AMMONIA in the last 168 hours. Coagulation Profile: Recent Labs  Lab 09/01/17 0814  INR 1.12   Cardiac Enzymes: No results for input(s): CKTOTAL, CKMB, CKMBINDEX, TROPONINI in the last 168 hours. BNP (last 3 results) No results for input(s): PROBNP in the last 8760 hours. HbA1C: No results for input(s): HGBA1C in the last 72 hours. CBG: No results  for input(s): GLUCAP in the last 168 hours. Lipid Profile: No results for input(s): CHOL, HDL, LDLCALC, TRIG, CHOLHDL, LDLDIRECT in the last 72 hours. Thyroid Function Tests: No results for input(s): TSH, T4TOTAL, FREET4, T3FREE, THYROIDAB in the last 72 hours. Anemia Panel: No results for input(s): VITAMINB12, FOLATE, FERRITIN, TIBC, IRON, RETICCTPCT in the last 72 hours. Urine analysis:    Component Value Date/Time   COLORURINE AMBER (A) 09/01/2017 0826   APPEARANCEUR CLOUDY (A) 09/01/2017 0826   LABSPEC 1.020 09/01/2017 0826   PHURINE 5.0 09/01/2017 0826   GLUCOSEU NEGATIVE 09/01/2017 0826   HGBUR SMALL (A) 09/01/2017 0826   BILIRUBINUR NEGATIVE 09/01/2017 0826   KETONESUR 5 (A) 09/01/2017 0826   PROTEINUR 100 (A) 09/01/2017 0826   UROBILINOGEN 0.2 02/03/2015 1928   NITRITE NEGATIVE 09/01/2017 0826   LEUKOCYTESUR NEGATIVE 09/01/2017 0826    Creatinine Clearance: Estimated Creatinine Clearance: 17.8 mL/min (A) (by C-G formula based on SCr of 1.56 mg/dL (H)).  Sepsis Labs: @LABRCNTIP (procalcitonin:4,lacticidven:4) ) Recent Results (from the past 240 hour(s))  C difficile quick scan w PCR reflex     Status: None   Collection Time: 09/01/17  8:54 AM  Result Value Ref Range Status   C Diff antigen NEGATIVE NEGATIVE Final   C Diff toxin NEGATIVE NEGATIVE Final   C Diff interpretation No C. difficile detected.  Final     Radiological Exams on Admission: Ct Abdomen Pelvis Wo Contrast  Result Date: 09/01/2017 CLINICAL DATA:  84 year old female with diffuse abdominal and pelvic pain and vomiting for 1 day. EXAM: CT ABDOMEN AND PELVIS WITHOUT CONTRAST TECHNIQUE: Multidetector CT imaging of the abdomen and pelvis was performed following the standard protocol without IV contrast. COMPARISON:  02/04/2015 and prior CTs FINDINGS: Please note that parenchymal abnormalities may be missed without intravenous contrast. Lower chest: No acute abnormality. Bibasilar scarring noted. A very large  hiatal hernia is again identified. Hepatobiliary: No significant hepatic abnormalities. A small amount of gallbladder sludge versus cholelithiasis noted without CT evidence of acute cholecystitis. No biliary dilatation. Pancreas: Atrophic without other significant abnormality Spleen: Unremarkable Adrenals/Urinary Tract: Gas within the bladder wall is noted likely representing emphysematous cystitis. There is no evidence of a gas within the kidneys  or renal collecting systems. No hydronephrosis. The kidneys and adrenal glands are unremarkable. Stomach/Bowel: Diffuse circumferential wall thickening of the descending, sigmoid and distal transverse colon noted compatible with a colitis. There is no evidence of bowel obstruction or pneumoperitoneum. Diffuse colonic diverticulosis noted. Vascular/Lymphatic: Aortic atherosclerosis. No enlarged abdominal or pelvic lymph nodes. Reproductive: Status post hysterectomy. No adnexal masses. Other: No ascites or focal collection. Musculoskeletal: A 50% superior endplate compression fracture of L3 is identified which is new since 2016 but does not appear acute. Mild bony retropulsion is noted. IMPRESSION: 1. Diffuse wall thickening of the descending, sigmoid and distal transverse colon compatible with colitis. No bowel obstruction or pneumoperitoneum. 2. Gas within the bladder wall likely representing emphysematous cystitis. No evidence of gas within the kidneys or renal collecting systems. 3. 50% L3 compression fracture -new since 2016 but does not appear acute. Correlate clinically. 4. Gallbladder sludge versus cholelithiasis. No CT evidence of acute cholecystitis 5.  Aortic Atherosclerosis (ICD10-I70.0). Electronically Signed   By: Harmon Pier M.D.   On: 09/01/2017 11:06   Dg Chest Port 1 View  Result Date: 09/01/2017 CLINICAL DATA:  Patient with vomiting. EXAM: PORTABLE CHEST 1 VIEW COMPARISON:  Chest radiograph 03/08/2017. FINDINGS: Monitoring leads overlie the patient.  Stable cardiac and mediastinal contours. Thoracic aortic vascular calcifications. Large hiatal hernia. No consolidative pulmonary opacities. No pleural effusion or pneumothorax. IMPRESSION: No acute cardiopulmonary process.  Large hiatal hernia. Electronically Signed   By: Annia Belt M.D.   On: 09/01/2017 08:59    EKG: Independently reviewed. Sinus tachycardia Atrial premature complex Borderline right axis deviation Repol abnrm suggests ischemia, diffuse leads  Assessment/Plan Principal Problem:   Sepsis (HCC) Active Problems:   Meniere's disease   Anxiety   Essential hypertension   GERD (gastroesophageal reflux disease)   Hiatal hernia   AKI (acute kidney injury) (HCC)   GI bleed   Hematemesis   Melena   Hypokalemia   Leukocytosis   Elevated lactic acid level   Intractable nausea and vomiting   Tachycardia   #1. Sepsis. WBC 42, lactic acid 12, tachycardic, tachypnea acute kidney injury. Likely related to colitis per CT in setting of possible gi bleed. Chest x-ray with no acute cardiopulmonary process. Urinalysis questionable. She is provided with fluid resuscitation and IV antibiotics. At time of admission she remains tachycardic hypertensive but her oxygen saturation levels greater than 90% on room air. She is mildly uncomfortable and remains nauseated -Admit to step down -Follow blood culture -Continue vigorous IV fluids -Track lactic acid -Continue antibiotics -Monitor closely  #2. Hematemesis/ gi bleed/intractable nausea and vomiting. Patient with a history of intermittent nausea and vomiting/dyphagia with a globus sensation in the setting of large hiatal hernia. Hemoglobin stable however creatinine 1.56 and BUN 38. Medications include PPI twice a day. Chart review indicates she was evaluated by surgery 2016 and deemed not to be a surgical candidate due to poor functional status and age. She was supposed follow-up as outpatient and never did per her grandson -Nothing by  mouth -Scheduled Zofran -IV fluids as noted above -Serial CBCs -fobt -Protonic gtt -Requested GI consult  #3. Acute kidney injury. Likely related to dehydration setting and #2. She received IV fluids in the emergency department -Continue vigorous IV fluids -Hold nephrotoxins -Monitor urine output -Recheck in the morning  #4. Hypertension/tachycardia. History of same. Home medications include Cardizem and metoprolol. Heart rate in the 120s systolic blood pressure in the 180s in the emergency department. EKG as noted above. Denies chest pain -  Provide when necessary metoprolol with parameters -Monitor -Resume home medications as indicated  #5. Colitis. CT as noted above. See #1 -IV antibiotics as noted above -Supportive therapy -IV fluids -Nothing by mouth for now  #6. Fibromyalgia. Appears stable at baseline. Chart review indicates that PCP has been working on left upper epigastric and left-sided chest pain for over 3 years. At one point she was thought to have somatotype station disorder and polypharmacy -Continue home meds when able  DVT prophylaxis: scd  Code Status: limited  Family Communication: grandson at bedside  Disposition Plan: home  Consults called: hyung gi  Admission status: inpatient    Gwenyth Bender MD [facility]  If 7PM-7AM, please contact night-coverage [email or web address] Password [email or web address]  09/01/2017, 12:15 PM

## 2017-09-01 NOTE — ED Notes (Signed)
Attempted to call report

## 2017-09-01 NOTE — Consult Note (Signed)
Reason for Consult: Nausea/Vomiting, coffee-ground emesis, colitis Referring Physician: [facility]  Angelique Blonderorothy L Ribas HPI: This is an 84 year old female with a PMH of an incarcerated large hiatal hernia in 2016, depression, history of suicide attempt 2012, aspiration pneumonia, and GERD with complaints of nausea, vomiting, and coffee-ground emesis.  Over the past 24 hours her chronic nausea and vomiting from Meniere's disease worsened and it was accompanied by lower abdominal pain.  As a result of the symptoms the patient presented to the ER and a CT scan revealed a left sided colitis starting in the distal transverse colon, however, the CT scan was without contrast.  There was evidence of some dark stools as well as the coffee-ground emesis.  Her HGB did not change significantly, but she was heme positive.  In 08/2010 she was evaluated by Dr. Marina GoodellPerry for a GI issue, but I am unable to see the nature of that visit.  Her lactic acidosis was elevated at 12, but it has declined down to 4.  Her C. Diff PCR was negative.  Past Medical History:  Diagnosis Date  . Anxiety   . Arthritis   . Aspiration pneumonia (HCC)   . Fibromyalgia   . GERD (gastroesophageal reflux disease)   . Hypertension   . Incarcerated hiatal hernia with obstruction 02/07/2015  . Insomnia   . Meniere's disease 1989  . Syncope 08/15/2013  . Vertigo     Past Surgical History:  Procedure Laterality Date  . ABDOMINAL HYSTERECTOMY    . APPENDECTOMY    . BILATERAL TOTAL MASTECTOMY WITH AXILLARY LYMPH NODE DISSECTION  1981    Family History  Problem Relation Age of Onset  . Aortic aneurysm Mother        Had ruptured aorta  . Lung cancer Father   . Heart disease Father     Social History:  reports that  has never smoked. she has never used smokeless tobacco. She reports that she does not drink alcohol or use drugs.  Allergies:  Allergies  Allergen Reactions  . Shellfish Allergy Hives    Medications:   Scheduled: . ondansetron (ZOFRAN) IV  4 mg Intravenous Q4H   Continuous: . sodium chloride 125 mL/hr at 09/01/17 1224  . pantoprozole (PROTONIX) infusion 8 mg/hr (09/01/17 1258)  . piperacillin-tazobactam (ZOSYN)  IV    . potassium chloride 10 mEq (09/01/17 1358)    Results for orders placed or performed during the hospital encounter of 09/01/17 (from the past 24 hour(s))  Comprehensive metabolic panel     Status: Abnormal   Collection Time: 09/01/17  8:14 AM  Result Value Ref Range   Sodium 141 135 - 145 mmol/L   Potassium 2.9 (L) 3.5 - 5.1 mmol/L   Chloride 93 (L) 101 - 111 mmol/L   CO2 21 (L) 22 - 32 mmol/L   Glucose, Bld 212 (H) 65 - 99 mg/dL   BUN 38 (H) 6 - 20 mg/dL   Creatinine, Ser 1.611.56 (H) 0.44 - 1.00 mg/dL   Calcium 9.0 8.9 - 09.610.3 mg/dL   Total Protein 6.5 6.5 - 8.1 g/dL   Albumin 3.1 (L) 3.5 - 5.0 g/dL   AST 47 (H) 15 - 41 U/L   ALT 19 14 - 54 U/L   Alkaline Phosphatase 62 38 - 126 U/L   Total Bilirubin 1.0 0.3 - 1.2 mg/dL   GFR calc non Af Amer 29 (L) >60 mL/min   GFR calc Af Amer 33 (L) >60 mL/min   Anion gap  27 (H) 5 - 15  CBC     Status: Abnormal   Collection Time: 09/01/17  8:14 AM  Result Value Ref Range   WBC 42.2 (H) 4.0 - 10.5 K/uL   RBC 3.99 3.87 - 5.11 MIL/uL   Hemoglobin 11.9 (L) 12.0 - 15.0 g/dL   HCT 16.1 09.6 - 04.5 %   MCV 96.2 78.0 - 100.0 fL   MCH 29.8 26.0 - 34.0 pg   MCHC 31.0 30.0 - 36.0 g/dL   RDW 40.9 81.1 - 91.4 %   Platelets 457 (H) 150 - 400 K/uL  Lipase, blood     Status: None   Collection Time: 09/01/17  8:14 AM  Result Value Ref Range   Lipase 27 11 - 51 U/L  Protime-INR     Status: None   Collection Time: 09/01/17  8:14 AM  Result Value Ref Range   Prothrombin Time 14.3 11.4 - 15.2 seconds   INR 1.12   Type and screen [facility]     Status: None   Collection Time: 09/01/17  8:16 AM  Result Value Ref Range   ABO/RH(D) O POS    Antibody Screen NEG    Sample Expiration 09/04/2017   ABO/Rh     Status:  None (Preliminary result)   Collection Time: 09/01/17  8:16 AM  Result Value Ref Range   ABO/RH(D) O POS   Urinalysis, Routine w reflex microscopic     Status: Abnormal   Collection Time: 09/01/17  8:26 AM  Result Value Ref Range   Color, Urine AMBER (A) YELLOW   APPearance CLOUDY (A) CLEAR   Specific Gravity, Urine 1.020 1.005 - 1.030   pH 5.0 5.0 - 8.0   Glucose, UA NEGATIVE NEGATIVE mg/dL   Hgb urine dipstick SMALL (A) NEGATIVE   Bilirubin Urine NEGATIVE NEGATIVE   Ketones, ur 5 (A) NEGATIVE mg/dL   Protein, ur 782 (A) NEGATIVE mg/dL   Nitrite NEGATIVE NEGATIVE   Leukocytes, UA NEGATIVE NEGATIVE   RBC / HPF 6-30 0 - 5 RBC/hpf   WBC, UA 6-30 0 - 5 WBC/hpf   Bacteria, UA MANY (A) NONE SEEN   Squamous Epithelial / LPF 0-5 (A) NONE SEEN   Mucus PRESENT    Hyaline Casts, UA PRESENT   I-stat troponin, ED     Status: None   Collection Time: 09/01/17  8:39 AM  Result Value Ref Range   Troponin i, poc 0.02 0.00 - 0.08 ng/mL   Comment 3          I-Stat CG4 Lactic Acid, ED     Status: Abnormal   Collection Time: 09/01/17  8:41 AM  Result Value Ref Range   Lactic Acid, Venous 12.68 (HH) 0.5 - 1.9 mmol/L   Comment NOTIFIED PHYSICIAN   POC occult blood, ED     Status: None   Collection Time: 09/01/17  8:46 AM  Result Value Ref Range   Fecal Occult Bld NEGATIVE NEGATIVE  C difficile quick scan w PCR reflex     Status: None   Collection Time: 09/01/17  8:54 AM  Result Value Ref Range   C Diff antigen NEGATIVE NEGATIVE   C Diff toxin NEGATIVE NEGATIVE   C Diff interpretation No C. difficile detected.   Differential     Status: Abnormal   Collection Time: 09/01/17 11:16 AM  Result Value Ref Range   Neutrophils Relative % 89 %   Neutro Abs 29.2 (H) 1.7 - 7.7 K/uL  Lymphocytes Relative 4 %   Lymphs Abs 1.2 0.7 - 4.0 K/uL   Monocytes Relative 7 %   Monocytes Absolute 2.4 (H) 0.1 - 1.0 K/uL   Eosinophils Relative 0 %   Eosinophils Absolute 0.0 0.0 - 0.7 K/uL   Basophils Relative  0 %   Basophils Absolute 0.0 0.0 - 0.1 K/uL  Lactic acid, plasma     Status: Abnormal   Collection Time: 09/01/17 12:44 PM  Result Value Ref Range   Lactic Acid, Venous 4.7 (HH) 0.5 - 1.9 mmol/L  I-Stat CG4 Lactic Acid, ED  (not at  [facility])     Status: Abnormal   Collection Time: 09/01/17 12:51 PM  Result Value Ref Range   Lactic Acid, Venous 4.87 (HH) 0.5 - 1.9 mmol/L   Comment NOTIFIED PHYSICIAN   POC occult blood, ED RN will collect     Status: Abnormal   Collection Time: 09/01/17  1:14 PM  Result Value Ref Range   Fecal Occult Bld POSITIVE (A) NEGATIVE     Ct Abdomen Pelvis Wo Contrast  Result Date: 09/01/2017 CLINICAL DATA:  84 year old female with diffuse abdominal and pelvic pain and vomiting for 1 day. EXAM: CT ABDOMEN AND PELVIS WITHOUT CONTRAST TECHNIQUE: Multidetector CT imaging of the abdomen and pelvis was performed following the standard protocol without IV contrast. COMPARISON:  02/04/2015 and prior CTs FINDINGS: Please note that parenchymal abnormalities may be missed without intravenous contrast. Lower chest: No acute abnormality. Bibasilar scarring noted. A very large hiatal hernia is again identified. Hepatobiliary: No significant hepatic abnormalities. A small amount of gallbladder sludge versus cholelithiasis noted without CT evidence of acute cholecystitis. No biliary dilatation. Pancreas: Atrophic without other significant abnormality Spleen: Unremarkable Adrenals/Urinary Tract: Gas within the bladder wall is noted likely representing emphysematous cystitis. There is no evidence of a gas within the kidneys or renal collecting systems. No hydronephrosis. The kidneys and adrenal glands are unremarkable. Stomach/Bowel: Diffuse circumferential wall thickening of the descending, sigmoid and distal transverse colon noted compatible with a colitis. There is no evidence of bowel obstruction or pneumoperitoneum. Diffuse colonic diverticulosis noted. Vascular/Lymphatic: Aortic  atherosclerosis. No enlarged abdominal or pelvic lymph nodes. Reproductive: Status post hysterectomy. No adnexal masses. Other: No ascites or focal collection. Musculoskeletal: A 50% superior endplate compression fracture of L3 is identified which is new since 2016 but does not appear acute. Mild bony retropulsion is noted. IMPRESSION: 1. Diffuse wall thickening of the descending, sigmoid and distal transverse colon compatible with colitis. No bowel obstruction or pneumoperitoneum. 2. Gas within the bladder wall likely representing emphysematous cystitis. No evidence of gas within the kidneys or renal collecting systems. 3. 50% L3 compression fracture -new since 2016 but does not appear acute. Correlate clinically. 4. Gallbladder sludge versus cholelithiasis. No CT evidence of acute cholecystitis 5.  Aortic Atherosclerosis (ICD10-I70.0). Electronically Signed   By: Harmon Pier M.D.   On: 09/01/2017 11:06   Dg Chest Port 1 View  Result Date: 09/01/2017 CLINICAL DATA:  Patient with vomiting. EXAM: PORTABLE CHEST 1 VIEW COMPARISON:  Chest radiograph 03/08/2017. FINDINGS: Monitoring leads overlie the patient. Stable cardiac and mediastinal contours. Thoracic aortic vascular calcifications. Large hiatal hernia. No consolidative pulmonary opacities. No pleural effusion or pneumothorax. IMPRESSION: No acute cardiopulmonary process.  Large hiatal hernia. Electronically Signed   By: Annia Belt M.D.   On: 09/01/2017 08:59    ROS:  As stated above in the HPI otherwise negative.  Blood pressure (!) 142/78, pulse (!) 122, temperature (!) 100.5 F (38.1  C), temperature source Rectal, resp. rate (!) 23, height 5\' 8"  (1.727 m), weight 44.5 kg (98 lb), SpO2 94 %.    PE: Gen: ill appearing, thin HEENT:  [place]/AT, EOMI Neck: Supple, no LAD Lungs: some end expiratory wheezing CV: RRR without M/G/R ABM: Soft, diffuse abdominal tenderness, +BS Ext: No C/C/E  Assessment/Plan: 1) Coffee-ground emesis. 2) Large hiatal  hernia. 3) ? ischemic colitis. 4) Hypoxemia.   The patient does require endoscopic evaluation with an EGD and FFS, however, her respiratory status is extremely poor.  With 3 liters of [place] she is tenuously saturating at 90-91%.  It is too high risk to perform any procedures at this time.  Plan: 1) Continue with antibiotics and pain control. 2) Follow HGB and transfuse if necessary. 3) Monitor respiratory status. 4) Hold on any GI procedures at this time.     Nicole Key D 09/01/2017, 2:34 PM

## 2017-09-01 NOTE — ED Provider Notes (Signed)
[facility] EMERGENCY DEPARTMENT Provider Note   CSN: [id number] Arrival date & time: 09/01/17  0805     History   Chief Complaint Chief Complaint  Patient presents with  . GI Bleeding    HPI Nicole Key is a 84 y.o. female.  HPI Patient is brought by EMS from home.  Reportedly she had coffee grounds emesis all night long.  Patient is alert and answering questions but reports she cannot remember how long she has been vomiting.  Reportedly she is also been having black-looking diarrheal stool.  Patient endorses some lower abdominal.  She reports she just feels bad.  I note a wet cough.  She reports she always has that.  She denies any chest pain. Past Medical History:  Diagnosis Date  . Anxiety   . Arthritis   . Aspiration pneumonia (HCC)   . Fibromyalgia   . GERD (gastroesophageal reflux disease)   . Hypertension   . Incarcerated hiatal hernia with obstruction 02/07/2015  . Insomnia   . Meniere's disease 1989  . Syncope 08/15/2013  . Vertigo     Patient Active Problem List   Diagnosis Date Noted  . GI bleed 09/01/2017  . Hematemesis 09/01/2017  . Melena 09/01/2017  . Hypokalemia 09/01/2017  . Leukocytosis 09/01/2017  . Elevated lactic acid level 09/01/2017  . Intractable nausea and vomiting 09/01/2017  . Tachycardia 09/01/2017  . Dysphagia   . AKI (acute kidney injury) (HCC) 03/09/2017  . Severely underweight adult 03/09/2017  . Abnormal weight loss 03/09/2017  . Cerebrovascular disease 03/09/2017  . Globus sensation 03/09/2017  . Chest pain, rule out acute myocardial infarction 03/08/2017  . Hiatal hernia 02/07/2015  . Sepsis (HCC) 02/04/2015  . Anxiety 02/04/2015  . Essential hypertension 02/04/2015  . GERD (gastroesophageal reflux disease) 02/04/2015  . Protein-calorie malnutrition, severe (HCC) 02/04/2015  . Unstable gait 08/15/2013  . Depression 08/15/2013  . Osteoporosis 07/18/2011  . Meniere's disease 07/18/2011    Past  Surgical History:  Procedure Laterality Date  . ABDOMINAL HYSTERECTOMY    . APPENDECTOMY    . BILATERAL TOTAL MASTECTOMY WITH AXILLARY LYMPH NODE DISSECTION  1981    OB History    No data available       Home Medications    Prior to Admission medications   Medication Sig Start Date End Date Taking? Authorizing Provider  promethazine (PHENERGAN) 25 MG tablet Take 25 mg by mouth every 8 (eight) hours as needed for nausea/vomiting. 07/03/17  Yes [provider]  acetaminophen (TYLENOL) 325 MG tablet Take 325-650 mg by mouth every 6 (six) hours as needed for mild pain.    [provider]  ALPRAZolam Prudy Feeler) 0.25 MG tablet Take 1 tablet (0.25 mg total) by mouth at bedtime as needed (anxiety). Patient taking differently: Take 0.25 mg by mouth 2 (two) times daily as needed for anxiety.  10/19/16   Joseph Art, DO  ASPERCREME LIDOCAINE EX Apply 1 application topically 3 (three) times daily.    [provider]  Cyanocobalamin (VITAMIN B 12 PO) Take 1 tablet by mouth daily.    [provider]  diltiazem (CARDIZEM) 30 MG tablet Take 1 tablet (30 mg total) by mouth 3 (three) times daily. 03/10/17 03/10/18  Tyrone Nine, MD  HYDROcodone-acetaminophen (NORCO/VICODIN) 5-325 MG tablet Take 1 tablet by mouth every 4 (four) hours as needed for moderate pain.    [provider]  hyoscyamine (LEVSIN, ANASPAZ) 0.125 MG tablet Take 0.125 mg by mouth every  4 (four) hours as needed for bladder spasms or cramping.    [provider]  meclizine (ANTIVERT) 25 MG tablet Take 25 mg by mouth 3 (three) times daily as needed for dizziness. 08/25/16   [provider]  metoprolol tartrate (LOPRESSOR) 25 MG tablet Take 1 tablet (25 mg total) by mouth 2 (two) times daily. 10/19/16   Joseph ArtVann, Jessica U, DO  pantoprazole (PROTONIX) 40 MG tablet Take 1 tablet (40 mg total) by mouth 2 (two) times daily before a meal. 02/09/15   Jerald Kiefhiu, Stephen K, MD  traMADol (ULTRAM) 50  MG tablet Take 50 mg by mouth every 6 (six) hours as needed for pain. 07/03/17   [provider]  traZODone (DESYREL) 50 MG tablet Take 1 tablet (50 mg total) by mouth at bedtime as needed for sleep. 02/09/15   Jerald Kiefhiu, Stephen K, MD    Family History Family History  Problem Relation Age of Onset  . Aortic aneurysm Mother        Had ruptured aorta  . Lung cancer Father   . Heart disease Father     Social History Social History   Tobacco Use  . Smoking status: Never Smoker  . Smokeless tobacco: Never Used  Substance Use Topics  . Alcohol use: No    Comment: denies alcohol use  . Drug use: No     Allergies   Shellfish allergy   Review of Systems Review of Systems 10 Systems reviewed and are negative for acute change except as noted in the HPI.   Physical Exam Updated Vital Signs BP 125/89   Pulse (!) 136   Temp (!) 100.5 F (38.1 C) (Rectal)   Resp (!) 38   Ht 5\' 8"  (1.727 m)   Wt 44.5 kg (98 lb)   SpO2 94%   BMI 14.90 kg/m   Physical Exam  Constitutional:  Patient is pale and extremely fatigued appearance.  She is very thin borderline cachexia  HENT:  Head: Normocephalic and atraumatic.  Mucous membranes are dry.  There is blackish brown staining on the tongue.  Eyes: EOM are normal. Pupils are equal, round, and reactive to light.  Patient's eyes are sunken.  Neck: Neck supple.  Cardiovascular:  Tachycardia.  Distant heart sounds.  Pulmonary/Chest:  No respiratory distress.  Breath sounds are very soft.  Occasional rhonchi.  Occasional wet cough.  Abdominal: Soft. She exhibits no distension. There is no guarding.  Patient endorses very mild central discomfort to palpation.  Abdomen is soft.  Genitourinary:  Genitourinary Comments: Patient has had diarrheal stool that is thin and brownish black in appearance.  Musculoskeletal: Normal range of motion.  No peripheral edema.  No calf tenderness.  Skin: Skin is warm and dry. There is pallor.    Psychiatric:  Patient is alert and appropriate.  She is flat in affect with ill appearance.     ED Treatments / Results  Labs (all labs ordered are listed, but only abnormal results are displayed) Labs Reviewed  COMPREHENSIVE METABOLIC PANEL - Abnormal; Notable for the following components:      Result Value   Potassium 2.9 (*)    Chloride 93 (*)    CO2 21 (*)    Glucose, Bld 212 (*)    BUN 38 (*)    Creatinine, Ser 1.56 (*)    Albumin 3.1 (*)    AST 47 (*)    GFR calc non Af Amer 29 (*)    GFR calc Af Amer 33 (*)  Anion gap 27 (*)    All other components within normal limits  CBC - Abnormal; Notable for the following components:   WBC 42.2 (*)    Hemoglobin 11.9 (*)    Platelets 457 (*)    All other components within normal limits  URINALYSIS, ROUTINE W REFLEX MICROSCOPIC - Abnormal; Notable for the following components:   Color, Urine AMBER (*)    APPearance CLOUDY (*)    Hgb urine dipstick SMALL (*)    Ketones, ur 5 (*)    Protein, ur 100 (*)    Bacteria, UA MANY (*)    Squamous Epithelial / LPF 0-5 (*)    All other components within normal limits  DIFFERENTIAL - Abnormal; Notable for the following components:   Neutro Abs 29.2 (*)    Monocytes Absolute 2.4 (*)    All other components within normal limits  CBC - Abnormal; Notable for the following components:   WBC 32.5 (*)    RBC 3.50 (*)    Hemoglobin 10.4 (*)    HCT 32.4 (*)    All other components within normal limits  LACTIC ACID, PLASMA - Abnormal; Notable for the following components:   Lactic Acid, Venous 4.7 (*)    All other components within normal limits  I-STAT CG4 LACTIC ACID, ED - Abnormal; Notable for the following components:   Lactic Acid, Venous 12.68 (*)    All other components within normal limits  POC OCCULT BLOOD, ED - Abnormal; Notable for the following components:   Fecal Occult Bld POSITIVE (*)    All other components within normal limits  I-STAT CG4 LACTIC ACID, ED - Abnormal;  Notable for the following components:   Lactic Acid, Venous 4.87 (*)    All other components within normal limits  GASTROINTESTINAL PANEL BY PCR, STOOL (REPLACES STOOL CULTURE)  C DIFFICILE QUICK SCREEN W PCR REFLEX  CULTURE, BLOOD (ROUTINE X 2)  CULTURE, BLOOD (ROUTINE X 2)  LIPASE, BLOOD  PROTIME-INR  OCCULT BLOOD X 1 CARD TO LAB, STOOL  PROTIME-INR  LACTIC ACID, PLASMA  POC OCCULT BLOOD, ED  I-STAT TROPONIN, ED  I-STAT CG4 LACTIC ACID, ED  I-STAT CG4 LACTIC ACID, ED  TYPE AND SCREEN  ABO/RH    EKG  EKG Interpretation None       Radiology Ct Abdomen Pelvis Wo Contrast  Result Date: 09/01/2017 CLINICAL DATA:  84 year old female with diffuse abdominal and pelvic pain and vomiting for 1 day. EXAM: CT ABDOMEN AND PELVIS WITHOUT CONTRAST TECHNIQUE: Multidetector CT imaging of the abdomen and pelvis was performed following the standard protocol without IV contrast. COMPARISON:  02/04/2015 and prior CTs FINDINGS: Please note that parenchymal abnormalities may be missed without intravenous contrast. Lower chest: No acute abnormality. Bibasilar scarring noted. A very large hiatal hernia is again identified. Hepatobiliary: No significant hepatic abnormalities. A small amount of gallbladder sludge versus cholelithiasis noted without CT evidence of acute cholecystitis. No biliary dilatation. Pancreas: Atrophic without other significant abnormality Spleen: Unremarkable Adrenals/Urinary Tract: Gas within the bladder wall is noted likely representing emphysematous cystitis. There is no evidence of a gas within the kidneys or renal collecting systems. No hydronephrosis. The kidneys and adrenal glands are unremarkable. Stomach/Bowel: Diffuse circumferential wall thickening of the descending, sigmoid and distal transverse colon noted compatible with a colitis. There is no evidence of bowel obstruction or pneumoperitoneum. Diffuse colonic diverticulosis noted. Vascular/Lymphatic: Aortic  atherosclerosis. No enlarged abdominal or pelvic lymph nodes. Reproductive: Status post hysterectomy. No adnexal masses. Other: No ascites  or focal collection. Musculoskeletal: A 50% superior endplate compression fracture of L3 is identified which is new since 2016 but does not appear acute. Mild bony retropulsion is noted. IMPRESSION: 1. Diffuse wall thickening of the descending, sigmoid and distal transverse colon compatible with colitis. No bowel obstruction or pneumoperitoneum. 2. Gas within the bladder wall likely representing emphysematous cystitis. No evidence of gas within the kidneys or renal collecting systems. 3. 50% L3 compression fracture -new since 2016 but does not appear acute. Correlate clinically. 4. Gallbladder sludge versus cholelithiasis. No CT evidence of acute cholecystitis 5.  Aortic Atherosclerosis (ICD10-I70.0). Electronically Signed   By: Harmon Pier M.D.   On: 09/01/2017 11:06   Dg Chest Port 1 View  Result Date: 09/01/2017 CLINICAL DATA:  Patient with vomiting. EXAM: PORTABLE CHEST 1 VIEW COMPARISON:  Chest radiograph 03/08/2017. FINDINGS: Monitoring leads overlie the patient. Stable cardiac and mediastinal contours. Thoracic aortic vascular calcifications. Large hiatal hernia. No consolidative pulmonary opacities. No pleural effusion or pneumothorax. IMPRESSION: No acute cardiopulmonary process.  Large hiatal hernia. Electronically Signed   By: Annia Belt M.D.   On: 09/01/2017 08:59    Procedures Procedures (including critical care time) CRITICAL CARE Performed by: Cristy Friedlander   Total critical care time: 30 minutes  Critical care time was exclusive of separately billable procedures and treating other patients.  Critical care was necessary to treat or prevent imminent or life-threatening deterioration.  Critical care was time spent personally by me on the following activities: development of treatment plan with patient and/or surrogate as well as nursing,  discussions with consultants, evaluation of patient's response to treatment, examination of patient, obtaining history from patient or surrogate, ordering and performing treatments and interventions, ordering and review of laboratory studies, ordering and review of radiographic studies, pulse oximetry and re-evaluation of patient's condition. Medications Ordered in ED Medications  pantoprazole (PROTONIX) 80 mg in sodium chloride 0.9 % 250 mL (0.32 mg/mL) infusion (8 mg/hr Intravenous New Bag/Given 09/01/17 1258)  piperacillin-tazobactam (ZOSYN) IVPB 2.25 g (not administered)  0.9 %  sodium chloride infusion ( Intravenous New Bag/Given 09/01/17 1224)  acetaminophen (TYLENOL) tablet 650 mg (not administered)    Or  acetaminophen (TYLENOL) suppository 650 mg (not administered)  ondansetron (ZOFRAN) injection 4 mg (4 mg Intravenous Given 09/01/17 1528)  metoprolol tartrate (LOPRESSOR) injection 5 mg (5 mg Intravenous Given 09/01/17 1329)  potassium chloride 10 mEq in 100 mL IVPB (10 mEq Intravenous New Bag/Given 09/01/17 1526)  morphine 4 MG/ML injection 1 mg (1 mg Intravenous Given 09/01/17 1327)  ondansetron (ZOFRAN) injection 4 mg (4 mg Intravenous Given 09/01/17 0838)  sodium chloride 0.9 % bolus 500 mL (0 mLs Intravenous Stopped 09/01/17 0915)  pantoprazole (PROTONIX) 80 mg in sodium chloride 0.9 % 100 mL IVPB (0 mg Intravenous Stopped 09/01/17 0918)  0.9 %  sodium chloride infusion ( Intravenous Stopped 09/01/17 1212)  sodium chloride 0.9 % bolus 1,335 mL (0 mL/kg  44.5 kg Intravenous Stopped 09/01/17 1212)  potassium chloride 10 mEq in 100 mL IVPB (0 mEq Intravenous Stopped 09/01/17 1224)  piperacillin-tazobactam (ZOSYN) IVPB 3.375 g (0 g Intravenous Stopped 09/01/17 1211)     Initial Impression / Assessment and Plan / ED Course  I have reviewed the triage vital signs and the nursing notes.  Pertinent labs & imaging results that were available during my care of the patient were reviewed by me and considered in  my medical decision making (see chart for details).     Final Clinical Impressions(s) / ED  Diagnoses   Final diagnoses:  Sepsis, due to unspecified organism (HCC)  Intractable vomiting with nausea, unspecified vomiting type  Hiatal hernia  Patient has had intractable vomiting and diarrheal stool.  She is very dehydrated with severe leukocytosis, lactic acidosis and hypokalemia.  Initially presented with complaint of GI bleed.  Protonix was initiated.  She has had significant problems with hiatal hernia.  May be a contributing factor.  CT abdomen does not show acute surgical intra-abdominal issues.  Patient is admitted for further treatment and diagnostic evaluation as needed.  ED Discharge Orders    None       Arby Barrette, MD 09/01/17 [phone]

## 2017-09-01 NOTE — ED Triage Notes (Addendum)
Pt here from home via [facility] after vomiting coffee grounds emesis all night.  Pale, hypotensive and slow to respond.  Given 500 ns bolus for sbp in 100's and placed on 2L [place] for O2 90% RA.  Pt ao x 4.

## 2017-09-01 NOTE — Progress Notes (Signed)
Pharmacy Antibiotic Note  Nicole Key is a 84 y.o. female admitted on 09/01/2017 with intra-abdominal infection.  Pharmacy has been consulted for Zosyn dosing.  One time dose ordered in ED. SCr 1.56, CrCl ~5515ml/min  Plan: Start Zosyn 2.25g IV Q8hr Monitor clinical picture, renal function F/U C&S, abx deescalation / LOT   Height: 5\' 8"  (172.7 cm) Weight: 98 lb (44.5 kg) IBW/kg (Calculated) : 63.9  Temp (24hrs), Avg:99.2 F (37.3 C), Min:99.2 F (37.3 C), Max:99.2 F (37.3 C)  Recent Labs  Lab 09/01/17 0814 09/01/17 0841  WBC 42.2*  --   CREATININE 1.56*  --   LATICACIDVEN  --  12.68*    Estimated Creatinine Clearance: 17.8 mL/min (A) (by C-G formula based on SCr of 1.56 mg/dL (H)).    Allergies  Allergen Reactions  . Shellfish Allergy Hives   Thank you for allowing pharmacy to be a part of this patient's care.  Enzo BiNathan Yuma Blucher, PharmD, BCPS Clinical Pharmacist Pager [phone] 09/01/2017 10:26 AM

## 2017-09-02 ENCOUNTER — Inpatient Hospital Stay (HOSPITAL_COMMUNITY): Payer: Medicare Other

## 2017-09-02 LAB — GLUCOSE, CAPILLARY
GLUCOSE-CAPILLARY: 124 mg/dL — AB (ref 65–99)
Glucose-Capillary: 115 mg/dL — ABNORMAL HIGH (ref 65–99)
Glucose-Capillary: 104 mg/dL — ABNORMAL HIGH (ref 65–99)

## 2017-09-02 LAB — CBC
HCT: 29.6 % — ABNORMAL LOW (ref 36.0–46.0)
Hemoglobin: 9.4 g/dL — ABNORMAL LOW (ref 12.0–15.0)
MCH: 29.7 pg (ref 26.0–34.0)
MCHC: 31.8 g/dL (ref 30.0–36.0)
MCV: 93.7 fL (ref 78.0–100.0)
Platelets: 329 K/uL (ref 150–400)
RBC: 3.16 MIL/uL — ABNORMAL LOW (ref 3.87–5.11)
RDW: 15.6 % — ABNORMAL HIGH (ref 11.5–15.5)
WBC: 34 K/uL — ABNORMAL HIGH (ref 4.0–10.5)

## 2017-09-02 LAB — HEMOGLOBIN AND HEMATOCRIT, BLOOD
HEMATOCRIT: 28.7 % — AB (ref 36.0–46.0)
HEMATOCRIT: 29.9 % — AB (ref 36.0–46.0)
HEMOGLOBIN: 9.1 g/dL — AB (ref 12.0–15.0)
HEMOGLOBIN: 9.8 g/dL — AB (ref 12.0–15.0)

## 2017-09-02 LAB — BASIC METABOLIC PANEL WITH GFR
Anion gap: 9 (ref 5–15)
BUN: 43 mg/dL — ABNORMAL HIGH (ref 6–20)
CO2: 24 mmol/L (ref 22–32)
Calcium: 7.3 mg/dL — ABNORMAL LOW (ref 8.9–10.3)
Chloride: 108 mmol/L (ref 101–111)
Creatinine, Ser: 1.13 mg/dL — ABNORMAL HIGH (ref 0.44–1.00)
GFR calc Af Amer: 49 mL/min — ABNORMAL LOW
GFR calc non Af Amer: 42 mL/min — ABNORMAL LOW
Glucose, Bld: 132 mg/dL — ABNORMAL HIGH (ref 65–99)
Potassium: 3.1 mmol/L — ABNORMAL LOW (ref 3.5–5.1)
Sodium: 141 mmol/L (ref 135–145)

## 2017-09-02 LAB — MAGNESIUM: Magnesium: 1.4 mg/dL — ABNORMAL LOW (ref 1.7–2.4)

## 2017-09-02 LAB — PREPARE RBC (CROSSMATCH)

## 2017-09-02 MED ORDER — SODIUM CHLORIDE 0.9 % IV SOLN
Freq: Once | INTRAVENOUS | Status: AC
  Administered 2017-09-02: 10:00:00 via INTRAVENOUS

## 2017-09-02 MED ORDER — POTASSIUM CHLORIDE 10 MEQ/100ML IV SOLN
10.0000 meq | Freq: Once | INTRAVENOUS | Status: AC
  Administered 2017-09-02: 10 meq via INTRAVENOUS

## 2017-09-02 MED ORDER — METOPROLOL TARTRATE 25 MG PO TABS: 25.0000 mg | ORAL_TABLET | Freq: Two times a day (BID) | ORAL | Status: DC

## 2017-09-02 MED ORDER — ACETAMINOPHEN 10 MG/ML IV SOLN
1000.0000 mg | Freq: Four times a day (QID) | INTRAVENOUS | Status: AC | PRN
  Administered 2017-09-02 – 2017-09-03 (×2): 1000 mg via INTRAVENOUS
  Filled 2017-09-02 (×3): qty 100

## 2017-09-02 MED ORDER — ALBUTEROL SULFATE (2.5 MG/3ML) 0.083% IN NEBU: 2.5000 mg | INHALATION_SOLUTION | RESPIRATORY_TRACT | Status: DC | PRN

## 2017-09-02 MED ORDER — DEXTROSE-NACL 5-0.9 % IV SOLN
INTRAVENOUS | Status: DC
  Administered 2017-09-02 – 2017-09-09 (×8): via INTRAVENOUS

## 2017-09-02 MED ORDER — POTASSIUM CHLORIDE 10 MEQ/100ML IV SOLN
10.0000 meq | INTRAVENOUS | Status: AC
  Administered 2017-09-02 (×2): 10 meq via INTRAVENOUS
  Filled 2017-09-02 (×3): qty 100

## 2017-09-02 NOTE — Progress Notes (Signed)
Notified by CCMD that patient had a very Blase run of PSVT.  Will continue to monitor.

## 2017-09-02 NOTE — Progress Notes (Signed)
PROGRESS NOTE    Nicole Key  WUJ:[id number] DOB: November 09, 1927 DOA: 09/01/2017 PCP: Geoffry Paradise, MD   Brief Narrative: Nicole Key is a 84 y.o. female with medical history significant for large hiatal hernia with history of incarcerated hiatal hernia with obstruction 2016, aspiration pneumonia hypertension, fibromyalgia, anxiety, GERD, malnutrition presents to the emergency Department chief complaint intractable nausea and vomiting hematemesis. Initial evaluation reveals elevated lactic acid hypokalemia, leukocytosis, tachycardia, acute kidney injury possible urinary tract infection. [facility] are asked to admit   Assessment & Plan:   Principal Problem:   Sepsis (HCC) Active Problems:   Meniere's disease   Anxiety   Essential hypertension   GERD (gastroesophageal reflux disease)   Hiatal hernia   AKI (acute kidney injury) (HCC)   GI bleed   Hematemesis   Melena   Hypokalemia   Leukocytosis   Elevated lactic acid level   Intractable nausea and vomiting   Tachycardia  1-Sepsis;  Presents with leukocytosis , lactic acidosis.  WBC trending down.  Lactic acid decrease from 12---4.9 Continue with Zosyn.   GI bleed, hematemesis.  Hb drop from 11 to 9.4 on admission.  Will transfuse one unit PRBC.  Protonix gtt.  Cycle hb.   Colitis;  C diff negative.  GI pathogen negative.  Still with diarrhea, bloody stool.   Hypokalemia;  Improved.  Replete IV   Acute hypoxic respiratory failure;  Chest x ray, atelectasis vs infection.  Per nurse patient cough phlegm , brownish, after she was lying down.   AKI; in setting of sepsis, hypovolemia.  Cr peak to 1.5. Improved.    HTN; tachycardia;  IV Metoprolol PRN.   Fibromyalgia. Appears stable at baseline.     DVT prophylaxis: SCD Code Status: Partial code.  Family Communication:  Disposition Plan: Remain in the step down unit.   Consultants:   GI    Procedures:    none   Antimicrobials:  Zosyn 09-01-2017   Subjective: She is alert. She is not feeling well, feels weak and tired.  Complaints of an=bdominal pain.   Objective: Vitals:   09/01/17 1729 09/01/17 1949 09/01/17 2354 09/02/17 0334  BP: 120/65 (!) 102/58 109/61 (!) 123/58  Pulse:      Resp:  16 18 20   Temp: 98.2 F (36.8 C) 98.4 F (36.9 C) 98.6 F (37 C) 98.1 F (36.7 C)  TempSrc: Oral Oral Oral Oral  SpO2:  93% 91% 94%  Weight:    44 kg (97 lb 0 oz)  Height:        Intake/Output Summary (Last 24 hours) at 09/02/2017 0731 Last data filed at 09/02/2017 0300 Gross per 24 hour  Intake 4303.75 ml  Output -  Net 4303.75 ml   Filed Weights   09/01/17 0828 09/02/17 0334  Weight: 44.5 kg (98 lb) 44 kg (97 lb 0 oz)    Examination:  General exam: Appears calm and comfortable , ill appearing. Thin appearing.  Respiratory system: mild tachypnea, bilateral crackles.  Cardiovascular system:  S 1 , S 2 RR, tachycardia Gastrointestinal system: BS present, soft, mild tender.  Central nervous system: alert.  Extremities: moves all 4 extremities    Data Reviewed: I have personally reviewed following labs and imaging studies  CBC: Recent Labs  Lab 09/01/17 0814 09/01/17 1116 09/01/17 1545 09/01/17 2150 09/02/17 0224  WBC 42.2*  --  32.5* 34.6* 34.0*  NEUTROABS  --  29.2*  --   --   --   HGB 11.9*  --  10.4* 9.7* 9.4*  HCT 38.4  --  32.4* 29.8* 29.6*  MCV 96.2  --  92.6 93.4 93.7  PLT 457*  --  381 344 329   Basic Metabolic Panel: Recent Labs  Lab 09/01/17 0814 09/02/17 0224  NA 141 141  K 2.9* 3.1*  CL 93* 108  CO2 21* 24  GLUCOSE 212* 132*  BUN 38* 43*  CREATININE 1.56* 1.13*  CALCIUM 9.0 7.3*   GFR: Estimated Creatinine Clearance: 24.4 mL/min (A) (by C-G formula based on SCr of 1.13 mg/dL (H)). Liver Function Tests: Recent Labs  Lab 09/01/17 0814  AST 47*  ALT 19  ALKPHOS 62  BILITOT 1.0  PROT 6.5  ALBUMIN 3.1*   Recent Labs  Lab  09/01/17 0814  LIPASE 27   No results for input(s): AMMONIA in the last 168 hours. Coagulation Profile: Recent Labs  Lab 09/01/17 0814 09/01/17 1807  INR 1.12 1.28   Cardiac Enzymes: No results for input(s): CKTOTAL, CKMB, CKMBINDEX, TROPONINI in the last 168 hours. BNP (last 3 results) No results for input(s): PROBNP in the last 8760 hours. HbA1C: No results for input(s): HGBA1C in the last 72 hours. CBG: No results for input(s): GLUCAP in the last 168 hours. Lipid Profile: No results for input(s): CHOL, HDL, LDLCALC, TRIG, CHOLHDL, LDLDIRECT in the last 72 hours. Thyroid Function Tests: No results for input(s): TSH, T4TOTAL, FREET4, T3FREE, THYROIDAB in the last 72 hours. Anemia Panel: No results for input(s): VITAMINB12, FOLATE, FERRITIN, TIBC, IRON, RETICCTPCT in the last 72 hours. Sepsis Labs: Recent Labs  Lab 09/01/17 0841 09/01/17 1244 09/01/17 1251 09/01/17 1545  LATICACIDVEN 12.68* 4.7* 4.87* 4.9*    Recent Results (from the past 240 hour(s))  Gastrointestinal Panel by PCR , Stool     Status: None   Collection Time: 09/01/17  8:54 AM  Result Value Ref Range Status   Campylobacter species NOT DETECTED NOT DETECTED Final   Plesimonas shigelloides NOT DETECTED NOT DETECTED Final   Salmonella species NOT DETECTED NOT DETECTED Final   Yersinia enterocolitica NOT DETECTED NOT DETECTED Final   Vibrio species NOT DETECTED NOT DETECTED Final   Vibrio cholerae NOT DETECTED NOT DETECTED Final   Enteroaggregative E coli (EAEC) NOT DETECTED NOT DETECTED Final   Enteropathogenic E coli (EPEC) NOT DETECTED NOT DETECTED Final   Enterotoxigenic E coli (ETEC) NOT DETECTED NOT DETECTED Final   Shiga like toxin producing E coli (STEC) NOT DETECTED NOT DETECTED Final   Shigella/Enteroinvasive E coli (EIEC) NOT DETECTED NOT DETECTED Final   Cryptosporidium NOT DETECTED NOT DETECTED Final   Cyclospora cayetanensis NOT DETECTED NOT DETECTED Final   Entamoeba histolytica NOT  DETECTED NOT DETECTED Final   Giardia lamblia NOT DETECTED NOT DETECTED Final   Adenovirus F40/41 NOT DETECTED NOT DETECTED Final   Astrovirus NOT DETECTED NOT DETECTED Final   Norovirus GI/GII NOT DETECTED NOT DETECTED Final   Rotavirus A NOT DETECTED NOT DETECTED Final   Sapovirus (I, II, IV, and V) NOT DETECTED NOT DETECTED Final    Comment: Performed at [facility], [address] [place], [place] [address]  C difficile quick scan w PCR reflex     Status: None   Collection Time: 09/01/17  8:54 AM  Result Value Ref Range Status   C Diff antigen NEGATIVE NEGATIVE Final   C Diff toxin NEGATIVE NEGATIVE Final   C Diff interpretation No C. difficile detected.  Final         Radiology Studies: Ct Abdomen Pelvis  Wo Contrast  Result Date: 09/01/2017 CLINICAL DATA:  84 year old female with diffuse abdominal and pelvic pain and vomiting for 1 day. EXAM: CT ABDOMEN AND PELVIS WITHOUT CONTRAST TECHNIQUE: Multidetector CT imaging of the abdomen and pelvis was performed following the standard protocol without IV contrast. COMPARISON:  02/04/2015 and prior CTs FINDINGS: Please note that parenchymal abnormalities may be missed without intravenous contrast. Lower chest: No acute abnormality. Bibasilar scarring noted. A very large hiatal hernia is again identified. Hepatobiliary: No significant hepatic abnormalities. A small amount of gallbladder sludge versus cholelithiasis noted without CT evidence of acute cholecystitis. No biliary dilatation. Pancreas: Atrophic without other significant abnormality Spleen: Unremarkable Adrenals/Urinary Tract: Gas within the bladder wall is noted likely representing emphysematous cystitis. There is no evidence of a gas within the kidneys or renal collecting systems. No hydronephrosis. The kidneys and adrenal glands are unremarkable. Stomach/Bowel: Diffuse circumferential wall thickening of the descending, sigmoid and distal transverse colon noted compatible  with a colitis. There is no evidence of bowel obstruction or pneumoperitoneum. Diffuse colonic diverticulosis noted. Vascular/Lymphatic: Aortic atherosclerosis. No enlarged abdominal or pelvic lymph nodes. Reproductive: Status post hysterectomy. No adnexal masses. Other: No ascites or focal collection. Musculoskeletal: A 50% superior endplate compression fracture of L3 is identified which is new since 2016 but does not appear acute. Mild bony retropulsion is noted. IMPRESSION: 1. Diffuse wall thickening of the descending, sigmoid and distal transverse colon compatible with colitis. No bowel obstruction or pneumoperitoneum. 2. Gas within the bladder wall likely representing emphysematous cystitis. No evidence of gas within the kidneys or renal collecting systems. 3. 50% L3 compression fracture -new since 2016 but does not appear acute. Correlate clinically. 4. Gallbladder sludge versus cholelithiasis. No CT evidence of acute cholecystitis 5.  Aortic Atherosclerosis (ICD10-I70.0). Electronically Signed   By: Harmon PierJeffrey  Hu M.D.   On: 09/01/2017 11:06   Dg Chest Port 1 View  Result Date: 09/01/2017 CLINICAL DATA:  Vomiting blood. EXAM: PORTABLE CHEST 1 VIEW COMPARISON:  Chest radiograph 09/01/2017. FINDINGS: Monitoring leads overlie the patient. Stable cardiac and mediastinal contours with tortuosity of the thoracic aorta. Large hiatal hernia. Heterogeneous opacities left lung base. Probable small left pleural effusion. IMPRESSION: Heterogeneous opacities left lung base may represent atelectasis or infection. Possible small left pleural effusion. Large hiatal hernia. Electronically Signed   By: Annia Beltrew  Davis M.D.   On: 09/01/2017 17:05   Dg Chest Port 1 View  Result Date: 09/01/2017 CLINICAL DATA:  Patient with vomiting. EXAM: PORTABLE CHEST 1 VIEW COMPARISON:  Chest radiograph 03/08/2017. FINDINGS: Monitoring leads overlie the patient. Stable cardiac and mediastinal contours. Thoracic aortic vascular calcifications.  Large hiatal hernia. No consolidative pulmonary opacities. No pleural effusion or pneumothorax. IMPRESSION: No acute cardiopulmonary process.  Large hiatal hernia. Electronically Signed   By: Annia Beltrew  Davis M.D.   On: 09/01/2017 08:59        Scheduled Meds: . ondansetron (ZOFRAN) IV  4 mg Intravenous Q4H   Continuous Infusions: . sodium chloride 50 mL/hr at 09/02/17 0419  . pantoprozole (PROTONIX) infusion 8 mg/hr (09/01/17 2223)  . piperacillin-tazobactam (ZOSYN)  IV Stopped (09/02/17 0329)     LOS: 1 day    Time spent: 35 minutes.     Alba CoryBelkys A Twylah Bennetts, MD [facility] Pager [phone]  If 7PM-7AM, please contact night-coverage [email or web address] Password [email or web address] 09/02/2017, 7:31 AM

## 2017-09-02 NOTE — Progress Notes (Signed)
Subjective: Feeling better.  Objective: Vital signs in last 24 hours: Temp:  [98.1 F (36.7 C)-100.5 F (38.1 C)] 98.1 F (36.7 C) (01/06 0334) Pulse Rate:  [120-148] 135 (01/05 1700) Resp:  [12-42] 20 (01/06 0334) BP: (102-164)/(58-97) 123/58 (01/06 0334) SpO2:  [91 %-96 %] 94 % (01/06 0334) Weight:  [44 kg (97 lb 0 oz)-44.5 kg (98 lb)] 44 kg (97 lb 0 oz) (01/06 0334) Last BM Date: 09/01/17  Intake/Output from previous day: 01/05 0701 - 01/06 0700 In: 4303.8 [I.V.:1818.8; IV Piggyback:2485] Out: -  Intake/Output this shift: No intake/output data recorded.  General appearance: weak appearing, but not as ill appearing as yesterday GI: tenderness has markedly improved.  Lab Results: Recent Labs    09/01/17 1545 09/01/17 2150 09/02/17 0224  WBC 32.5* 34.6* 34.0*  HGB 10.4* 9.7* 9.4*  HCT 32.4* 29.8* 29.6*  PLT 381 344 329   BMET Recent Labs    09/01/17 0814 09/02/17 0224  NA 141 141  K 2.9* 3.1*  CL 93* 108  CO2 21* 24  GLUCOSE 212* 132*  BUN 38* 43*  CREATININE 1.56* 1.13*  CALCIUM 9.0 7.3*   LFT Recent Labs    09/01/17 0814  PROT 6.5  ALBUMIN 3.1*  AST 47*  ALT 19  ALKPHOS 62  BILITOT 1.0   PT/INR Recent Labs    09/01/17 0814 09/01/17 1807  LABPROT 14.3 15.8*  INR 1.12 1.28   Hepatitis Panel No results for input(s): HEPBSAG, HCVAB, HEPAIGM, HEPBIGM in the last 72 hours. C-Diff Recent Labs    09/01/17 0854  CDIFFTOX NEGATIVE   Fecal Lactopherrin No results for input(s): FECLLACTOFRN in the last 72 hours.  Studies/Results: Ct Abdomen Pelvis Wo Contrast  Result Date: 09/01/2017 CLINICAL DATA:  84 year old female with diffuse abdominal and pelvic pain and vomiting for 1 day. EXAM: CT ABDOMEN AND PELVIS WITHOUT CONTRAST TECHNIQUE: Multidetector CT imaging of the abdomen and pelvis was performed following the standard protocol without IV contrast. COMPARISON:  02/04/2015 and prior CTs FINDINGS: Please note that parenchymal abnormalities may  be missed without intravenous contrast. Lower chest: No acute abnormality. Bibasilar scarring noted. A very large hiatal hernia is again identified. Hepatobiliary: No significant hepatic abnormalities. A small amount of gallbladder sludge versus cholelithiasis noted without CT evidence of acute cholecystitis. No biliary dilatation. Pancreas: Atrophic without other significant abnormality Spleen: Unremarkable Adrenals/Urinary Tract: Gas within the bladder wall is noted likely representing emphysematous cystitis. There is no evidence of a gas within the kidneys or renal collecting systems. No hydronephrosis. The kidneys and adrenal glands are unremarkable. Stomach/Bowel: Diffuse circumferential wall thickening of the descending, sigmoid and distal transverse colon noted compatible with a colitis. There is no evidence of bowel obstruction or pneumoperitoneum. Diffuse colonic diverticulosis noted. Vascular/Lymphatic: Aortic atherosclerosis. No enlarged abdominal or pelvic lymph nodes. Reproductive: Status post hysterectomy. No adnexal masses. Other: No ascites or focal collection. Musculoskeletal: A 50% superior endplate compression fracture of L3 is identified which is new since 2016 but does not appear acute. Mild bony retropulsion is noted. IMPRESSION: 1. Diffuse wall thickening of the descending, sigmoid and distal transverse colon compatible with colitis. No bowel obstruction or pneumoperitoneum. 2. Gas within the bladder wall likely representing emphysematous cystitis. No evidence of gas within the kidneys or renal collecting systems. 3. 50% L3 compression fracture -new since 2016 but does not appear acute. Correlate clinically. 4. Gallbladder sludge versus cholelithiasis. No CT evidence of acute cholecystitis 5.  Aortic Atherosclerosis (ICD10-I70.0). Electronically Signed   By: Tinnie Gens  Hu M.D.   On: 09/01/2017 11:06   Dg Chest Port 1 View  Result Date: 09/01/2017 CLINICAL DATA:  Vomiting blood. EXAM: PORTABLE  CHEST 1 VIEW COMPARISON:  Chest radiograph 09/01/2017. FINDINGS: Monitoring leads overlie the patient. Stable cardiac and mediastinal contours with tortuosity of the thoracic aorta. Large hiatal hernia. Heterogeneous opacities left lung base. Probable small left pleural effusion. IMPRESSION: Heterogeneous opacities left lung base may represent atelectasis or infection. Possible small left pleural effusion. Large hiatal hernia. Electronically Signed   By: Annia Beltrew  Davis M.D.   On: 09/01/2017 17:05   Dg Chest Port 1 View  Result Date: 09/01/2017 CLINICAL DATA:  Patient with vomiting. EXAM: PORTABLE CHEST 1 VIEW COMPARISON:  Chest radiograph 03/08/2017. FINDINGS: Monitoring leads overlie the patient. Stable cardiac and mediastinal contours. Thoracic aortic vascular calcifications. Large hiatal hernia. No consolidative pulmonary opacities. No pleural effusion or pneumothorax. IMPRESSION: No acute cardiopulmonary process.  Large hiatal hernia. Electronically Signed   By: Annia Beltrew  Davis M.D.   On: 09/01/2017 08:59    Medications:  Scheduled: . ondansetron (ZOFRAN) IV  4 mg Intravenous Q4H   Continuous: . sodium chloride    . acetaminophen    . dextrose 5 % and 0.9% NaCl 75 mL/hr at 09/02/17 0850  . pantoprozole (PROTONIX) infusion 8 mg/hr (09/01/17 2223)  . piperacillin-tazobactam (ZOSYN)  IV Stopped (09/02/17 0329)    Assessment/Plan: 1) Coffee-ground emesis. 2) Anemia. 3) Leukocytosis. 4) Hypoxemia.   She is clinically better from the GI standpoint.  No significant issues with nausea and vomiting overnight and her HGB was relatively stable.  Mild trend downwards, but hemodynamically stable.  Her abdominal examination is much better today, but she has a significant and persistent leukocytosis with antibiotics.  There is no evidence of an acute abdomen at this time.  Her pulmonary status is still very poor with marginal saturation in the 91-93% on 3 liters of [place].  Plan: 1) Continue supportive care for  now. 2) Continue to hold on any endoscopic procedures.  LOS: 1 day   Quisha Mabie D 09/02/2017, 8:04 AM

## 2017-09-03 LAB — GLUCOSE, CAPILLARY
GLUCOSE-CAPILLARY: 110 mg/dL — AB (ref 65–99)
Glucose-Capillary: 109 mg/dL — ABNORMAL HIGH (ref 65–99)
Glucose-Capillary: 112 mg/dL — ABNORMAL HIGH (ref 65–99)
Glucose-Capillary: 113 mg/dL — ABNORMAL HIGH (ref 65–99)
Glucose-Capillary: 119 mg/dL — ABNORMAL HIGH (ref 65–99)
Glucose-Capillary: 120 mg/dL — ABNORMAL HIGH (ref 65–99)

## 2017-09-03 LAB — CBC
HCT: 32.6 % — ABNORMAL LOW (ref 36.0–46.0)
Hemoglobin: 10.2 g/dL — ABNORMAL LOW (ref 12.0–15.0)
MCH: 28.6 pg (ref 26.0–34.0)
MCHC: 31.3 g/dL (ref 30.0–36.0)
MCV: 91.3 fL (ref 78.0–100.0)
PLATELETS: 250 10*3/uL (ref 150–400)
RBC: 3.57 MIL/uL — AB (ref 3.87–5.11)
RDW: 18.1 % — AB (ref 11.5–15.5)
WBC: 16.2 10*3/uL — ABNORMAL HIGH (ref 4.0–10.5)

## 2017-09-03 LAB — BASIC METABOLIC PANEL
Anion gap: 5 (ref 5–15)
BUN: 14 mg/dL (ref 6–20)
CO2: 24 mmol/L (ref 22–32)
CREATININE: 0.53 mg/dL (ref 0.44–1.00)
Calcium: 7.4 mg/dL — ABNORMAL LOW (ref 8.9–10.3)
Chloride: 116 mmol/L — ABNORMAL HIGH (ref 101–111)
GFR calc Af Amer: >60 mL/min (ref >60)
GLUCOSE: 114 mg/dL — AB (ref 65–99)
POTASSIUM: 3.1 mmol/L — AB (ref 3.5–5.1)
Sodium: 145 mmol/L (ref 135–145)

## 2017-09-03 LAB — HEMOGLOBIN AND HEMATOCRIT, BLOOD
HCT: 25.2 % — ABNORMAL LOW (ref 36.0–46.0)
HCT: 31.1 % — ABNORMAL LOW (ref 36.0–46.0)
Hemoglobin: 7.9 g/dL — ABNORMAL LOW (ref 12.0–15.0)
Hemoglobin: 10 g/dL — ABNORMAL LOW (ref 12.0–15.0)

## 2017-09-03 LAB — PREPARE RBC (CROSSMATCH)

## 2017-09-03 MED ORDER — MAGNESIUM SULFATE 2 GM/50ML IV SOLN
2.0000 g | Freq: Once | INTRAVENOUS | Status: AC
  Administered 2017-09-03: 2 g via INTRAVENOUS
  Filled 2017-09-03: qty 50

## 2017-09-03 MED ORDER — POTASSIUM CHLORIDE 10 MEQ/100ML IV SOLN
10.0000 meq | INTRAVENOUS | Status: AC
  Administered 2017-09-03 (×3): 10 meq via INTRAVENOUS
  Filled 2017-09-03 (×3): qty 100

## 2017-09-03 MED ORDER — SODIUM CHLORIDE 0.9 % IV SOLN
Freq: Once | INTRAVENOUS | Status: AC
  Administered 2017-09-03: 09:00:00 via INTRAVENOUS

## 2017-09-03 MED ORDER — PIPERACILLIN-TAZOBACTAM 3.375 G IVPB
3.3750 g | Freq: Three times a day (TID) | INTRAVENOUS | Status: DC
  Administered 2017-09-03 – 2017-09-08 (×15): 3.375 g via INTRAVENOUS
  Filled 2017-09-03 (×19): qty 50

## 2017-09-03 MED ORDER — MORPHINE SULFATE (PF) 2 MG/ML IV SOLN
0.5000 mg | INTRAVENOUS | Status: DC | PRN
  Administered 2017-09-03 – 2017-09-06 (×14): 1 mg via INTRAVENOUS
  Administered 2017-09-06: 0.5 mg via INTRAVENOUS
  Administered 2017-09-06 – 2017-09-13 (×32): 1 mg via INTRAVENOUS
  Filled 2017-09-03 (×47): qty 1

## 2017-09-03 MED ORDER — MORPHINE SULFATE (PF) 2 MG/ML IV SOLN: 0.5000 mg | INTRAVENOUS | Status: DC | PRN

## 2017-09-03 NOTE — Progress Notes (Signed)
Pharmacy Antibiotic Note  Nicole Key is a 84 y.o. female admitted on 09/01/2017 with intra-abdominal infection.  Pharmacy has been consulted for Zosyn dosing.  Scr trending down to 1.13 (CrCl ~23 mL/min using actual body weight). Cultures continue to remain negative. WBC remained elevated at 34 upon last check. Currently on day #4 of therapy.   Plan: Adjust Zosyn to 3.375 g every 8 hours  Monitor clinical picture, renal function F/U C&S, abx deescalation / LOT   Height: 5\' 8"  (172.7 cm) Weight: 95 lb 3.8 oz (43.2 kg) IBW/kg (Calculated) : 63.9  Temp (24hrs), Avg:98.5 F (36.9 C), Min:97.5 F (36.4 C), Max:99.4 F (37.4 C)  Recent Labs  Lab 09/01/17 0814 09/01/17 0841 09/01/17 1244 09/01/17 1251 09/01/17 1545 09/01/17 2150 09/02/17 0224  WBC 42.2*  --   --   --  32.5* 34.6* 34.0*  CREATININE 1.56*  --   --   --   --   --  1.13*  LATICACIDVEN  --  12.68* 4.7* 4.87* 4.9*  --   --     Estimated Creatinine Clearance: 23.9 mL/min (A) (by C-G formula based on SCr of 1.13 mg/dL (H)).    Allergies  Allergen Reactions  . Shellfish Allergy Hives   Thank you for allowing pharmacy to be a part of this patient's care.  Girard CooterKimberly Perkins, PharmD Clinical Pharmacist  Pager: [phone] Clinical Phone for 09/03/2017 until 3:30pm: [phone] If after 3:30pm, please call main pharmacy at [phone] 09/03/2017 1:22 PM

## 2017-09-03 NOTE — Progress Notes (Signed)
PROGRESS NOTE    BRENYA Key  ZOX:[id number] DOB: 10-29-1927 DOA: 09/01/2017 PCP: Geoffry Paradise, MD   Brief Narrative: Nicole Key is a 84 y.o. female with medical history significant for large hiatal hernia with history of incarcerated hiatal hernia with obstruction 2016, aspiration pneumonia hypertension, fibromyalgia, anxiety, GERD, malnutrition presents to the emergency Department chief complaint intractable nausea and vomiting hematemesis. Initial evaluation reveals elevated lactic acid hypokalemia, leukocytosis, tachycardia, acute kidney injury possible urinary tract infection. [facility] are asked to admit   Assessment & Plan:   Principal Problem:   Sepsis (HCC) Active Problems:   Meniere's disease   Anxiety   Essential hypertension   GERD (gastroesophageal reflux disease)   Hiatal hernia   AKI (acute kidney injury) (HCC)   GI bleed   Hematemesis   Melena   Hypokalemia   Leukocytosis   Elevated lactic acid level   Intractable nausea and vomiting   Tachycardia  1-Sepsis; PNA vs colitis.  Presents with leukocytosis , lactic acidosis.  WBC trending down.  Lactic acid decrease from 12---4.9 Continue with Zosyn.  Chest x ray bibasilar atelectasis/  WBC pending for the morning   GI bleed, hematemesis.  Hb drop from 11 to 9.4 on admission.  Transfused  one unit PRBC 1-06 Protonix gtt.  Cycle hb.  Hb down to 7.9 this am. Will transfuse another unit PRBC  Acute encephalopathy;  She is lethargic, only moan.  Suspect related to acute illness.   Colitis;  C diff negative.  GI pathogen negative.  diarrhea, bloody stool.  Support care, IV fluids antibiotics,   Hypokalemia;  Improved.  Replete IV   Acute hypoxic respiratory failure;  Chest x ray, atelectasis vs infection.  Per nurse patient cough phlegm , brownish, after she was lying down.   AKI; in setting of sepsis, hypovolemia.  Cr peak to 1.5. Follow trend.    HTN; tachycardia;  IV  Metoprolol PRN.   Fibromyalgia. Appears stable at baseline.     DVT prophylaxis: SCD Code Status: Partial code.  Family Communication: grandson at bedside.  Disposition Plan: Remain in the step down unit.   Consultants:   GI   Palliative care consult    Procedures:   none   Antimicrobials:  Zosyn 09-01-2017   Subjective: She is lethargic, moan. No responding to questions.  Patient appears in pain. Per grandson she got morphine yesterday, and help with pain. She was lethargic yesterday also. He understand risk of morphine , he does not wants his grandmother to suffer.   Objective: Vitals:   09/03/17 0722 09/03/17 0900 09/03/17 0919 09/03/17 1200  BP: (!) 138/55 (!) 151/57 (!) 150/61 (!) 161/64  Pulse: 83 87 87   Resp: 20 20 (!) 22 (!) 23  Temp: (!) 97.5 F (36.4 C) 98.2 F (36.8 C) 98.7 F (37.1 C) 98.2 F (36.8 C)  TempSrc: Oral Axillary Axillary Axillary  SpO2:  98% 97% 100%  Weight:      Height:        Intake/Output Summary (Last 24 hours) at 09/03/2017 1329 Last data filed at 09/03/2017 1200 Gross per 24 hour  Intake 1165 ml  Output 0 ml  Net 1165 ml   Filed Weights   09/01/17 0828 09/02/17 0334 09/03/17 0427  Weight: 44.5 kg (98 lb) 44 kg (97 lb 0 oz) 43.2 kg (95 lb 3.8 oz)    Examination:  General exam:lethargic, ill appearing.  Respiratory system:tachypnea, bilateral crackles.  Cardiovascular system:  S 1 , S  2 RR, tachycardia Gastrointestinal system: bs soft, mild tender,  Central nervous system: Lethargic Extremities: no edema    Data Reviewed: I have personally reviewed following labs and imaging studies  CBC: Recent Labs  Lab 09/01/17 0814 09/01/17 1116 09/01/17 1545 09/01/17 2150 09/02/17 0224 09/02/17 1516 09/02/17 2134 09/03/17 0258  WBC 42.2*  --  32.5* 34.6* 34.0*  --   --   --   NEUTROABS  --  29.2*  --   --   --   --   --   --   HGB 11.9*  --  10.4* 9.7* 9.4* 9.1* 9.8* 7.9*  HCT 38.4  --  32.4* 29.8* 29.6* 28.7* 29.9*  25.2*  MCV 96.2  --  92.6 93.4 93.7  --   --   --   PLT 457*  --  381 344 329  --   --   --    Basic Metabolic Panel: Recent Labs  Lab 09/01/17 0814 09/02/17 0224 09/02/17 1526  NA 141 141  --   K 2.9* 3.1*  --   CL 93* 108  --   CO2 21* 24  --   GLUCOSE 212* 132*  --   BUN 38* 43*  --   CREATININE 1.56* 1.13*  --   CALCIUM 9.0 7.3*  --   MG  --   --  1.4*   GFR: Estimated Creatinine Clearance: 23.9 mL/min (A) (by C-G formula based on SCr of 1.13 mg/dL (H)). Liver Function Tests: Recent Labs  Lab 09/01/17 0814  AST 47*  ALT 19  ALKPHOS 62  BILITOT 1.0  PROT 6.5  ALBUMIN 3.1*   Recent Labs  Lab 09/01/17 0814  LIPASE 27   No results for input(s): AMMONIA in the last 168 hours. Coagulation Profile: Recent Labs  Lab 09/01/17 0814 09/01/17 1807  INR 1.12 1.28   Cardiac Enzymes: No results for input(s): CKTOTAL, CKMB, CKMBINDEX, TROPONINI in the last 168 hours. BNP (last 3 results) No results for input(s): PROBNP in the last 8760 hours. HbA1C: No results for input(s): HGBA1C in the last 72 hours. CBG: Recent Labs  Lab 09/02/17 2009 09/03/17 0055 09/03/17 0420 09/03/17 0837 09/03/17 1136  GLUCAP 124* 119* 120* 113* 109*   Lipid Profile: No results for input(s): CHOL, HDL, LDLCALC, TRIG, CHOLHDL, LDLDIRECT in the last 72 hours. Thyroid Function Tests: No results for input(s): TSH, T4TOTAL, FREET4, T3FREE, THYROIDAB in the last 72 hours. Anemia Panel: No results for input(s): VITAMINB12, FOLATE, FERRITIN, TIBC, IRON, RETICCTPCT in the last 72 hours. Sepsis Labs: Recent Labs  Lab 09/01/17 0841 09/01/17 1244 09/01/17 1251 09/01/17 1545  LATICACIDVEN 12.68* 4.7* 4.87* 4.9*    Recent Results (from the past 240 hour(s))  Gastrointestinal Panel by PCR , Stool     Status: None   Collection Time: 09/01/17  8:54 AM  Result Value Ref Range Status   Campylobacter species NOT DETECTED NOT DETECTED Final   Plesimonas shigelloides NOT DETECTED NOT DETECTED  Final   Salmonella species NOT DETECTED NOT DETECTED Final   Yersinia enterocolitica NOT DETECTED NOT DETECTED Final   Vibrio species NOT DETECTED NOT DETECTED Final   Vibrio cholerae NOT DETECTED NOT DETECTED Final   Enteroaggregative E coli (EAEC) NOT DETECTED NOT DETECTED Final   Enteropathogenic E coli (EPEC) NOT DETECTED NOT DETECTED Final   Enterotoxigenic E coli (ETEC) NOT DETECTED NOT DETECTED Final   Shiga like toxin producing E coli (STEC) NOT DETECTED NOT DETECTED Final   Shigella/Enteroinvasive E coli (  EIEC) NOT DETECTED NOT DETECTED Final   Cryptosporidium NOT DETECTED NOT DETECTED Final   Cyclospora cayetanensis NOT DETECTED NOT DETECTED Final   Entamoeba histolytica NOT DETECTED NOT DETECTED Final   Giardia lamblia NOT DETECTED NOT DETECTED Final   Adenovirus F40/41 NOT DETECTED NOT DETECTED Final   Astrovirus NOT DETECTED NOT DETECTED Final   Norovirus GI/GII NOT DETECTED NOT DETECTED Final   Rotavirus A NOT DETECTED NOT DETECTED Final   Sapovirus (I, II, IV, and V) NOT DETECTED NOT DETECTED Final    Comment: Performed at [facility], [address] [place], [place] [address]  C difficile quick scan w PCR reflex     Status: None   Collection Time: 09/01/17  8:54 AM  Result Value Ref Range Status   C Diff antigen NEGATIVE NEGATIVE Final   C Diff toxin NEGATIVE NEGATIVE Final   C Diff interpretation No C. difficile detected.  Final  Culture, blood (routine x 2)     Status: None (Preliminary result)   Collection Time: 09/01/17  9:06 AM  Result Value Ref Range Status   Specimen Description BLOOD RIGHT FOREARM  Final   Special Requests   Final    BOTTLES DRAWN AEROBIC AND ANAEROBIC Blood Culture results may not be optimal due to an inadequate volume of blood received in culture bottles   Culture NO GROWTH 2 DAYS  Final   Report Status PENDING  Incomplete  Culture, blood (routine x 2)     Status: None (Preliminary result)   Collection Time: 09/01/17 11:16 AM    Result Value Ref Range Status   Specimen Description BLOOD RIGHT ANTECUBITAL  Final   Special Requests   Final    BOTTLES DRAWN AEROBIC AND ANAEROBIC Blood Culture results may not be optimal due to an inadequate volume of blood received in culture bottles   Culture NO GROWTH 2 DAYS  Final   Report Status PENDING  Incomplete         Radiology Studies: Dg Chest Port 1 View  Result Date: 09/02/2017 CLINICAL DATA:  Cough and chest pain EXAM: PORTABLE CHEST 1 VIEW COMPARISON:  09/01/2017 and prior exams FINDINGS: Cardiomegaly, large hiatal hernia and left basilar atelectasis again noted. No pneumothorax or significant changes are noted since the prior study. No acute bony abnormalities are present. IMPRESSION: Unchanged appearance the chest with cardiomegaly, large hiatal hernia and left basilar atelectasis. Electronically Signed   By: Harmon Pier M.D.   On: 09/02/2017 11:18   Dg Chest Port 1 View  Result Date: 09/01/2017 CLINICAL DATA:  Vomiting blood. EXAM: PORTABLE CHEST 1 VIEW COMPARISON:  Chest radiograph 09/01/2017. FINDINGS: Monitoring leads overlie the patient. Stable cardiac and mediastinal contours with tortuosity of the thoracic aorta. Large hiatal hernia. Heterogeneous opacities left lung base. Probable small left pleural effusion. IMPRESSION: Heterogeneous opacities left lung base may represent atelectasis or infection. Possible small left pleural effusion. Large hiatal hernia. Electronically Signed   By: Annia Belt M.D.   On: 09/01/2017 17:05        Scheduled Meds:  Continuous Infusions: . dextrose 5 % and 0.9% NaCl 75 mL/hr at 09/02/17 2313  . pantoprozole (PROTONIX) infusion 8 mg/hr (09/02/17 2010)  . piperacillin-tazobactam (ZOSYN)  IV       LOS: 2 days    Time spent: 35 minutes.     Alba Cory, MD [facility] Pager [phone]  If 7PM-7AM, please contact night-coverage [email or web address] Password [email or web address] 09/03/2017, 1:28 PM

## 2017-09-03 NOTE — Progress Notes (Signed)
UNASSIGNED PATIENT  Subjective: 84 year old white female with multiple medical problems including a large hiatal hernia,  aspiration pneumonia hypertension and fibromyalgia anxiety GERD severe malnutrition, presented to the emergency room with nausea vomiting hematemesis. Due to her acute comorbidities and endoscopy has not been possible. Patient's hemoglobin dropped today she again. She landed and unable to communicate and as per her family was at the bedside she is very weak. She has been NPO since admission. As per my conversation with her nurse she has not had any overt hematochezia melena started a bowel movement today.  Objective: Vital signs in last 24 hours: Temp:  [97.5 F (36.4 C)-99.4 F (37.4 C)] 98.6 F (37 C) (01/07 1417) Pulse Rate:  [73-87] 73 (01/07 1417) Resp:  [20-31] 20 (01/07 1417) BP: (118-174)/(54-93) 174/74 (01/07 1417) SpO2:  [96 %-100 %] 98 % (01/07 1417) Weight:  [43.2 kg (95 lb 3.8 oz)] 43.2 kg (95 lb 3.8 oz) (01/07 0427) Last BM Date: 09/02/17  Intake/Output from previous day: 01/06 0701 - 01/07 0700 In: 1684 [I.V.:1000; Blood:584; IV Piggyback:100] Out: 0  Intake/Output this shift: Total I/O In: 315 [Blood:315] Out: -   General appearance: appears stated age, cachectic, fatigued, moderate distress, pale and slowed mentation Resp: diminished breath sounds bilaterally Cardio: S1, S2 regular, tachycardic GI: soft, non-tender; bowel sounds normal; no masses,  no organomegaly Extremities: extremities normal, atraumatic, no cyanosis or edema  Lab Results: Recent Labs    09/01/17 2150 09/02/17 0224  09/02/17 2134 09/03/17 0258 09/03/17 1407  WBC 34.6* 34.0*  --   --   --  16.2*  HGB 9.7* 9.4*   < > 9.8* 7.9* 10.2*  HCT 29.8* 29.6*   < > 29.9* 25.2* 32.6*  PLT 344 329  --   --   --  250   < > = values in this interval not displayed.   BMET Recent Labs    09/01/17 0814 09/02/17 0224 09/03/17 1407  NA 141 141 145  K 2.9* 3.1* 3.1*  CL 93* 108  116*  CO2 21* 24 24  GLUCOSE 212* 132* 114*  BUN 38* 43* 14  CREATININE 1.56* 1.13* 0.53  CALCIUM 9.0 7.3* 7.4*   LFT Recent Labs    09/01/17 0814  PROT 6.5  ALBUMIN 3.1*  AST 47*  ALT 19  ALKPHOS 62  BILITOT 1.0   PT/INR Recent Labs    09/01/17 0814 09/01/17 1807  LABPROT 14.3 15.8*  INR 1.12 1.28   Hepatitis Panel No results for input(s): HEPBSAG, HCVAB, HEPAIGM, HEPBIGM in the last 72 hours. C-Diff Recent Labs    09/01/17 0854  CDIFFTOX NEGATIVE   Studies/Results: Dg Chest Port 1 View  Result Date: 09/02/2017 CLINICAL DATA:  Cough and chest pain EXAM: PORTABLE CHEST 1 VIEW COMPARISON:  09/01/2017 and prior exams FINDINGS: Cardiomegaly, large hiatal hernia and left basilar atelectasis again noted. No pneumothorax or significant changes are noted since the prior study. No acute bony abnormalities are present. IMPRESSION: Unchanged appearance the chest with cardiomegaly, large hiatal hernia and left basilar atelectasis. Electronically Signed   By: Harmon Pier M.D.   On: 09/02/2017 11:18   Dg Chest Port 1 View  Result Date: 09/01/2017 CLINICAL DATA:  Vomiting blood. EXAM: PORTABLE CHEST 1 VIEW COMPARISON:  Chest radiograph 09/01/2017. FINDINGS: Monitoring leads overlie the patient. Stable cardiac and mediastinal contours with tortuosity of the thoracic aorta. Large hiatal hernia. Heterogeneous opacities left lung base. Probable small left pleural effusion. IMPRESSION: Heterogeneous opacities left lung base may  represent atelectasis or infection. Possible small left pleural effusion. Large hiatal hernia. Electronically Signed   By: Drew  Annia Beltavis M.D.   On: 09/01/2017 17:05   Medications: I have reviewed the patient's current medications.  Assessment/Plan: 1) Nausea, vomiting and he vomiting and hematemesis with a history of a large hiatal hernia-anemia presumed to be secondary to an upper GI bleed. Due to patient's multiple comorbidities no invasive procedures can get this  done at this time. Agree with the palliative care consult patient needs to be treated supportively. Transfuse as needed.  2) ?Sepsis possibly from pneumonia on broad-spectrum antibiotics lactic acidosis seems to be improving. Continue Zosyn.  3) Diarrhea with bloody stools. No BMs today will [place] follow closely  4) Acute hypoxic respiratory failure.  5) AKI.  6) Palliative care consult pending.  LOS: 2 days   Quanda Pavlicek 09/03/2017, 3:36 PM

## 2017-09-04 LAB — BPAM RBC
BLOOD PRODUCT EXPIRATION DATE: 201902012359
Blood Product Expiration Date: 201901162359
ISSUE DATE / TIME: 201901061007
ISSUE DATE / TIME: 201901070839
UNIT TYPE AND RH: 5100
Unit Type and Rh: 5100

## 2017-09-04 LAB — GLUCOSE, CAPILLARY
GLUCOSE-CAPILLARY: 110 mg/dL — AB (ref 65–99)
GLUCOSE-CAPILLARY: 82 mg/dL (ref 65–99)
GLUCOSE-CAPILLARY: 96 mg/dL (ref 65–99)
Glucose-Capillary: 106 mg/dL — ABNORMAL HIGH (ref 65–99)
Glucose-Capillary: 93 mg/dL (ref 65–99)

## 2017-09-04 LAB — BASIC METABOLIC PANEL
ANION GAP: 7 (ref 5–15)
BUN: 10 mg/dL (ref 6–20)
CALCIUM: 7.4 mg/dL — AB (ref 8.9–10.3)
CO2: 23 mmol/L (ref 22–32)
CREATININE: 0.49 mg/dL (ref 0.44–1.00)
Chloride: 114 mmol/L — ABNORMAL HIGH (ref 101–111)
Glucose, Bld: 99 mg/dL (ref 65–99)
Potassium: 3.5 mmol/L (ref 3.5–5.1)
Sodium: 144 mmol/L (ref 135–145)

## 2017-09-04 LAB — TYPE AND SCREEN
ABO/RH(D): O POS
ANTIBODY SCREEN: NEGATIVE
Unit division: 0
Unit division: 0

## 2017-09-04 LAB — CBC
HCT: 33.7 % — ABNORMAL LOW (ref 36.0–46.0)
Hemoglobin: 10.9 g/dL — ABNORMAL LOW (ref 12.0–15.0)
MCH: 30.4 pg (ref 26.0–34.0)
MCHC: 32.3 g/dL (ref 30.0–36.0)
MCV: 93.9 fL (ref 78.0–100.0)
PLATELETS: 275 10*3/uL (ref 150–400)
RBC: 3.59 MIL/uL — ABNORMAL LOW (ref 3.87–5.11)
RDW: 18.7 % — AB (ref 11.5–15.5)
WBC: 13.3 10*3/uL — AB (ref 4.0–10.5)

## 2017-09-04 LAB — HEMOGLOBIN AND HEMATOCRIT, BLOOD
HEMATOCRIT: 34.4 % — AB (ref 36.0–46.0)
HEMOGLOBIN: 10.8 g/dL — AB (ref 12.0–15.0)

## 2017-09-04 LAB — MAGNESIUM: Magnesium: 2 mg/dL (ref 1.7–2.4)

## 2017-09-04 MED ORDER — PANTOPRAZOLE SODIUM 40 MG IV SOLR
40.0000 mg | Freq: Two times a day (BID) | INTRAVENOUS | Status: DC
  Administered 2017-09-04 – 2017-09-06 (×6): 40 mg via INTRAVENOUS
  Filled 2017-09-04 (×6): qty 40

## 2017-09-04 NOTE — Progress Notes (Signed)
Subjective: No acute events.  Objective: Vital signs in last 24 hours: Temp:  [97.5 F (36.4 C)-98.7 F (37.1 C)] 98.2 F (36.8 C) (01/08 0400) Pulse Rate:  [73-87] 73 (01/07 1417) Resp:  [17-23] 17 (01/08 0000) BP: (138-174)/(55-74) 146/62 (01/08 0000) SpO2:  [97 %-100 %] 100 % (01/08 0000) Last BM Date: 09/02/17  Intake/Output from previous day: 01/07 0701 - 01/08 0700 In: 2615 [I.V.:2250; Blood:315; IV Piggyback:50] Out: 500 [Urine:500] Intake/Output this shift: Total I/O In: 1150 [I.V.:1100; IV Piggyback:50] Out: 200 [Urine:200]  General appearance: sleeping GI: soft  Lab Results: Recent Labs    09/01/17 2150 09/02/17 0224  09/03/17 1407 09/03/17 2119 09/04/17 0316  WBC 34.6* 34.0*  --  16.2*  --   --   HGB 9.7* 9.4*   < > 10.2* 10.0* 10.8*  HCT 29.8* 29.6*   < > 32.6* 31.1* 34.4*  PLT 344 329  --  250  --   --    < > = values in this interval not displayed.   BMET Recent Labs    09/01/17 0814 09/02/17 0224 09/03/17 1407  NA 141 141 145  K 2.9* 3.1* 3.1*  CL 93* 108 116*  CO2 21* 24 24  GLUCOSE 212* 132* 114*  BUN 38* 43* 14  CREATININE 1.56* 1.13* 0.53  CALCIUM 9.0 7.3* 7.4*   LFT Recent Labs    09/01/17 0814  PROT 6.5  ALBUMIN 3.1*  AST 47*  ALT 19  ALKPHOS 62  BILITOT 1.0   PT/INR Recent Labs    09/01/17 0814 09/01/17 1807  LABPROT 14.3 15.8*  INR 1.12 1.28   Hepatitis Panel No results for input(s): HEPBSAG, HCVAB, HEPAIGM, HEPBIGM in the last 72 hours. C-Diff Recent Labs    09/01/17 0854  CDIFFTOX NEGATIVE   Fecal Lactopherrin No results for input(s): FECLLACTOFRN in the last 72 hours.  Studies/Results: Dg Chest Port 1 View  Result Date: 09/02/2017 CLINICAL DATA:  Cough and chest pain EXAM: PORTABLE CHEST 1 VIEW COMPARISON:  09/01/2017 and prior exams FINDINGS: Cardiomegaly, large hiatal hernia and left basilar atelectasis again noted. No pneumothorax or significant changes are noted since the prior study. No acute bony  abnormalities are present. IMPRESSION: Unchanged appearance the chest with cardiomegaly, large hiatal hernia and left basilar atelectasis. Electronically Signed   By: Harmon PierJeffrey  Hu M.D.   On: 09/02/2017 11:18    Medications:  Scheduled:  Continuous: . dextrose 5 % and 0.9% NaCl 75 mL/hr at 09/02/17 2313  . pantoprozole (PROTONIX) infusion 8 mg/hr (09/04/17 0622)  . piperacillin-tazobactam (ZOSYN)  IV 3.375 g (09/04/17 0551)    Assessment/Plan: 1) Coffee-ground emesis. 2) Anemia - improved and stable. 3) ? Colitis. 4) Hypoxemia - 3 liters [place].   Previously it was documented that she was saturating at 100% on RA, but this was corrected after my discussion with nursing.  Clinically she has improved, but overall she is an ill patient.  It is likely that her coffee-ground emesis is associated with her large hiatal hernia.  Most likely there is an associated esophagitis.  As for her colitis, her WBC have markedly improved.  Since her respiratory status is poor and she is clinically improving overall, I do not recommend any further GI work up at this time.  Plan: 1) Continue with PPI. 2) Continue with Zosyn. 3) Signing off.  Call if there is any change that requires GI intervention.  LOS: 3 days   Jan Walters D 09/04/2017, 6:57 AM

## 2017-09-04 NOTE — Progress Notes (Signed)
Chart reviewed and patient assessment completed. No family at bedside during my visit. Nicole SimmondsGrandson, Nicole Key went to work and will not be back till this evening. [facility]30[facility].  NO CHARGE  Nicole HomansMegan Jkwon Treptow, FNP-C Palliative Medicine Team  Phone: [phone] Fax: [phone]

## 2017-09-04 NOTE — Progress Notes (Signed)
Nicole Key  ZOX:[id number] DOB: 08-01-1928 DOA: 09/01/2017 PCP: Geoffry Paradise, MD   Brief Narrative: Nicole Key is a 84 y.o. female with medical history significant for large hiatal hernia with history of incarcerated hiatal hernia with obstruction 2016, aspiration pneumonia hypertension, fibromyalgia, anxiety, GERD, malnutrition presents to the emergency Department chief complaint intractable nausea and vomiting hematemesis. Initial evaluation reveals elevated lactic acid hypokalemia, leukocytosis, tachycardia, acute kidney injury possible urinary tract infection. [facility] are asked to admit   Assessment & Plan:   Principal Problem:   Sepsis (HCC) Active Problems:   Meniere's disease   Anxiety   Essential hypertension   GERD (gastroesophageal reflux disease)   Hiatal hernia   AKI (acute kidney injury) (HCC)   GI bleed   Hematemesis   Melena   Hypokalemia   Leukocytosis   Elevated lactic acid level   Intractable nausea and vomiting   Tachycardia  1-Sepsis; PNA vs colitis.  Presents with leukocytosis , lactic acidosis.  WBC trending down.  Lactic acid decrease from 12---4.9 Continue with Zosyn.    GI bleed, hematemesis.  Hb drop from 11 to 9.4 on admission.  Transfused  one unit PRBC 1-06 and 1-07 Protonix gtt.  Hb stable yesterday at 10. Await labs for the morning.   Hypomagnesemia;  Received IV mg  Repeat levels.   Acute encephalopathy;  She is lethargic, only moan.  Suspect related to acute illness.  She is more alert today, overall very weak. .   Colitis;  C diff negative.  GI pathogen negative.  diarrhea, bloody stool.  Support care, IV fluids antibiotics,   Hypokalemia;  Labs pending for the morning.   Acute hypoxic respiratory failure; in setting PNA. Aspiration.  Chest x ray, atelectasis vs infection.  Continue with zosyn.  Incentive spirometry  Speech evaluation.  Will decreased IV fluids.   AKI; in  setting of sepsis, hypovolemia.  Cr peak to 1.5. Improved with IV fluids.    HTN; tachycardia;  IV Metoprolol PRN.   Fibromyalgia. Appears stable at baseline.     DVT prophylaxis: SCD Code Status: Partial code.  Family Communication: grandson at bedside.  Disposition Plan: Remain in the step down unit.   Consultants:   GI   Palliative care consult    Procedures:   none   Antimicrobials:  Zosyn 09-01-2017   Subjective: She is more alert today, open eyes to voice, answer few question. Speech soft. She is complaining of abdominal pain, leg pain.  No further BM reported.   Objective: Vitals:   09/03/17 2026 09/04/17 0000 09/04/17 0400 09/04/17 0800  BP: (!) 161/70 (!) 146/62  (!) 167/67  Pulse:    70  Resp: (!) 23 17  15   Temp: 98.1 F (36.7 C) 98.1 F (36.7 C) 98.2 F (36.8 C) 98 F (36.7 C)  TempSrc: Oral Axillary Oral Oral  SpO2: 100% 100%  100%  Weight:      Height:        Intake/Output Summary (Last 24 hours) at 09/04/2017 0900 Last data filed at 09/04/2017 0805 Gross per 24 hour  Intake 2615 ml  Output 500 ml  Net 2115 ml   Filed Weights   09/01/17 0828 09/02/17 0334 09/03/17 0427  Weight: 44.5 kg (98 lb) 44 kg (97 lb 0 oz) 43.2 kg (95 lb 3.8 oz)    Examination:  General exam: more alert, open eyes to voice. Ill appearing.  Respiratory system ; tachypnea. Bilateral ronchus.  Cardiovascular system:  S 1, S 2 RRR Gastrointestinal system: BS present , soft, mild tender.  Central nervous system: alert, following commands.  Extremities: no edema    Data Reviewed: I have personally reviewed following labs and imaging studies  CBC: Recent Labs  Lab 09/01/17 0814 09/01/17 1116 09/01/17 1545 09/01/17 2150 09/02/17 0224  09/02/17 2134 09/03/17 0258 09/03/17 1407 09/03/17 2119 09/04/17 0316  WBC 42.2*  --  32.5* 34.6* 34.0*  --   --   --  16.2*  --   --   NEUTROABS  --  29.2*  --   --   --   --   --   --   --   --   --   HGB 11.9*  --   10.4* 9.7* 9.4*   < > 9.8* 7.9* 10.2* 10.0* 10.8*  HCT 38.4  --  32.4* 29.8* 29.6*   < > 29.9* 25.2* 32.6* 31.1* 34.4*  MCV 96.2  --  92.6 93.4 93.7  --   --   --  91.3  --   --   PLT 457*  --  381 344 329  --   --   --  250  --   --    < > = values in this interval not displayed.   Basic Metabolic Panel: Recent Labs  Lab 09/01/17 0814 09/02/17 0224 09/02/17 1526 09/03/17 1407  NA 141 141  --  145  K 2.9* 3.1*  --  3.1*  CL 93* 108  --  116*  CO2 21* 24  --  24  GLUCOSE 212* 132*  --  114*  BUN 38* 43*  --  14  CREATININE 1.56* 1.13*  --  0.53  CALCIUM 9.0 7.3*  --  7.4*  MG  --   --  1.4*  --    GFR: Estimated Creatinine Clearance: 33.8 mL/min (by C-G formula based on SCr of 0.53 mg/dL). Liver Function Tests: Recent Labs  Lab 09/01/17 0814  AST 47*  ALT 19  ALKPHOS 62  BILITOT 1.0  PROT 6.5  ALBUMIN 3.1*   Recent Labs  Lab 09/01/17 0814  LIPASE 27   No results for input(s): AMMONIA in the last 168 hours. Coagulation Profile: Recent Labs  Lab 09/01/17 0814 09/01/17 1807  INR 1.12 1.28   Cardiac Enzymes: No results for input(s): CKTOTAL, CKMB, CKMBINDEX, TROPONINI in the last 168 hours. BNP (last 3 results) No results for input(s): PROBNP in the last 8760 hours. HbA1C: No results for input(s): HGBA1C in the last 72 hours. CBG: Recent Labs  Lab 09/03/17 1136 09/03/17 1620 09/03/17 2024 09/04/17 0030 09/04/17 0454  GLUCAP 109* 110* 112* 106* 110*   Lipid Profile: No results for input(s): CHOL, HDL, LDLCALC, TRIG, CHOLHDL, LDLDIRECT in the last 72 hours. Thyroid Function Tests: No results for input(s): TSH, T4TOTAL, FREET4, T3FREE, THYROIDAB in the last 72 hours. Anemia Panel: No results for input(s): VITAMINB12, FOLATE, FERRITIN, TIBC, IRON, RETICCTPCT in the last 72 hours. Sepsis Labs: Recent Labs  Lab 09/01/17 0841 09/01/17 1244 09/01/17 1251 09/01/17 1545  LATICACIDVEN 12.68* 4.7* 4.87* 4.9*    Recent Results (from the past 240 hour(s))   Gastrointestinal Panel by PCR , Stool     Status: None   Collection Time: 09/01/17  8:54 AM  Result Value Ref Range Status   Campylobacter species NOT DETECTED NOT DETECTED Final   Plesimonas shigelloides NOT DETECTED NOT DETECTED Final   Salmonella species NOT DETECTED NOT DETECTED Final  Yersinia enterocolitica NOT DETECTED NOT DETECTED Final   Vibrio species NOT DETECTED NOT DETECTED Final   Vibrio cholerae NOT DETECTED NOT DETECTED Final   Enteroaggregative E coli (EAEC) NOT DETECTED NOT DETECTED Final   Enteropathogenic E coli (EPEC) NOT DETECTED NOT DETECTED Final   Enterotoxigenic E coli (ETEC) NOT DETECTED NOT DETECTED Final   Shiga like toxin producing E coli (STEC) NOT DETECTED NOT DETECTED Final   Shigella/Enteroinvasive E coli (EIEC) NOT DETECTED NOT DETECTED Final   Cryptosporidium NOT DETECTED NOT DETECTED Final   Cyclospora cayetanensis NOT DETECTED NOT DETECTED Final   Entamoeba histolytica NOT DETECTED NOT DETECTED Final   Giardia lamblia NOT DETECTED NOT DETECTED Final   Adenovirus F40/41 NOT DETECTED NOT DETECTED Final   Astrovirus NOT DETECTED NOT DETECTED Final   Norovirus GI/GII NOT DETECTED NOT DETECTED Final   Rotavirus A NOT DETECTED NOT DETECTED Final   Sapovirus (I, II, IV, and V) NOT DETECTED NOT DETECTED Final    Comment: Performed at [facility], [address] [place], [place] [address]  C difficile quick scan w PCR reflex     Status: None   Collection Time: 09/01/17  8:54 AM  Result Value Ref Range Status   C Diff antigen NEGATIVE NEGATIVE Final   C Diff toxin NEGATIVE NEGATIVE Final   C Diff interpretation No C. difficile detected.  Final  Culture, blood (routine x 2)     Status: None (Preliminary result)   Collection Time: 09/01/17  9:06 AM  Result Value Ref Range Status   Specimen Description BLOOD RIGHT FOREARM  Final   Special Requests   Final    BOTTLES DRAWN AEROBIC AND ANAEROBIC Blood Culture results may not be optimal due to  an inadequate volume of blood received in culture bottles   Culture NO GROWTH 2 DAYS  Final   Report Status PENDING  Incomplete  Culture, blood (routine x 2)     Status: None (Preliminary result)   Collection Time: 09/01/17 11:16 AM  Result Value Ref Range Status   Specimen Description BLOOD RIGHT ANTECUBITAL  Final   Special Requests   Final    BOTTLES DRAWN AEROBIC AND ANAEROBIC Blood Culture results may not be optimal due to an inadequate volume of blood received in culture bottles   Culture NO GROWTH 2 DAYS  Final   Report Status PENDING  Incomplete         Radiology Studies: Dg Chest Port 1 View  Result Date: 09/02/2017 CLINICAL DATA:  Cough and chest pain EXAM: PORTABLE CHEST 1 VIEW COMPARISON:  09/01/2017 and prior exams FINDINGS: Cardiomegaly, large hiatal hernia and left basilar atelectasis again noted. No pneumothorax or significant changes are noted since the prior study. No acute bony abnormalities are present. IMPRESSION: Unchanged appearance the chest with cardiomegaly, large hiatal hernia and left basilar atelectasis. Electronically Signed   By: Harmon PierJeffrey  Hu M.D.   On: 09/02/2017 11:18        Scheduled Meds:  Continuous Infusions: . dextrose 5 % and 0.9% NaCl 75 mL/hr at 09/02/17 2313  . piperacillin-tazobactam (ZOSYN)  IV 3.375 g (09/04/17 0551)     LOS: 3 days    Time spent: 35 minutes.     Alba CoryBelkys A Janeen Watson, MD [facility] Pager [phone]  If 7PM-7AM, please contact night-coverage [email or web address] Password [email or web address] 09/04/2017, 9:00 AM

## 2017-09-04 NOTE — Progress Notes (Signed)
Initial Nutrition Assessment  DOCUMENTATION CODES:   Severe malnutrition in context of chronic illness, Underweight  INTERVENTION:    Await swallow evaluation results/SLP recommendations  RD to follow for nutrition care plan  NUTRITION DIAGNOSIS:   Severe Malnutrition related to chronic illness(recurrent hiatal hernia, anxiety, fibromyalgia ) as evidenced by severe fat depletion, severe muscle depletion  GOAL:   Patient will meet greater than or equal to 90% of their needs  MONITOR:   Diet advancement, PO intake, Labs, Skin, Weight trends  REASON FOR ASSESSMENT:   Low Braden  ASSESSMENT:   84 y.o. Female with PMH significant for large hiatal hernia with history of incarcerated hiatal hernia with obstruction 2016, aspiration pneumonia hypertension, fibromyalgia, anxiety, GERD, malnutrition presents to the emergency Department chief complaint intractable nausea and vomiting hematemesis.  Pt sleeping soundly upon RD visit. No family at bedside. Hx of severe malnutrition which is ongoing. Noted pt is from home.  GI notes reviewed. Pt with large hiatal hernia. ? sepsis, ischemic colitis. Endoscopy not possible at this time due to multiple comorbidities.  Palliative Medicine Team consulted for GOC.  Per readings below, she's had 8% weight loss x 6 months. Not significant for time frame. Labs and medications reviewed. CBG's [id number]. Speech Path consulted for swallow evaluation.  NUTRITION - FOCUSED PHYSICAL EXAM:    Most Recent Value  Orbital Region  Severe depletion  Upper Arm Region  Severe depletion  Thoracic and Lumbar Region  Unable to assess  Buccal Region  Severe depletion  Temple Region  Severe depletion  Clavicle Bone Region  Severe depletion  Clavicle and Acromion Bone Region  Severe depletion  Scapular Bone Region  Unable to assess  Dorsal Hand  Unable to assess  Patellar Region  Severe depletion  Anterior Thigh Region  Severe depletion  Posterior  Calf Region  Severe depletion  Edema (RD Assessment)  None     Diet Order:  Diet NPO time specified Except for: Cit[employer]ce Chips  EDUCATION NEEDS:   No education needs have been identified at this time  Skin:  Skin Assessment: Reviewed RN Assessment  Last BM:  1/6  Height:   Ht Readings from Last 1 Encounters:  09/01/17 5\' 8"  (1.727 m)   Weight:   Wt Readings from Last 1 Encounters:  09/03/17 95 lb 3.8 oz (43.2 kg)   Wt Readings from Last 10 Encounters:  09/03/17 95 lb 3.8 oz (43.2 kg)  03/09/17 103 lb 12.8 oz (47.1 kg)  10/19/16 99 lb 9.6 oz (45.2 kg)  02/04/15 121 lb 4.1 oz (55 kg)  08/15/13 117 lb (53.1 kg)  07/18/11 120 lb (54.4 kg)   Ideal Body Weight:  63.6 kg  BMI:  Body mass index is 14.48 kg/m.  Estimated Nutritional Needs:   Kcal:  1300-1500  Protein:  60-75 gm  Fluid:  >/= 1.5 L  Maureen ChattersKatie Talbot Monarch, RD, LDN Pager #: [phone] After-Hours Pager #: [phone]

## 2017-09-05 LAB — BASIC METABOLIC PANEL
Anion gap: 7 (ref 5–15)
CALCIUM: 7.4 mg/dL — AB (ref 8.9–10.3)
CHLORIDE: 107 mmol/L (ref 101–111)
CO2: 26 mmol/L (ref 22–32)
CREATININE: 0.46 mg/dL (ref 0.44–1.00)
GFR calc non Af Amer: >60 mL/min (ref >60)
Glucose, Bld: 98 mg/dL (ref 65–99)
Potassium: 2.6 mmol/L — CL (ref 3.5–5.1)
Sodium: 140 mmol/L (ref 135–145)

## 2017-09-05 LAB — CBC
HEMATOCRIT: 37.1 % (ref 36.0–46.0)
HEMOGLOBIN: 11.5 g/dL — AB (ref 12.0–15.0)
MCH: 28.4 pg (ref 26.0–34.0)
MCHC: 31 g/dL (ref 30.0–36.0)
MCV: 91.6 fL (ref 78.0–100.0)
Platelets: 326 10*3/uL (ref 150–400)
RBC: 4.05 MIL/uL (ref 3.87–5.11)
RDW: 17 % — AB (ref 11.5–15.5)
WBC: 12.9 10*3/uL — ABNORMAL HIGH (ref 4.0–10.5)

## 2017-09-05 LAB — GLUCOSE, CAPILLARY
GLUCOSE-CAPILLARY: 100 mg/dL — AB (ref 65–99)
GLUCOSE-CAPILLARY: 109 mg/dL — AB (ref 65–99)
Glucose-Capillary: 91 mg/dL (ref 65–99)
Glucose-Capillary: 130 mg/dL — ABNORMAL HIGH (ref 65–99)
Glucose-Capillary: 113 mg/dL — ABNORMAL HIGH (ref 65–99)

## 2017-09-05 MED ORDER — POTASSIUM CHLORIDE 10 MEQ/100ML IV SOLN
INTRAVENOUS | Status: AC
  Administered 2017-09-05: 10 meq via INTRAVENOUS
  Filled 2017-09-05: qty 100

## 2017-09-05 MED ORDER — NON FORMULARY: 5.0000 mg | Freq: Every evening | Status: DC | PRN

## 2017-09-05 MED ORDER — HYDRALAZINE HCL 20 MG/ML IJ SOLN
10.0000 mg | Freq: Three times a day (TID) | INTRAMUSCULAR | Status: DC | PRN
  Administered 2017-09-10 – 2017-09-11 (×2): 10 mg via INTRAVENOUS
  Filled 2017-09-05 (×2): qty 1

## 2017-09-05 MED ORDER — DILTIAZEM HCL 30 MG PO TABS
30.0000 mg | ORAL_TABLET | Freq: Three times a day (TID) | ORAL | Status: DC
  Administered 2017-09-05 – 2017-09-14 (×28): 30 mg via ORAL
  Filled 2017-09-05 (×28): qty 1

## 2017-09-05 MED ORDER — POTASSIUM CHLORIDE 10 MEQ/100ML IV SOLN
10.0000 meq | INTRAVENOUS | Status: AC
  Administered 2017-09-05 (×4): 10 meq via INTRAVENOUS
  Filled 2017-09-05 (×2): qty 100

## 2017-09-05 MED ORDER — POTASSIUM CHLORIDE 20 MEQ PO PACK
20.0000 meq | PACK | Freq: Two times a day (BID) | ORAL | Status: AC
  Administered 2017-09-05 (×2): 20 meq via ORAL
  Filled 2017-09-05 (×2): qty 1

## 2017-09-05 MED ORDER — MELATONIN 3 MG PO TABS
4.5000 mg | ORAL_TABLET | Freq: Every evening | ORAL | Status: DC | PRN
  Administered 2017-09-06 – 2017-09-13 (×6): 4.5 mg via ORAL
  Filled 2017-09-05 (×8): qty 1.5

## 2017-09-05 MED ORDER — POTASSIUM CHLORIDE 10 MEQ/100ML IV SOLN
INTRAVENOUS | Status: AC
  Filled 2017-09-05: qty 100

## 2017-09-05 NOTE — Progress Notes (Signed)
CRITICAL VALUE ALERT  Critical Value:  Potassium 2.6   Date & Time Notied:  09/05/16 at 10:45 am  Provider Notified: Regalado  Orders Received/Actions taken: new orders placed by MD

## 2017-09-05 NOTE — Evaluation (Signed)
Clinical/Bedside Swallow Evaluation Patient Details  Name: Nicole Key MRN: [id number] Date of Birth: 1928/08/06  Today's Date: 09/05/2017 Time: SLP Start Time (ACUTE ONLY): 0959 SLP Stop Time (ACUTE ONLY): 1017 SLP Time Calculation (min) (ACUTE ONLY): 18 min  Past Medical History:  Past Medical History:  Diagnosis Date  . Anxiety   . Arthritis   . Aspiration pneumonia (HCC)   . Fibromyalgia   . GERD (gastroesophageal reflux disease)   . Hypertension   . Incarcerated hiatal hernia with obstruction 02/07/2015  . Insomnia   . Meniere's disease 1989  . Syncope 08/15/2013  . Vertigo    Past Surgical History:  Past Surgical History:  Procedure Laterality Date  . ABDOMINAL HYSTERECTOMY    . APPENDECTOMY    . BILATERAL TOTAL MASTECTOMY WITH AXILLARY LYMPH NODE DISSECTION  19872   HPI:  84 year old female with history of incarcerated hiatal hernia back in 2016, aspiration pneumonia, GERD, fibromyalgia, anxiety, colonoscopy showed esophageal stricture and hiatal hernia which was dilated came to the hospital with complaints of intractable nausea with hematemesis.Pt found to be in sinus tachycardia, leukocytosis, acute kidney injury and has sepsis secondary to colitis and UTI. Marland KitchenCT of the abdomen pelvis shows diffuse colitis, emphysematous cystitis, L3 compression fracture. CXR unchanged appearance the chest with cardiomegaly, large hiatal hernia and left basilar atelectasis. BSE 03/09/17 indication of primary esophageal dysphagia, no overt s/s aspiration, regular texture, thin liquids rec'd.    Assessment / Plan / Recommendation Clinical Impression  Pt continues with mild nausea and consumed enough to assess oropharyngeal swallow function. No s/s pharyngeal dysphagia or compromised airway protection exhibited. History of remote esophageal dilation and has large hiatal hernia. Reviewed/re-educated esophageal precautions to mitigate impairments. Pt chose to initiate regular texture versus  liquids to order what she can tolerate. No further ST needed.    SLP Visit Diagnosis: Dysphagia, unspecified (R13.10)    Aspiration Risk  Mild aspiration risk    Diet Recommendation Regular;Thin liquid   Liquid Administration via: Cup;Straw Medication Administration: Whole meds with liquid Supervision: Patient able to self feed Compensations: Slow rate;Small sips/bites Postural Changes: Remain upright for at least 30 minutes after po intake;Seated upright at 90 degrees    Other  Recommendations Oral Care Recommendations: Oral care BID   Follow up Recommendations None      Frequency and Duration            Prognosis        Swallow Study   General HPI: 84 year old female with history of incarcerated hiatal hernia back in 2016, aspiration pneumonia, GERD, fibromyalgia, anxiety, colonoscopy showed esophageal stricture and hiatal hernia which was dilated came to the hospital with complaints of intractable nausea with hematemesis.Pt found to be in sinus tachycardia, leukocytosis, acute kidney injury and has sepsis secondary to colitis and UTI. Marland KitchenCT of the abdomen pelvis shows diffuse colitis, emphysematous cystitis, L3 compression fracture. CXR unchanged appearance the chest with cardiomegaly, large hiatal hernia and left basilar atelectasis. BSE 03/09/17 indication of primary esophageal dysphagia, no overt s/s aspiration, regular texture, thin liquids rec'd.  Type of Study: Bedside Swallow Evaluation Previous Swallow Assessment: (see HPI) Diet Prior to this Study: NPO Temperature Spikes Noted: No Respiratory Status: Nasal cannula History of Recent Intubation: No Behavior/Cognition: Alert;Cooperative Oral Cavity Assessment: Within Functional Limits Oral Care Completed by SLP: No Oral Cavity - Dentition: Dentures, top;Dentures, bottom Vision: Functional for self-feeding Self-Feeding Abilities: Able to feed self Patient Positioning: Upright in bed Baseline Vocal Quality: Low vocal  intensity Volitional Cough:  Strong Volitional Swallow: Able to elicit    Oral/Motor/Sensory Function Overall Oral Motor/Sensory Function: Mild impairment Facial ROM: Reduced left(stroke "many years ago") Lingual ROM: Within Functional Limits Lingual Symmetry: Within Functional Limits Lingual Strength: Within Functional Limits   Ice Chips Ice chips: Not tested   Thin Liquid Thin Liquid: Within functional limits Presentation: Cup;Straw    Nectar Thick Nectar Thick Liquid: Not tested   Honey Thick Honey Thick Liquid: Not tested   Puree Puree: Within functional limits   Solid   GO   Solid: Within functional limits        Roque CashLitaker, Breck CoonsLisa Willis 09/05/2017,10:31 AM  Breck CoonsLisa Willis Lonell FaceLitaker M.Ed [employer]-SLP Pager [phone]

## 2017-09-05 NOTE — Care Management Important Message (Signed)
Important Message  Patient Details  Name: Nicole Key MRN: [id number] Date of Birth: 06/25/1928   Medicare Important Message Given:  Yes    [name] Abena 09/05/2017, 11:16 AM

## 2017-09-05 NOTE — Consult Note (Signed)
Consultation Note Date: 09/06/17  Patient Name: Nicole Key  DOB: Dec 14, 1927  MRN: [id number]  Age / Sex: 84 y.o., female  PCP: Burnard Bunting, MD Referring Physician: Bonnell Public, MD  Reason for Consultation: Establishing goals of care  HPI/Patient Profile: 84 y.o. female  with past medical history of incarcerated hiatal hernia with obstruction in 2016, Meniere's disease, syncope, HTN, GERD, fibromyalgia, aspiration pneumonia, arthritis, anxiety, and malnutrition admitted on 09/01/2017 with nausea, vomiting, abdominal pain, and dark stools. In ED, patient with elevated lactic acid, hypokalemia, leukocytosis, tachycardia, and acute kidney injury. Sepsis and acute respiratory failure secondary to possible pneumonia and colitis. Receiving IV antibiotics and IVF. GI evaluated but patient was not stable for EGD. Continue with supportive care. HgB stable and GI has signed off. PT/OT/SLP consulted. Palliative medicine consultation for goals of care.   Clinical Assessment and Goals of Care: I have reviewed medical records, discussed with care team, and met with patient and grandson Nicole Key) at bedside to discuss diagnosis, GOC, EOL wishes, disposition and options. Nicole Key is awake, alert, oriented, and able to participate in the conversation.   Introduced Palliative Medicine as specialized medical care for people living with serious illness. It focuses on providing relief from the symptoms and stress of a serious illness. The goal is to improve quality of life for both the patient and the family.  We discussed a brief life review of the patient. Widowed. Lives home with Nicole Key, who is very supportive and ensures she has everything she needs. He has hired caregivers 3x a week. Nicole Key is weak but able to ambulate with walker and complete ADL's with minimal or no assist (depending on the day). Patient's appetite  has been poor for many months due to chronic nausea. She eats small meals.   Discussed hospital diagnoses, interventions, and underlying co-morbidities. The patient has shown improvement throughout course of hospitalization. She has passed a speech swallow eval and cleared for Regular diet. Physical therapy will be working with her today. She tells me she is "tired" and does not think she should have to go through intense physical therapy due to her age.  I attempted to elicit values and goals of care important to the patient and her grandson. Being home is most important. Nicole Key is willing to do anything possible to keep her home and as independent as possible, including hiring caregivers more frequently or cutting back on work hours. The patient and Nicole Dibbles do not want rehab after hospitalization. She will go "nowhere but home."  Advanced directives, concepts specific to code status, artifical feeding and hydration, and rehospitalization were considered and discussed. Nicole Key and the patient believe they completed AD packet during hospitalization in 2016. Not on file. I encouraged Nicole Key to complete AD packet again in order to have [place] as documented HCPOA if she was unable to make decisions for herself. [name] shakes her head 'no' when I mention life prolonging measures such as resuscitation/life support/feeding tube. Introduced MOST form and encouraged them to complete with  any healthcare provider in the future.   Palliative Care services outpatient were explained and offered. Explained this can transition to hospice services when they are ready and if goal is focus on comfort and preventing re-hospitalization.   Questions and concerns were addressed. PMT contact information given.     SUMMARY OF RECOMMENDATIONS    Limited code. Continue medical management.   PT to work with patient today. She will likely decline SNF for rehab if recommended.   Nicole Key is primary caregiver and very support. He  will ensure she has everything she needs in order to keep her home.   Introduced MOST form and encouraged them to complete with any provider.   Chaplain consult placed for AD packet.   May benefit from continued outpatient palliative support.   Code Status/Advance Care Planning:  Limited code-no CPR, intubation or mechanical ventilation  Symptom Management:   Per attending  Palliative Prophylaxis:   Aspiration, Delirium Protocol, Frequent Pain Assessment, Oral Care and Turn Reposition  Additional Recommendations (Limitations, Scope, Preferences):  Full Scope Treatment  Psycho-social/Spiritual:   Desire for further Chaplaincy support: yes  Additional Recommendations: Caregiving  Support/Resources and Education on Hospice  Prognosis:   Unable to determine  Discharge Planning: To Be Determined      Primary Diagnoses: Present on Admission: . GI bleed . Hematemesis . Melena . Meniere's disease . Essential hypertension . GERD (gastroesophageal reflux disease) . Anxiety . AKI (acute kidney injury) ([place]) . Hypokalemia . Leukocytosis . Elevated lactic acid level . Intractable nausea and vomiting . Tachycardia . Sepsis ([place])   I have reviewed the medical record, interviewed the patient and family, and examined the patient. The following aspects are pertinent.  Past Medical History:  Diagnosis Date  . Anxiety   . Arthritis   . Aspiration pneumonia ([place])   . Fibromyalgia   . GERD (gastroesophageal reflux disease)   . Hypertension   . Incarcerated hiatal hernia with obstruction 02/07/2015  . Insomnia   . Meniere's disease 1989  . Syncope 08/15/2013  . Vertigo    Social History   Socioeconomic History  . Marital status: Widowed    Spouse name: None  . Number of children: None  . Years of education: None  . Highest education level: None  Social Needs  . Financial resource strain: None  . Food insecurity - worry: None  . Food insecurity - inability:  None  . Transportation needs - medical: None  . Transportation needs - non-medical: None  Occupational History  . None  Tobacco Use  . Smoking status: Never Smoker  . Smokeless tobacco: Never Used  Substance and Sexual Activity  . Alcohol use: No    Comment: denies alcohol use  . Drug use: No  . Sexual activity: No  Other Topics Concern  . None  Social History Narrative  . None   Family History  Problem Relation Age of Onset  . Aortic aneurysm Mother        Had ruptured aorta  . Lung cancer Father   . Heart disease Father    Scheduled Meds: . diltiazem  30 mg Oral TID  . pantoprazole (PROTONIX) IV  40 mg Intravenous Q12H   Continuous Infusions: . dextrose 5 % and 0.9% NaCl 50 mL/hr at 09/06/17 1036  . piperacillin-tazobactam (ZOSYN)  IV 3.375 g (09/06/17 1040)   PRN Meds:.acetaminophen **OR** acetaminophen, albuterol, hydrALAZINE, Melatonin, morphine injection Medications Prior to Admission:  Prior to Admission medications   Medication Sig Start Date End  Date Taking? Authorizing Provider  promethazine (PHENERGAN) 25 MG tablet Take 25 mg by mouth every 8 (eight) hours as needed for nausea/vomiting. 07/03/17  Yes [provider]  acetaminophen (TYLENOL) 325 MG tablet Take 325-650 mg by mouth every 6 (six) hours as needed for mild pain.    [provider]  ALPRAZolam Duanne Moron) 0.25 MG tablet Take 1 tablet (0.25 mg total) by mouth at bedtime as needed (anxiety). Patient taking differently: Take 0.25 mg by mouth 2 (two) times daily as needed for anxiety.  10/19/16   Geradine Girt, DO  ASPERCREME LIDOCAINE EX Apply 1 application topically 3 (three) times daily.    [provider]  Cyanocobalamin (VITAMIN B 12 PO) Take 1 tablet by mouth daily.    [provider]  diltiazem (CARDIZEM) 30 MG tablet Take 1 tablet (30 mg total) by mouth 3 (three) times daily. 03/10/17 03/10/18  Patrecia Pour, MD  HYDROcodone-acetaminophen (NORCO/VICODIN) 5-325 MG  tablet Take 1 tablet by mouth every 4 (four) hours as needed for moderate pain.    [provider]  hyoscyamine (LEVSIN, ANASPAZ) 0.125 MG tablet Take 0.125 mg by mouth every 4 (four) hours as needed for bladder spasms or cramping.    [provider]  meclizine (ANTIVERT) 25 MG tablet Take 25 mg by mouth 3 (three) times daily as needed for dizziness. 08/25/16   [provider]  metoprolol tartrate (LOPRESSOR) 25 MG tablet Take 1 tablet (25 mg total) by mouth 2 (two) times daily. 10/19/16   Geradine Girt, DO  pantoprazole (PROTONIX) 40 MG tablet Take 1 tablet (40 mg total) by mouth 2 (two) times daily before a meal. 02/09/15   Donne Hazel, MD  traMADol (ULTRAM) 50 MG tablet Take 50 mg by mouth every 6 (six) hours as needed for pain. 07/03/17   [provider]  traZODone (DESYREL) 50 MG tablet Take 1 tablet (50 mg total) by mouth at bedtime as needed for sleep. 02/09/15   Donne Hazel, MD   Allergies  Allergen Reactions  . Shellfish Allergy Hives   Review of Systems  Constitutional: Positive for activity change and appetite change.  Neurological: Positive for weakness.   Physical Exam  Constitutional: She is oriented to person, place, and time.  HENT:  Head: Normocephalic and atraumatic.  Cardiovascular: Regular rhythm.  Pulmonary/Chest: Effort normal.  Neurological: She is alert and oriented to person, place, and time.  Skin: Skin is warm and dry. There is pallor.  Psychiatric: She has a normal mood and affect. Her speech is normal and behavior is normal. Cognition and memory are normal.  Nursing note and vitals reviewed.  Vital Signs: BP (!) 158/77 (BP Location: Left Arm)   Pulse 85   Temp 98.2 F (36.8 C) (Oral)   Resp 19   Ht '5\' 8"'  (1.727 m)   Wt 43.4 kg (95 lb 10.9 oz)   SpO2 98%   BMI 14.55 kg/m  Pain Assessment: 0-10   Pain Score: 6   SpO2: SpO2: 98 % O2 Device:SpO2: 98 % O2 Flow Rate: .O2 Flow Rate (L/min): 4 L/min  IO:  Intake/output summary:   Intake/Output Summary (Last 24 hours) at 09/06/2017 1149 Last data filed at 09/06/2017 0700 Gross per 24 hour  Intake 925 ml  Output 700 ml  Net 225 ml    LBM: Last BM Date: 09/04/17 Baseline Weight: Weight: 44.5 kg (98 lb) Most recent weight: Weight: 43.4 kg (95 lb 10.9 oz)  Palliative Assessment/Data: PPS 40%   Flowsheet Rows     Most Recent Value  Intake Tab  Referral Department  Hospitalist  Unit at Time of Referral  Cardiac/Telemetry Unit  Palliative Care Primary Diagnosis  Sepsis/Infectious Disease  Date Notified  09/03/17  Palliative Care Type  New Palliative care  Reason for referral  Clarify Goals of Care  Date of Admission  09/01/17  Date first seen by Palliative Care  09/04/17  # of days Palliative referral response time  1 Day(s)  # of days IP prior to Palliative referral  2  Clinical Assessment  Palliative Performance Scale Score  40%  Psychosocial & Spiritual Assessment  Palliative Care Outcomes  Patient/Family meeting held?  Yes  Who was at the meeting?  patient and grandson Nicole Key)  Palliative Care Outcomes  Clarified goals of care, Provided psychosocial or spiritual support, ACP counseling assistance, Linked to palliative care logitudinal support      Time In/Out: 0800-0830, 1000-1040 Time Total: 52mn Greater than 50%  of this time was spent counseling and coordinating care related to the above assessment and plan.  Signed by:  MIhor Dow FNP-C Palliative Medicine Team  Phone: [phone]: [phone]  Please contact Palliative Medicine Team phone at [phone] questions and concerns.  For individual provider: See AShea Evans

## 2017-09-05 NOTE — Progress Notes (Signed)
PT Cancellation Note  Patient Details Name: Nicole BlonderDorothy L Coghill MRN: [id number] DOB: 04-12-1928   Cancelled Treatment:    Reason Eval/Treat Not Completed: Fatigue/lethargy limiting ability to participate. Pt declining PT evaluation this afternoon secondary to fatigue and not feeling well; educ and encouragement on importance of mobility. Planning to meet with palliative care at some point; pt wishes to stay on caseload. Will follow-up for PT evaluation tomorrow.  Ina HomesJaclyn Dinita Migliaccio, PT, DPT Acute Rehab Services  Pager: [phone]  Malachy ChamberJaclyn L Hendrixx Severin 09/05/2017, 2:10 PM

## 2017-09-05 NOTE — Progress Notes (Signed)
PROGRESS NOTE    KYNDAHL JABLON  ZOX:[id number] DOB: [birth year] DOA: 09/01/2017 PCP: Geoffry Paradise, MD   Brief Narrative: Nicole Key is a 84 y.o. female with medical history significant for large hiatal hernia with history of incarcerated hiatal hernia with obstruction 2016, aspiration pneumonia hypertension, fibromyalgia, anxiety, GERD, malnutrition presents to the emergency Department chief complaint intractable nausea and vomiting hematemesis. Initial evaluation reveals elevated lactic acid hypokalemia, leukocytosis, tachycardia, acute kidney injury possible urinary tract infection. [facility] are asked to admit  Patient admitted with sepsis secondary to PNA and colitis. She presented with lactic acid at 12. She was also having GI bleed. She was evaluated by GI who recommend support care.  Patient was treated with IV fluids, IV protonix, IV antibiotics. She received blood transfusion. hre labs and vitals have improved. She continue to be generally weak. Palliative was consulted for goals of care.   Assessment & Plan:   Principal Problem:   Sepsis (HCC) Active Problems:   Meniere's disease   Anxiety   Essential hypertension   GERD (gastroesophageal reflux disease)   Hiatal hernia   AKI (acute kidney injury) (HCC)   GI bleed   Hematemesis   Melena   Hypokalemia   Leukocytosis   Elevated lactic acid level   Intractable nausea and vomiting   Tachycardia  1-Sepsis; PNA vs colitis.  Presents with leukocytosis , lactic acidosis.  WBC trending down. 34---12 Lactic acid decrease from 12---4.9 Continue with Zosyn.  Improved.   GI bleed, hematemesis.  Hb drop from 11 to 9.4 on admission.  Transfused  one unit PRBC 1-06 and 1-07 Protonix gtt.  Hemoglobin has been stable.  Started on diet   Hypomagnesemia;  Received IV mg  Repeat levels.   Acute Encephalopathy;  She is lethargic, only moan.  Suspect related to acute illness.  She is more alert  today  Colitis;  C diff negative.  GI pathogen negative.  diarrhea, bloody stool.  Support care, IV fluids antibiotics,  Start diet today   Hypokalemia;  Replete IV and orally.    Acute hypoxic respiratory failure; in setting PNA. Aspiration.  Chest x ray, atelectasis vs infection.  Continue with zosyn.  Incentive spirometry  Speech evaluation.    AKI; in setting of sepsis, hypovolemia.  Cr peak to 1.5. Improved with IV fluids.    HTN; tachycardia;  IV Metoprolol PRN.  BP increasing, resume Cardizem.  PRN Hydralazine.   Fibromyalgia. Appears stable at baseline.     DVT prophylaxis: SCD Code Status: Partial code.  Family Communication: grandson at bedside.  Disposition Plan: Remain in the step down unit.   Consultants:   GI   Palliative care consult    Procedures:   none   Antimicrobials:  Zosyn 09-01-2017   Subjective: She is alert, she appears weak. She report abdominal pain has improved.   Objective: Vitals:   09/04/17 2000 09/05/17 0400 09/05/17 0800 09/05/17 1141  BP: (!) 176/69 (!) 165/80 (!) 170/76 (!) 182/92  Pulse:   77 97  Resp: (!) 24 17 16 18   Temp: 99 F (37.2 C) 97.6 F (36.4 C) 98.2 F (36.8 C) 98.6 F (37 C)  TempSrc: Oral Oral Oral Oral  SpO2: 100% 98% 100% 99%  Weight:      Height:        Intake/Output Summary (Last 24 hours) at 09/05/2017 1531 Last data filed at 09/05/2017 1100 Gross per 24 hour  Intake 735 ml  Output 500 ml  Net 235  ml   Filed Weights   09/01/17 0828 09/02/17 0334 09/03/17 0427  Weight: 44.5 kg (98 lb) 44 kg (97 lb 0 oz) 43.2 kg (95 lb 3.8 oz)    Examination:  General exam: alert, thin appearing  Respiratory system ; Bilateral ronchus.  Cardiovascular system:  S 1, S 2 RRR Gastrointestinal system: BS present, soft, mild tenderness.  Central nervous system: alert, answer questions/  Extremities: no edema    Data Reviewed: I have personally reviewed following labs and imaging  studies  CBC: Recent Labs  Lab 09/01/17 1116  09/01/17 2150 09/02/17 0224  09/03/17 1407 09/03/17 2119 09/04/17 0316 09/04/17 0808 09/05/17 0907  WBC  --    < > 34.6* 34.0*  --  16.2*  --   --  13.3* 12.9*  NEUTROABS 29.2*  --   --   --   --   --   --   --   --   --   HGB  --    < > 9.7* 9.4*   < > 10.2* 10.0* 10.8* 10.9* 11.5*  HCT  --    < > 29.8* 29.6*   < > 32.6* 31.1* 34.4* 33.7* 37.1  MCV  --    < > 93.4 93.7  --  91.3  --   --  93.9 91.6  PLT  --    < > 344 329  --  250  --   --  275 326   < > = values in this interval not displayed.   Basic Metabolic Panel: Recent Labs  Lab 09/01/17 0814 09/02/17 0224 09/02/17 1526 09/03/17 1407 09/04/17 0808 09/05/17 0907  NA 141 141  --  145 144 140  K 2.9* 3.1*  --  3.1* 3.5 2.6*  CL 93* 108  --  116* 114* 107  CO2 21* 24  --  24 23 26   GLUCOSE 212* 132*  --  114* 99 98  BUN 38* 43*  --  14 10 <5*  CREATININE 1.56* 1.13*  --  0.53 0.49 0.46  CALCIUM 9.0 7.3*  --  7.4* 7.4* 7.4*  MG  --   --  1.4*  --  2.0  --    GFR: Estimated Creatinine Clearance: 33.8 mL/min (by C-G formula based on SCr of 0.46 mg/dL). Liver Function Tests: Recent Labs  Lab 09/01/17 0814  AST 47*  ALT 19  ALKPHOS 62  BILITOT 1.0  PROT 6.5  ALBUMIN 3.1*   Recent Labs  Lab 09/01/17 0814  LIPASE 27   No results for input(s): AMMONIA in the last 168 hours. Coagulation Profile: Recent Labs  Lab 09/01/17 0814 09/01/17 1807  INR 1.12 1.28   Cardiac Enzymes: No results for input(s): CKTOTAL, CKMB, CKMBINDEX, TROPONINI in the last 168 hours. BNP (last 3 results) No results for input(s): PROBNP in the last 8760 hours. HbA1C: No results for input(s): HGBA1C in the last 72 hours. CBG: Recent Labs  Lab 09/04/17 2014 09/04/17 2351 09/05/17 0354 09/05/17 0828 09/05/17 1121  GLUCAP 96 93 100* 91 109*   Lipid Profile: No results for input(s): CHOL, HDL, LDLCALC, TRIG, CHOLHDL, LDLDIRECT in the last 72 hours. Thyroid Function Tests: No  results for input(s): TSH, T4TOTAL, FREET4, T3FREE, THYROIDAB in the last 72 hours. Anemia Panel: No results for input(s): VITAMINB12, FOLATE, FERRITIN, TIBC, IRON, RETICCTPCT in the last 72 hours. Sepsis Labs: Recent Labs  Lab 09/01/17 0841 09/01/17 1244 09/01/17 1251 09/01/17 1545  LATICACIDVEN 12.68* 4.7* 4.87* 4.9*  Recent Results (from the past 240 hour(s))  Gastrointestinal Panel by PCR , Stool     Status: None   Collection Time: 09/01/17  8:54 AM  Result Value Ref Range Status   Campylobacter species NOT DETECTED NOT DETECTED Final   Plesimonas shigelloides NOT DETECTED NOT DETECTED Final   Salmonella species NOT DETECTED NOT DETECTED Final   Yersinia enterocolitica NOT DETECTED NOT DETECTED Final   Vibrio species NOT DETECTED NOT DETECTED Final   Vibrio cholerae NOT DETECTED NOT DETECTED Final   Enteroaggregative E coli (EAEC) NOT DETECTED NOT DETECTED Final   Enteropathogenic E coli (EPEC) NOT DETECTED NOT DETECTED Final   Enterotoxigenic E coli (ETEC) NOT DETECTED NOT DETECTED Final   Shiga like toxin producing E coli (STEC) NOT DETECTED NOT DETECTED Final   Shigella/Enteroinvasive E coli (EIEC) NOT DETECTED NOT DETECTED Final   Cryptosporidium NOT DETECTED NOT DETECTED Final   Cyclospora cayetanensis NOT DETECTED NOT DETECTED Final   Entamoeba histolytica NOT DETECTED NOT DETECTED Final   Giardia lamblia NOT DETECTED NOT DETECTED Final   Adenovirus F40/41 NOT DETECTED NOT DETECTED Final   Astrovirus NOT DETECTED NOT DETECTED Final   Norovirus GI/GII NOT DETECTED NOT DETECTED Final   Rotavirus A NOT DETECTED NOT DETECTED Final   Sapovirus (I, II, IV, and V) NOT DETECTED NOT DETECTED Final    Comment: Performed at [facility], [address] [place], [place] [address]  C difficile quick scan w PCR reflex     Status: None   Collection Time: 09/01/17  8:54 AM  Result Value Ref Range Status   C Diff antigen NEGATIVE NEGATIVE Final   C Diff toxin NEGATIVE  NEGATIVE Final   C Diff interpretation No C. difficile detected.  Final  Culture, blood (routine x 2)     Status: None (Preliminary result)   Collection Time: 09/01/17  9:06 AM  Result Value Ref Range Status   Specimen Description BLOOD RIGHT FOREARM  Final   Special Requests   Final    BOTTLES DRAWN AEROBIC AND ANAEROBIC Blood Culture results may not be optimal due to an inadequate volume of blood received in culture bottles   Culture NO GROWTH 4 DAYS  Final   Report Status PENDING  Incomplete  Culture, blood (routine x 2)     Status: None (Preliminary result)   Collection Time: 09/01/17 11:16 AM  Result Value Ref Range Status   Specimen Description BLOOD RIGHT ANTECUBITAL  Final   Special Requests   Final    BOTTLES DRAWN AEROBIC AND ANAEROBIC Blood Culture results may not be optimal due to an inadequate volume of blood received in culture bottles   Culture NO GROWTH 4 DAYS  Final   Report Status PENDING  Incomplete         Radiology Studies: No results found.      Scheduled Meds: . diltiazem  30 mg Oral TID  . pantoprazole (PROTONIX) IV  40 mg Intravenous Q12H  . potassium chloride  20 mEq Oral BID   Continuous Infusions: . dextrose 5 % and 0.9% NaCl 50 mL/hr at 09/04/17 2133  . piperacillin-tazobactam (ZOSYN)  IV 3.375 g (09/05/17 0900)     LOS: 4 days    Time spent: 35 minutes.     Alba Cory, MD [facility] Pager [phone]  If 7PM-7AM, please contact night-coverage [email or web address] Password [email or web address] 09/05/2017, 3:31 PM

## 2017-09-06 DIAGNOSIS — Z7189 Other specified counseling: Secondary | ICD-10-CM

## 2017-09-06 DIAGNOSIS — Z515 Encounter for palliative care: Secondary | ICD-10-CM

## 2017-09-06 LAB — CULTURE, BLOOD (ROUTINE X 2)
CULTURE: NO GROWTH
Culture: NO GROWTH

## 2017-09-06 LAB — BASIC METABOLIC PANEL
Anion gap: 5 (ref 5–15)
CO2: 26 mmol/L (ref 22–32)
CREATININE: 0.44 mg/dL (ref 0.44–1.00)
Calcium: 7.5 mg/dL — ABNORMAL LOW (ref 8.9–10.3)
Chloride: 108 mmol/L (ref 101–111)
Glucose, Bld: 128 mg/dL — ABNORMAL HIGH (ref 65–99)
Potassium: 3.1 mmol/L — ABNORMAL LOW (ref 3.5–5.1)
SODIUM: 139 mmol/L (ref 135–145)

## 2017-09-06 LAB — CBC
HCT: 32.9 % — ABNORMAL LOW (ref 36.0–46.0)
Hemoglobin: 10.3 g/dL — ABNORMAL LOW (ref 12.0–15.0)
MCH: 28.6 pg (ref 26.0–34.0)
MCHC: 31.3 g/dL (ref 30.0–36.0)
MCV: 91.4 fL (ref 78.0–100.0)
PLATELETS: 324 10*3/uL (ref 150–400)
RBC: 3.6 MIL/uL — AB (ref 3.87–5.11)
RDW: 16.9 % — ABNORMAL HIGH (ref 11.5–15.5)
WBC: 14.1 10*3/uL — ABNORMAL HIGH (ref 4.0–10.5)

## 2017-09-06 LAB — MAGNESIUM: Magnesium: 1.5 mg/dL — ABNORMAL LOW (ref 1.7–2.4)

## 2017-09-06 LAB — GLUCOSE, CAPILLARY
GLUCOSE-CAPILLARY: 121 mg/dL — AB (ref 65–99)
GLUCOSE-CAPILLARY: 133 mg/dL — AB (ref 65–99)
Glucose-Capillary: 129 mg/dL — ABNORMAL HIGH (ref 65–99)
Glucose-Capillary: 127 mg/dL — ABNORMAL HIGH (ref 65–99)
Glucose-Capillary: 115 mg/dL — ABNORMAL HIGH (ref 65–99)

## 2017-09-06 MED ORDER — POTASSIUM CHLORIDE 20 MEQ PO PACK
40.0000 meq | PACK | Freq: Two times a day (BID) | ORAL | Status: AC
  Administered 2017-09-06 – 2017-09-08 (×4): 40 meq via ORAL
  Filled 2017-09-06 (×6): qty 2

## 2017-09-06 MED ORDER — DIPHENOXYLATE-ATROPINE 2.5-0.025 MG/5ML PO LIQD
5.0000 mL | Freq: Four times a day (QID) | ORAL | Status: AC | PRN
  Administered 2017-09-06 – 2017-09-08 (×4): 5 mL via ORAL
  Filled 2017-09-06 (×4): qty 5

## 2017-09-06 MED ORDER — POTASSIUM CHLORIDE CRYS ER 20 MEQ PO TBCR: 40.0000 meq | EXTENDED_RELEASE_TABLET | Freq: Two times a day (BID) | ORAL | Status: DC

## 2017-09-06 NOTE — Progress Notes (Signed)
Pharmacy Antibiotic Note  Nicole Key is a 84 y.o. female on day # 6 Zosyn for colitis vs aspiration pneumonia vs sepsis.  Afebrile, WBC 42.2>>13.3>14.1.  Creatinine 1.56>>0.44 since admitted.  Zosyn dose adjusted on 1/7 with improved renal function.    Plan:  Continue Zosyn 3.375 gm IV q8hrs (each over 4 hours)  Follow renal function, final culture data, antibiotic plans.  Height: 5\' 8"  (172.7 cm) Weight: 95 lb 10.9 oz (43.4 kg) IBW/kg (Calculated) : 63.9  Temp (24hrs), Avg:98.1 F (36.7 C), Min:97.9 F (36.6 C), Max:98.2 F (36.8 C)  Recent Labs  Lab 09/01/17 0841 09/01/17 1244 09/01/17 1251 09/01/17 1545  09/02/17 0224 09/03/17 1407 09/04/17 0808 09/05/17 0907 09/06/17 0344  WBC  --   --   --  32.5*   < > 34.0* 16.2* 13.3* 12.9* 14.1*  CREATININE  --   --   --   --   --  1.13* 0.53 0.49 0.46 0.44  LATICACIDVEN 12.68* 4.7* 4.87* 4.9*  --   --   --   --   --   --    < > = values in this interval not displayed.    Estimated Creatinine Clearance: 33.9 mL/min (by C-G formula based on SCr of 0.44 mg/dL).    Allergies  Allergen Reactions  . Shellfish Allergy Hives    Antimicrobials this admission:  Zosyn 1/5>>  Dose adjustments this admission:  1/7: Zosyn adjusted from 2.25 gm to 3.375 gm IV q8h with improved renal function  Microbiology results:  1/5 Blood x 2 - ng x 4 days to date  1/5 GI panel -  negative  1/5 C diff - negatve  Thank you for allowing pharmacy to be a part of this patient's care.  Dennie Fettersgan, Wyolene Weimann [place], [place] Pager: [phone] 09/06/2017 12:49 PM

## 2017-09-06 NOTE — Evaluation (Signed)
Physical Therapy Evaluation Patient Details Name: Nicole Key MRN: [id number] DOB: 17-May-1928 Today's Date: 09/06/2017   History of Present Illness  Pt adm with sepsis likely due to colitis or PNA. PMH - HTN, aspiration PNA, fibromyalgia, incarcerated hiatal hernia, anxiety, malnutrition  Clinical Impression  PT eval limited by incontinence of liquid stool that wasn't containable with any further mobility beyond rolling to be cleaned. Pt very weak and deconditioned and anticipate she will need assist with all mobility at discharge. Pt appears to have supportive family and on past admission preferred to return home with family instead of go to SNF. If family is able to provide needed assist at home expect she could return there with them. If family isn't able to provide this care then feel pt would need SNF.    Follow Up Recommendations Home health PT;Supervision/Assistance - 24 hour(If familiy can provide 24 hour physical assist)    Equipment Recommendations  None recommended by PT    Recommendations for Other Services       Precautions / Restrictions Precautions Precautions: Fall Restrictions Weight Bearing Restrictions: No      Mobility  Bed Mobility Overal bed mobility: Needs Assistance Bed Mobility: Rolling Rolling: Mod assist         General bed mobility comments: Assist with hips and shoulders  Transfers                 General transfer comment: Not attempted due to continued uncontrolled BM   Ambulation/Gait             General Gait Details: Not attempted due to continued uncontrolled BM   Stairs            Wheelchair Mobility    Modified Rankin (Stroke Patients Only)       Balance                                             Pertinent Vitals/Pain Pain Assessment: No/denies pain    Home Living Family/patient expects to be discharged to:: Private residence Living Arrangements: Other  relatives(grandson) Available Help at Discharge: Personal care attendant;Family Type of Home: House Home Access: Stairs to enter Entrance Stairs-Rails: Right Entrance Stairs-Number of Steps: 3 Home Layout: Two level Home Equipment: Walker - 2 wheels;Bedside commode;Wheelchair - manual;Cane - single point Additional Comments: Information from prior encounter and pt confirmed no changes    Prior Function Level of Independence: Needs assistance   Gait / Transfers Assistance Needed: Amb with rolling walker with modified independence  ADL's / Homemaking Assistance Needed: has caregivers to assist with bathing        Hand Dominance        Extremity/Trunk Assessment   Upper Extremity Assessment Upper Extremity Assessment: Defer to OT evaluation    Lower Extremity Assessment Lower Extremity Assessment: Generalized weakness       Communication   Communication: No difficulties  Cognition Arousal/Alertness: Awake/alert Behavior During Therapy: WFL for tasks assessed/performed Overall Cognitive Status: Within Functional Limits for tasks assessed                                        General Comments      Exercises     Assessment/Plan    PT Assessment Patient needs continued PT services  PT Problem List Decreased strength;Decreased activity tolerance;Decreased balance;Decreased mobility       PT Treatment Interventions DME instruction;Gait training;Functional mobility training;Therapeutic activities;Therapeutic exercise;Balance training;Patient/family education    PT Goals (Current goals can be found in the Care Plan section)  Acute Rehab PT Goals Patient Stated Goal: Not stated PT Goal Formulation: With patient Time For Goal Achievement: 09/20/17 Potential to Achieve Goals: Good    Frequency Min 3X/week   Barriers to discharge        Co-evaluation               AM-PAC PT "6 Clicks" Daily Activity  Outcome Measure Difficulty turning  over in bed (including adjusting bedclothes, sheets and blankets)?: Unable Difficulty moving from lying on back to sitting on the side of the bed? : Unable Difficulty sitting down on and standing up from a chair with arms (e.g., wheelchair, bedside commode, etc,.)?: Unable Help needed moving to and from a bed to chair (including a wheelchair)?: Total Help needed walking in hospital room?: Total Help needed climbing 3-5 steps with a railing? : Total 6 Click Score: 6    End of Session   Activity Tolerance: Patient limited by fatigue;Other (comment)(Limited by bowel incontinence) Patient left: in bed;with call bell/phone within reach;with family/visitor present Nurse Communication: Mobility status;Other (comment)(Incontinence of liquid stool) PT Visit Diagnosis: Unsteadiness on feet (R26.81);Other abnormalities of gait and mobility (R26.89);Muscle weakness (generalized) (M62.81)    Time: [id number] PT Time Calculation (min) (ACUTE ONLY): 20 min   Charges:   PT Evaluation $PT Eval Low Complexity: 1 Low     PT G CodesMarland Kitchen:        [facility] LtdCary Eller Sweis PT [phone]   Angelina OkCary W [facility] Center[facility]ok 09/06/2017, 10:32 AM

## 2017-09-06 NOTE — Progress Notes (Signed)
PROGRESS NOTE    Nicole Key  ZOX:[id number] DOB: 17-Dec-1927 DOA: 09/01/2017 PCP: Geoffry Paradise, MD   Brief Narrative: Patient is an 84 y.o. female with past medical history significant for large hiatal hernia with history of incarcerated hiatal hernia with obstruction, aspiration pneumonia, hypertension, fibromyalgia, anxiety, GERD, and malnutrition. Patient was admitted with sepsis secondary to PNA and colitis. GI bleed was documented, evaluated by GI who recommended support care. Patient continued to be generally weak. Palliative was consulted for goals of care.   No new complaints. Patient continues to have watery stool. Low potassium noted. Patient was seen alongside patient's friend. Patient will complete course of antibiotics.  Assessment & Plan:   Principal Problem:   Sepsis (HCC) Active Problems:   Meniere's disease   Anxiety   Essential hypertension   GERD (gastroesophageal reflux disease)   Hiatal hernia   AKI (acute kidney injury) (HCC)   GI bleed   Hematemesis   Melena   Hypokalemia   Leukocytosis   Elevated lactic acid level   Intractable nausea and vomiting   Tachycardia   Palliative care by specialist   Goals of care, counseling/discussion  1-Sepsis; PNA vs colitis - Complete course of antibiotics Presents with leukocytosis , lactic acidosis.  WBC trending down. 34---12 Lactic acid decrease from 12---4.9 Complete course of antibiotics.ved.   GI bleed, hematemesis - Stable. Hb drop from 11 to 9.4 on admission.  Transfused  one unit PRBC 1-06 and 1-07 Protonix gtt.  Hemoglobin has been stable.  Started on diet   Hypomagnesemia - Repeat levels. Check phosphorus. Received IV mg    Hypokalemia - Replete. Monitor.  Acute Encephalopathy;  She is lethargic, only moan.  Suspect related to acute illness.  She is more alert today  Colitis;  C diff negative.  GI pathogen negative.  diarrhea, bloody stool.  Support care, IV fluids antibiotics,    Start diet today    Acute hypoxic respiratory failure; in setting PNA. Aspiration - Improving. Guarded prognosis Chest x ray, atelectasis vs infection.  Continue with zosyn.  Incentive spirometry  Speech evaluation.    AKI; in setting of sepsis, hypovolemia. Resolved. Cr peak to 1.5. Improved with IV fluids.    HTN; tachycardia - Continue to optimize. IV Metoprolol PRN.  BP increasing, resume Cardizem.  PRN Hydralazine.   Fibromyalgia. Appears stable at baseline.     DVT prophylaxis: SCD Code Status: Partial code.  Family Communication: grandson at bedside.  Disposition Plan: Remain in the step down unit.   Consultants:   GI   Palliative care consult    Procedures:   none   Antimicrobials:  Zosyn 09-01-2017   Subjective: She is alert, she appears weak. She report abdominal pain has improved.   Objective: Vitals:   09/06/17 0529 09/06/17 0900 09/06/17 1035 09/06/17 1148  BP: 139/69 (!) 147/80 (!) 158/77 (!) 142/91  Pulse: 77 98 85 99  Resp: 18 (!) 21 19 19   Temp: 98.2 F (36.8 C) 98.2 F (36.8 C)    TempSrc: Oral Oral    SpO2: 100% 98% 98% 100%  Weight: 43.4 kg (95 lb 10.9 oz)     Height:        Intake/Output Summary (Last 24 hours) at 09/06/2017 1601 Last data filed at 09/06/2017 1500 Gross per 24 hour  Intake 1375 ml  Output 700 ml  Net 675 ml   Filed Weights   09/02/17 0334 09/03/17 0427 09/06/17 0529  Weight: 44 kg (97 lb 0 oz) 43.2  kg (95 lb 3.8 oz) 43.4 kg (95 lb 10.9 oz)    Examination:  General exam: alert, thin appearing  Respiratory system ; Bilateral ronchus.  Cardiovascular system:  S 1, S 2 RRR Gastrointestinal system: BS present, soft, mild tenderness.  Central nervous system: alert, answer questions/  Extremities: no edema    Data Reviewed: I have personally reviewed following labs and imaging studies  CBC: Recent Labs  Lab 09/01/17 1116  09/02/17 0224  09/03/17 1407 09/03/17 2119 09/04/17 0316 09/04/17 0808  09/05/17 0907 09/06/17 0344  WBC  --    < > 34.0*  --  16.2*  --   --  13.3* 12.9* 14.1*  NEUTROABS 29.2*  --   --   --   --   --   --   --   --   --   HGB  --    < > 9.4*   < > 10.2* 10.0* 10.8* 10.9* 11.5* 10.3*  HCT  --    < > 29.6*   < > 32.6* 31.1* 34.4* 33.7* 37.1 32.9*  MCV  --    < > 93.7  --  91.3  --   --  93.9 91.6 91.4  PLT  --    < > 329  --  250  --   --  275 326 324   < > = values in this interval not displayed.   Basic Metabolic Panel: Recent Labs  Lab 09/02/17 0224 09/02/17 1526 09/03/17 1407 09/04/17 0808 09/05/17 0907 09/06/17 0344  NA 141  --  145 144 140 139  K 3.1*  --  3.1* 3.5 2.6* 3.1*  CL 108  --  116* 114* 107 108  CO2 24  --  24 23 26 26   GLUCOSE 132*  --  114* 99 98 128*  BUN 43*  --  14 10 <5* <5*  CREATININE 1.13*  --  0.53 0.49 0.46 0.44  CALCIUM 7.3*  --  7.4* 7.4* 7.4* 7.5*  MG  --  1.4*  --  2.0  --   --    GFR: Estimated Creatinine Clearance: 33.9 mL/min (by C-G formula based on SCr of 0.44 mg/dL). Liver Function Tests: Recent Labs  Lab 09/01/17 0814  AST 47*  ALT 19  ALKPHOS 62  BILITOT 1.0  PROT 6.5  ALBUMIN 3.1*   Recent Labs  Lab 09/01/17 0814  LIPASE 27   No results for input(s): AMMONIA in the last 168 hours. Coagulation Profile: Recent Labs  Lab 09/01/17 0814 09/01/17 1807  INR 1.12 1.28   Cardiac Enzymes: No results for input(s): CKTOTAL, CKMB, CKMBINDEX, TROPONINI in the last 168 hours. BNP (last 3 results) No results for input(s): PROBNP in the last 8760 hours. HbA1C: No results for input(s): HGBA1C in the last 72 hours. CBG: Recent Labs  Lab 09/05/17 1844 09/05/17 2135 09/06/17 0556 09/06/17 0851 09/06/17 1151  GLUCAP 130* 113* 121* 133* 129*   Lipid Profile: No results for input(s): CHOL, HDL, LDLCALC, TRIG, CHOLHDL, LDLDIRECT in the last 72 hours. Thyroid Function Tests: No results for input(s): TSH, T4TOTAL, FREET4, T3FREE, THYROIDAB in the last 72 hours. Anemia Panel: No results for  input(s): VITAMINB12, FOLATE, FERRITIN, TIBC, IRON, RETICCTPCT in the last 72 hours. Sepsis Labs: Recent Labs  Lab 09/01/17 0841 09/01/17 1244 09/01/17 1251 09/01/17 1545  LATICACIDVEN 12.68* 4.7* 4.87* 4.9*    Recent Results (from the past 240 hour(s))  Gastrointestinal Panel by PCR , Stool  Status: None   Collection Time: 09/01/17  8:54 AM  Result Value Ref Range Status   Campylobacter species NOT DETECTED NOT DETECTED Final   Plesimonas shigelloides NOT DETECTED NOT DETECTED Final   Salmonella species NOT DETECTED NOT DETECTED Final   Yersinia enterocolitica NOT DETECTED NOT DETECTED Final   Vibrio species NOT DETECTED NOT DETECTED Final   Vibrio cholerae NOT DETECTED NOT DETECTED Final   Enteroaggregative E coli (EAEC) NOT DETECTED NOT DETECTED Final   Enteropathogenic E coli (EPEC) NOT DETECTED NOT DETECTED Final   Enterotoxigenic E coli (ETEC) NOT DETECTED NOT DETECTED Final   Shiga like toxin producing E coli (STEC) NOT DETECTED NOT DETECTED Final   Shigella/Enteroinvasive E coli (EIEC) NOT DETECTED NOT DETECTED Final   Cryptosporidium NOT DETECTED NOT DETECTED Final   Cyclospora cayetanensis NOT DETECTED NOT DETECTED Final   Entamoeba histolytica NOT DETECTED NOT DETECTED Final   Giardia lamblia NOT DETECTED NOT DETECTED Final   Adenovirus F40/41 NOT DETECTED NOT DETECTED Final   Astrovirus NOT DETECTED NOT DETECTED Final   Norovirus GI/GII NOT DETECTED NOT DETECTED Final   Rotavirus A NOT DETECTED NOT DETECTED Final   Sapovirus (I, II, IV, and V) NOT DETECTED NOT DETECTED Final    Comment: Performed at [facility], [address] [place], [place] [address]  C difficile quick scan w PCR reflex     Status: None   Collection Time: 09/01/17  8:54 AM  Result Value Ref Range Status   C Diff antigen NEGATIVE NEGATIVE Final   C Diff toxin NEGATIVE NEGATIVE Final   C Diff interpretation No C. difficile detected.  Final  Culture, blood (routine x 2)     Status:  None   Collection Time: 09/01/17  9:06 AM  Result Value Ref Range Status   Specimen Description BLOOD RIGHT FOREARM  Final   Special Requests   Final    BOTTLES DRAWN AEROBIC AND ANAEROBIC Blood Culture results may not be optimal due to an inadequate volume of blood received in culture bottles   Culture NO GROWTH 5 DAYS  Final   Report Status 09/06/2017 FINAL  Final  Culture, blood (routine x 2)     Status: None   Collection Time: 09/01/17 11:16 AM  Result Value Ref Range Status   Specimen Description BLOOD RIGHT ANTECUBITAL  Final   Special Requests   Final    BOTTLES DRAWN AEROBIC AND ANAEROBIC Blood Culture results may not be optimal due to an inadequate volume of blood received in culture bottles   Culture NO GROWTH 5 DAYS  Final   Report Status 09/06/2017 FINAL  Final         Radiology Studies: No results found.      Scheduled Meds: . diltiazem  30 mg Oral TID  . pantoprazole (PROTONIX) IV  40 mg Intravenous Q12H  . potassium chloride  40 mEq Oral BID   Continuous Infusions: . dextrose 5 % and 0.9% NaCl 50 mL/hr at 09/06/17 1036  . piperacillin-tazobactam (ZOSYN)  IV Stopped (09/06/17 1500)     LOS: 5 days    Time spent: 35 minutes.     Barnetta Chapel, MD [facility] Pager [phone]  If 7PM-7AM, please contact night-coverage [email or web address] Password [email or web address] 09/06/2017, 4:01 PM

## 2017-09-06 NOTE — Progress Notes (Signed)
Chaplain Nicole Key came to room responding to system generated request to Create or Update Advanced Directive. Upon conversation with patient and family they are not in need of AD consult / execution.

## 2017-09-06 NOTE — Progress Notes (Signed)
OT Cancellation Note  Patient Details Name: Nicole BlonderDorothy L Key MRN: [id number] DOB: 09-Dec-1927   Cancelled Treatment:    Reason Eval/Treat Not Completed: Patient at procedure or test/ unavailable(Palliative talking with pt/family. Will attempt later if able)  [facility] S[facility]ic[facility] LLCWARD,HILLARY  Arcadia Gorgas, OT/L  [phone] 09/06/2017 09/06/2017, 9:47 AM

## 2017-09-06 NOTE — Progress Notes (Signed)
Occupational Therapy Evaluation Patient Details Name: Nicole Key MRN: [id number] DOB: 08-02-1928 Today's Date: 09/06/2017    History of Present Illness Pt adm with sepsis likely due to colitis or PNA. PMH - HTN, aspiration PNA, fibromyalgia, incarcerated hiatal hernia, anxiety, malnutrition   Clinical Impression   PTA, pt states she lived at home with her grandson and her daughter and was able to complete her self care with occasional min A. Pt states she used a RW for [place] distances. Evlauation limited due to fatigue - "I'm too tired". Pt states she is not  "going anywhere but home after the hospital". Pt only able to tolerate bed mobility, which she required Mod A with and Max A with ADL. If pt goes home, she will most likely need 24/7 physical assistance. Will follow acutely to facilitate safe DC to next venue of care.     Follow Up Recommendations  Supervision/Assistance - 24 hour ; may need SNF if family unable to care for pt at this level   Equipment Recommendations       Recommendations for Other Services       Precautions / Restrictions Precautions Precautions: Fall Restrictions Weight Bearing Restrictions: No      Mobility Bed Mobility Overal bed mobility: Needs Assistance Bed Mobility: Rolling Rolling: Mod assist           Transfers                 General transfer comment: Pt declined    Balance                                           ADL either performed or assessed with clinical judgement   ADL Overall ADL's : Needs assistance/impaired Eating/Feeding: Set up Eating/Feeding Details (indicate cue type and reason): pt "too weak to cut her meat"     Upper Body Bathing: Minimal assistance   Lower Body Bathing: Maximal assistance;Bed level   Upper Body Dressing : Moderate assistance;Bed level   Lower Body Dressing: Maximal assistance;Bed level               Functional mobility during ADLs: (limited to bed  level ) General ADL Comments: Evaluation limited to bed level due to weaness and pt declining further mobility     Vision         Perception     Praxis      Pertinent Vitals/Pain Pain Assessment: Faces Faces Pain Scale: Hurts even more Pain Location: abdomen Pain Descriptors / Indicators: Aching;Discomfort Pain Intervention(s): Limited activity within patient's tolerance;Premedicated before session     Hand Dominance Left   Extremity/Trunk Assessment Upper Extremity Assessment Upper Extremity Assessment: Generalized weakness(hx of B shoulder problems)   Lower Extremity Assessment Lower Extremity Assessment: Defer to PT evaluation   Cervical / Trunk Assessment Cervical / Trunk Assessment: Normal   Communication Communication Communication: No difficulties   Cognition Arousal/Alertness: Awake/alert Behavior During Therapy: WFL for tasks assessed/performed Overall Cognitive Status: No family/caregiver present to determine baseline cognitive functioning                                 General Comments: Appears WFL for ADL tasks   General Comments       Exercises     Shoulder Instructions      Home Living  Family/patient expects to be discharged to:: Private residence Living Arrangements: Other relatives(grandson) Available Help at Discharge: Personal care attendant;Family(PCA 3x/wk) Type of Home: House Home Access: Stairs to enter Entergy CorporationEntrance Stairs-Number of Steps: 3 Entrance Stairs-Rails: Right Home Layout: Two level Alternate Level Stairs-Number of Steps: bedroom is upstairs, has assist to negotiate, but doesn't usually come downstairs   Bathroom Shower/Tub: Producer, television/film/videoWalk-in shower   Bathroom Toilet: Standard     Home Equipment: Environmental consultantWalker - 2 wheels;Bedside commode;Wheelchair - manual;Cane - single point   Additional Comments: Information from prior encounter and pt confirmed no changes      Prior Functioning/Environment Level of Independence: Needs  assistance  Gait / Transfers Assistance Needed: Amb with rolling walker with modified independence ADL's / Homemaking Assistance Needed: Pt states she was bathing herself PTA unless she was having a "bad day"            OT Problem List: Decreased strength;Decreased activity tolerance;Impaired balance (sitting and/or standing);Decreased knowledge of use of DME or AE;Cardiopulmonary status limiting activity;Pain;Increased edema      OT Treatment/Interventions: Self-care/ADL training;Therapeutic exercise;DME and/or AE instruction;Therapeutic activities;Patient/family education;Balance training    OT Goals(Current goals can be found in the care plan section) Acute Rehab OT Goals Patient Stated Goal: Not stated OT Goal Formulation: With patient Time For Goal Achievement: 09/20/17 Potential to Achieve Goals: Fair  OT Frequency: Min 2X/week   Barriers to D/C:            Co-evaluation              AM-PAC PT "6 Clicks" Daily Activity     Outcome Measure Help from another person eating meals?: A Little Help from another person taking care of personal grooming?: A Little Help from another person toileting, which includes using toliet, bedpan, or urinal?: Total Help from another person bathing (including washing, rinsing, drying)?: A Lot Help from another person to put on and taking off regular upper body clothing?: A Lot Help from another person to put on and taking off regular lower body clothing?: Total 6 Click Score: 12   End of Session Equipment Utilized During Treatment: Oxygen(6L) Nurse Communication: Mobility status  Activity Tolerance: Patient limited by fatigue Patient left: in bed;with call bell/phone within reach;with bed alarm set;with nursing/sitter in room  OT Visit Diagnosis: Muscle weakness (generalized) (M62.81);Other abnormalities of gait and mobility (R26.89);Pain Pain - part of body: (abdomen)                Time: 1610-96041230-1245 OT Time Calculation (min): 15  min Charges:  OT General Charges $OT Visit: 1 Visit OT Evaluation $OT Eval Moderate Complexity: 1 Mod G-Codes:     Merranda Bolls, OT/L  [phone] 09/06/2017  Lamichael Youkhana,HILLARY 09/06/2017, 1:07 PM

## 2017-09-07 ENCOUNTER — Inpatient Hospital Stay (HOSPITAL_COMMUNITY): Payer: Medicare Other

## 2017-09-07 DIAGNOSIS — E876 Hypokalemia: Secondary | ICD-10-CM

## 2017-09-07 LAB — GLUCOSE, CAPILLARY
GLUCOSE-CAPILLARY: 111 mg/dL — AB (ref 65–99)
GLUCOSE-CAPILLARY: 115 mg/dL — AB (ref 65–99)
GLUCOSE-CAPILLARY: 117 mg/dL — AB (ref 65–99)
GLUCOSE-CAPILLARY: 128 mg/dL — AB (ref 65–99)
Glucose-Capillary: 106 mg/dL — ABNORMAL HIGH (ref 65–99)
Glucose-Capillary: 115 mg/dL — ABNORMAL HIGH (ref 65–99)
Glucose-Capillary: 125 mg/dL — ABNORMAL HIGH (ref 65–99)

## 2017-09-07 LAB — RENAL FUNCTION PANEL
Albumin: 1.8 g/dL — ABNORMAL LOW (ref 3.5–5.0)
Anion gap: 7 (ref 5–15)
BUN: <5 mg/dL — ABNORMAL LOW (ref 6–20)
CO2: 24 mmol/L (ref 22–32)
Calcium: 7.6 mg/dL — ABNORMAL LOW (ref 8.9–10.3)
Chloride: 107 mmol/L (ref 101–111)
Creatinine, Ser: 0.48 mg/dL (ref 0.44–1.00)
GFR calc Af Amer: >60 mL/min (ref >60)
GFR calc non Af Amer: >60 mL/min (ref >60)
Glucose, Bld: 120 mg/dL — ABNORMAL HIGH (ref 65–99)
Phosphorus: 2.1 mg/dL — ABNORMAL LOW (ref 2.5–4.6)
Potassium: 3.2 mmol/L — ABNORMAL LOW (ref 3.5–5.1)
Sodium: 138 mmol/L (ref 135–145)

## 2017-09-07 LAB — MAGNESIUM: Magnesium: 1.9 mg/dL (ref 1.7–2.4)

## 2017-09-07 LAB — PHOSPHORUS: Phosphorus: 2.7 mg/dL (ref 2.5–4.6)

## 2017-09-07 MED ORDER — PANTOPRAZOLE SODIUM 40 MG PO TBEC
40.0000 mg | DELAYED_RELEASE_TABLET | Freq: Two times a day (BID) | ORAL | Status: DC
  Administered 2017-09-07 – 2017-09-14 (×15): 40 mg via ORAL
  Filled 2017-09-07 (×15): qty 1

## 2017-09-07 MED ORDER — MAGNESIUM SULFATE 2 GM/50ML IV SOLN
2.0000 g | Freq: Once | INTRAVENOUS | Status: AC
  Administered 2017-09-07: 2 g via INTRAVENOUS
  Filled 2017-09-07: qty 50

## 2017-09-07 MED ORDER — POTASSIUM CHLORIDE 20 MEQ PO PACK
40.0000 meq | PACK | Freq: Four times a day (QID) | ORAL | Status: AC
  Administered 2017-09-07 (×2): 40 meq via ORAL
  Filled 2017-09-07 (×2): qty 2

## 2017-09-07 MED ORDER — POTASSIUM PHOSPHATES 15 MMOLE/5ML IV SOLN
10.0000 mmol | Freq: Once | INTRAVENOUS | Status: AC
  Administered 2017-09-07: 10 mmol via INTRAVENOUS
  Filled 2017-09-07: qty 3.33

## 2017-09-07 NOTE — Progress Notes (Signed)
PT Cancellation Note  Patient Details Name: Nicole Key MRN: [id number] DOB: 1928-04-18   Cancelled Treatment:    Reason Eval/Treat Not Completed: Fatigue/lethargy limiting ability to participate. Pt unsure if she wants to stay on PT caseload, but is willing for us to attempt PT treatment session on Monday if still admitted. Will continue to follow acutely.  Ina HomesJaclyn Carollee Nussbaumer, PT, DPT Acute Rehab Services  Pager: [phone]  Malachy ChamberJaclyn L Patrick Salemi 09/07/2017, 10:08 AM

## 2017-09-07 NOTE — Progress Notes (Signed)
Clinical Social Worker went into the room to discuss disposition plan with patient. Patient was extremely tired and sleepy during assessment. Patent stated she will discharge home to the care of daughter and grandson. Patient stated grandson has hired extra help in the home to assist with her care upon discharge.    Marrianne MoodAshley Aaryan Essman, MSW,  Amgen IncLCSWA [phone]

## 2017-09-07 NOTE — Care Management Note (Signed)
Case Management Note Donn PieriniKristi Jihad Brownlow RN, BSN Unit 4E-Case Manager [phone]  Patient Details  Name: Nicole BlonderDorothy L Key MRN: [id number] Date of Birth: 05/07/1928  Subjective/Objective:   Pt admitted with sepsis, abd pain                 Action/Plan: PTA pt lived at home with grandson, PC consulted for GOC, [facility] Orders placed per MD for HHRN/PT/aide- spoke with pt and niece at the bedside - [facility]- per choice they would like to use [facility] for services- discussed DME needs- pt reports she has everything except she would like a shower bench- family to look into this as niece thinks she has one at home that pt can use- explained insurance does not cover cost. Pt has caregivers in place at home- and grandson to look into adding additional assistance for pt. [facility] - referral has been accepted for HHRN/PT/aide. Potential d/c over the weekend vs Monday. CM to continue to follow.   Expected Discharge Date:                  Expected Discharge Plan:  Home w Home Health Services  In-House Referral:  Clinical Social Work  Discharge planning Services  CM Consult  Post Acute Care Choice:  Home Health Choice offered to:  Patient  DME Arranged:    DME Agency:     HH Arranged:  RN, PT, Nurse's Aide HH Agency:  [facility]  Status of Service:  In process, will continue to follow  If discussed at Long Length of Stay Meetings, dates discussed:   Discharge Disposition: home/home health    Additional Comments:  Darrold SpanWebster, Albertia Carvin Hall, RN 09/07/2017, 4:10 PM

## 2017-09-07 NOTE — Progress Notes (Signed)
PROGRESS NOTE    Nicole Key  ZOX:[id number] DOB: 01/29/1928 DOA: 09/01/2017 PCP: Geoffry Paradise, MD   Brief Narrative: Patient is an 84 y.o. female with past medical history significant for large hiatal hernia with history of incarcerated hiatal hernia with obstruction, aspiration pneumonia, hypertension, fibromyalgia, anxiety, GERD, and malnutrition. Patient was admitted with sepsis secondary to PNA and colitis. GI bleed was documented, evaluated by GI who recommended support care. Patient continued to be generally weak. Palliative was consulted for goals of care.   Watery stool is improving. Electrolyte abnormality noted. Patient insists on being discharged back home. Patient understands, and voices that her prognosis is guarded, and would like to spend time with her family at home. Will plan for Home with Home Health PT/Nursing/Aide. At some point, depending on patient's progress, follow up at home may have to be transitioned to Palliative care team/Hospice.  Overall, prognosis is guarded.  Seen alongside patient's friend. All questions were answered. CT Chest without contrast (Rule out pneumonia)  Assessment & Plan:   Principal Problem:   Sepsis (HCC) Active Problems:   Meniere's disease   Anxiety   Essential hypertension   GERD (gastroesophageal reflux disease)   Hiatal hernia   AKI (acute kidney injury) (HCC)   GI bleed   Hematemesis   Melena   Hypokalemia   Leukocytosis   Elevated lactic acid level   Intractable nausea and vomiting   Tachycardia   Palliative care by specialist   Goals of care, counseling/discussion  1-Sepsis; PNA vs colitis - Complete course of antibiotics Presents with leukocytosis , lactic acidosis.  WBC trending down. 34---12 Lactic acid decrease from 12---4.9 Complete course of antibiotics.ved.   GI bleed, hematemesis - Stable. Hb drop from 11 to 9.4 on admission.  Transfused  one unit PRBC 1-06 and 1-07 Protonix gtt.  Hemoglobin has  been stable.  Started on diet   Hypomagnesemia/Hypokalemia and Hypophosphatemia - Replete   Acute Encephalopathy: Resolved.  Colitis;  C diff negative.  GI pathogen negative.  diarrhea, bloody stool.  Support care, IV fluids antibiotics,  Start diet today    Acute hypoxic respiratory failure; in setting PNA. Aspiration - Improving. Guarded prognosis Chest x ray, atelectasis vs infection.  Continue with zosyn.  Incentive spirometry  Speech evaluation.    AKI; in setting of sepsis, hypovolemia. Resolved. Cr peak to 1.5. Improved with IV fluids.    HTN; tachycardia - Continue to optimize. IV Metoprolol PRN.  BP increasing, resume Cardizem.  PRN Hydralazine.   Fibromyalgia. Appears stable at baseline.   DVT prophylaxis: SCD Code Status: Partial code.  Family Communication: grandson at bedside.  Disposition Plan: Home with Home Health.   Consultants:   GI   Palliative care consult    Procedures:   none   Antimicrobials:  Zosyn 09-01-2017   Subjective: She is alert, she appears weak. Diarrhea is improving. Abnormal electrolytes noted.  Objective: Vitals:   09/07/17 0400 09/07/17 0444 09/07/17 0800 09/07/17 1627  BP:   (!) 149/71 (!) 148/72  Pulse: 88   87  Resp:   (!) 26 (!) 28  Temp: 97.8 F (36.6 C)  98.5 F (36.9 C) 98.3 F (36.8 C)  TempSrc: Oral  Oral Oral  SpO2: 98%  97% 98%  Weight:  42.7 kg (94 lb 2.2 oz)    Height:        Intake/Output Summary (Last 24 hours) at 09/07/2017 1831 Last data filed at 09/07/2017 1717 Gross per 24 hour  Intake 750 ml  Output 500 ml  Net 250 ml   Filed Weights   09/03/17 0427 09/06/17 0529 09/07/17 0444  Weight: 43.2 kg (95 lb 3.8 oz) 43.4 kg (95 lb 10.9 oz) 42.7 kg (94 lb 2.2 oz)    Examination:  General exam: alert, thin appearing  Respiratory system ; Bilateral ronchus.  Cardiovascular system:  S 1, S 2 RRR Gastrointestinal system: BS present, soft, mild tenderness.  Central nervous system:  alert, answer questions/  Extremities: no edema    Data Reviewed: I have personally reviewed following labs and imaging studies  CBC: Recent Labs  Lab 09/01/17 1116  09/02/17 0224  09/03/17 1407 09/03/17 2119 09/04/17 0316 09/04/17 0808 09/05/17 0907 09/06/17 0344  WBC  --    < > 34.0*  --  16.2*  --   --  13.3* 12.9* 14.1*  NEUTROABS 29.2*  --   --   --   --   --   --   --   --   --   HGB  --    < > 9.4*   < > 10.2* 10.0* 10.8* 10.9* 11.5* 10.3*  HCT  --    < > 29.6*   < > 32.6* 31.1* 34.4* 33.7* 37.1 32.9*  MCV  --    < > 93.7  --  91.3  --   --  93.9 91.6 91.4  PLT  --    < > 329  --  250  --   --  275 326 324   < > = values in this interval not displayed.   Basic Metabolic Panel: Recent Labs  Lab 09/02/17 1526 09/03/17 1407 09/04/17 0808 09/05/17 0907 09/06/17 0344 09/06/17 1601 09/07/17 0158  NA  --  145 144 140 139  --  138  K  --  3.1* 3.5 2.6* 3.1*  --  3.2*  CL  --  116* 114* 107 108  --  107  CO2  --  24 23 26 26   --  24  GLUCOSE  --  114* 99 98 128*  --  120*  BUN  --  14 10 <5* <5*  --  <5*  CREATININE  --  0.53 0.49 0.46 0.44  --  0.48  CALCIUM  --  7.4* 7.4* 7.4* 7.5*  --  7.6*  MG 1.4*  --  2.0  --   --  1.5*  --   PHOS  --   --   --   --   --   --  2.1*   GFR: Estimated Creatinine Clearance: 33.4 mL/min (by C-G formula based on SCr of 0.48 mg/dL). Liver Function Tests: Recent Labs  Lab 09/01/17 0814 09/07/17 0158  AST 47*  --   ALT 19  --   ALKPHOS 62  --   BILITOT 1.0  --   PROT 6.5  --   ALBUMIN 3.1* 1.8*   Recent Labs  Lab 09/01/17 0814  LIPASE 27   No results for input(s): AMMONIA in the last 168 hours. Coagulation Profile: Recent Labs  Lab 09/01/17 0814 09/01/17 1807  INR 1.12 1.28   Cardiac Enzymes: No results for input(s): CKTOTAL, CKMB, CKMBINDEX, TROPONINI in the last 168 hours. BNP (last 3 results) No results for input(s): PROBNP in the last 8760 hours. HbA1C: No results for input(s): HGBA1C in the last 72  hours. CBG: Recent Labs  Lab 09/07/17 0021 09/07/17 0441 09/07/17 0803 09/07/17 1104 09/07/17 1627  GLUCAP 111* 115* 117* 128* 115*  Lipid Profile: No results for input(s): CHOL, HDL, LDLCALC, TRIG, CHOLHDL, LDLDIRECT in the last 72 hours. Thyroid Function Tests: No results for input(s): TSH, T4TOTAL, FREET4, T3FREE, THYROIDAB in the last 72 hours. Anemia Panel: No results for input(s): VITAMINB12, FOLATE, FERRITIN, TIBC, IRON, RETICCTPCT in the last 72 hours. Sepsis Labs: Recent Labs  Lab 09/01/17 0841 09/01/17 1244 09/01/17 1251 09/01/17 1545  LATICACIDVEN 12.68* 4.7* 4.87* 4.9*    Recent Results (from the past 240 hour(s))  Gastrointestinal Panel by PCR , Stool     Status: None   Collection Time: 09/01/17  8:54 AM  Result Value Ref Range Status   Campylobacter species NOT DETECTED NOT DETECTED Final   Plesimonas shigelloides NOT DETECTED NOT DETECTED Final   Salmonella species NOT DETECTED NOT DETECTED Final   Yersinia enterocolitica NOT DETECTED NOT DETECTED Final   Vibrio species NOT DETECTED NOT DETECTED Final   Vibrio cholerae NOT DETECTED NOT DETECTED Final   Enteroaggregative E coli (EAEC) NOT DETECTED NOT DETECTED Final   Enteropathogenic E coli (EPEC) NOT DETECTED NOT DETECTED Final   Enterotoxigenic E coli (ETEC) NOT DETECTED NOT DETECTED Final   Shiga like toxin producing E coli (STEC) NOT DETECTED NOT DETECTED Final   Shigella/Enteroinvasive E coli (EIEC) NOT DETECTED NOT DETECTED Final   Cryptosporidium NOT DETECTED NOT DETECTED Final   Cyclospora cayetanensis NOT DETECTED NOT DETECTED Final   Entamoeba histolytica NOT DETECTED NOT DETECTED Final   Giardia lamblia NOT DETECTED NOT DETECTED Final   Adenovirus F40/41 NOT DETECTED NOT DETECTED Final   Astrovirus NOT DETECTED NOT DETECTED Final   Norovirus GI/GII NOT DETECTED NOT DETECTED Final   Rotavirus A NOT DETECTED NOT DETECTED Final   Sapovirus (I, II, IV, and V) NOT DETECTED NOT DETECTED Final     Comment: Performed at [facility], [address] [place], [place] [address]  C difficile quick scan w PCR reflex     Status: None   Collection Time: 09/01/17  8:54 AM  Result Value Ref Range Status   C Diff antigen NEGATIVE NEGATIVE Final   C Diff toxin NEGATIVE NEGATIVE Final   C Diff interpretation No C. difficile detected.  Final  Culture, blood (routine x 2)     Status: None   Collection Time: 09/01/17  9:06 AM  Result Value Ref Range Status   Specimen Description BLOOD RIGHT FOREARM  Final   Special Requests   Final    BOTTLES DRAWN AEROBIC AND ANAEROBIC Blood Culture results may not be optimal due to an inadequate volume of blood received in culture bottles   Culture NO GROWTH 5 DAYS  Final   Report Status 09/06/2017 FINAL  Final  Culture, blood (routine x 2)     Status: None   Collection Time: 09/01/17 11:16 AM  Result Value Ref Range Status   Specimen Description BLOOD RIGHT ANTECUBITAL  Final   Special Requests   Final    BOTTLES DRAWN AEROBIC AND ANAEROBIC Blood Culture results may not be optimal due to an inadequate volume of blood received in culture bottles   Culture NO GROWTH 5 DAYS  Final   Report Status 09/06/2017 FINAL  Final         Radiology Studies: No results found.      Scheduled Meds: . diltiazem  30 mg Oral TID  . pantoprazole  40 mg Oral BID  . potassium chloride  40 mEq Oral BID   Continuous Infusions: . dextrose 5 % and 0.9% NaCl 50  mL/hr at 09/07/17 0622  . piperacillin-tazobactam (ZOSYN)  IV Stopped (09/07/17 1625)     LOS: 6 days    Time spent: 35 minutes.     Barnetta Chapel, MD [facility] Pager [phone]  If 7PM-7AM, please contact night-coverage [email or web address] Password [email or web address] 09/07/2017, 6:30 PM

## 2017-09-08 DIAGNOSIS — E8809 Other disorders of plasma-protein metabolism, not elsewhere classified: Secondary | ICD-10-CM

## 2017-09-08 DIAGNOSIS — R197 Diarrhea, unspecified: Secondary | ICD-10-CM

## 2017-09-08 DIAGNOSIS — E43 Unspecified severe protein-calorie malnutrition: Secondary | ICD-10-CM

## 2017-09-08 LAB — RENAL FUNCTION PANEL
Albumin: 1.6 g/dL — ABNORMAL LOW (ref 3.5–5.0)
Anion gap: 4 — ABNORMAL LOW (ref 5–15)
BUN: <5 mg/dL — ABNORMAL LOW (ref 6–20)
CO2: 26 mmol/L (ref 22–32)
Calcium: 7.5 mg/dL — ABNORMAL LOW (ref 8.9–10.3)
Chloride: 108 mmol/L (ref 101–111)
Creatinine, Ser: 0.51 mg/dL (ref 0.44–1.00)
GFR calc Af Amer: >60 mL/min (ref >60)
GFR calc non Af Amer: >60 mL/min (ref >60)
Glucose, Bld: 113 mg/dL — ABNORMAL HIGH (ref 65–99)
Phosphorus: 2.5 mg/dL (ref 2.5–4.6)
Potassium: 3.6 mmol/L (ref 3.5–5.1)
Sodium: 138 mmol/L (ref 135–145)

## 2017-09-08 LAB — PHOSPHORUS: Phosphorus: 2.3 mg/dL — ABNORMAL LOW (ref 2.5–4.6)

## 2017-09-08 LAB — CBC WITH DIFFERENTIAL/PLATELET
Basophils Absolute: 0 10*3/uL (ref 0.0–0.1)
Basophils Relative: 0 %
Eosinophils Absolute: 0.2 10*3/uL (ref 0.0–0.7)
Eosinophils Relative: 2 %
HCT: 31.2 % — ABNORMAL LOW (ref 36.0–46.0)
Hemoglobin: 10.2 g/dL — ABNORMAL LOW (ref 12.0–15.0)
Lymphocytes Relative: 16 %
Lymphs Abs: 2.2 10*3/uL (ref 0.7–4.0)
MCH: 30.1 pg (ref 26.0–34.0)
MCHC: 32.7 g/dL (ref 30.0–36.0)
MCV: 92 fL (ref 78.0–100.0)
Monocytes Absolute: 1.5 10*3/uL — ABNORMAL HIGH (ref 0.1–1.0)
Monocytes Relative: 11 %
Neutro Abs: 9.6 10*3/uL — ABNORMAL HIGH (ref 1.7–7.7)
Neutrophils Relative %: 71 %
Platelets: 355 10*3/uL (ref 150–400)
RBC: 3.39 MIL/uL — ABNORMAL LOW (ref 3.87–5.11)
RDW: 16.8 % — ABNORMAL HIGH (ref 11.5–15.5)
WBC: 13.6 10*3/uL — ABNORMAL HIGH (ref 4.0–10.5)

## 2017-09-08 LAB — GLUCOSE, CAPILLARY
GLUCOSE-CAPILLARY: 114 mg/dL — AB (ref 65–99)
GLUCOSE-CAPILLARY: 112 mg/dL — AB (ref 65–99)
GLUCOSE-CAPILLARY: 110 mg/dL — AB (ref 65–99)
Glucose-Capillary: 96 mg/dL (ref 65–99)

## 2017-09-08 MED ORDER — MAGNESIUM SULFATE 2 GM/50ML IV SOLN
2.0000 g | Freq: Once | INTRAVENOUS | Status: AC
  Administered 2017-09-08: 2 g via INTRAVENOUS
  Filled 2017-09-08: qty 50

## 2017-09-08 MED ORDER — POTASSIUM & SODIUM PHOSPHATES 280-160-250 MG PO PACK
1.0000 | PACK | Freq: Three times a day (TID) | ORAL | Status: AC
  Administered 2017-09-08 – 2017-09-09 (×6): 1 via ORAL
  Filled 2017-09-08 (×6): qty 1

## 2017-09-08 NOTE — Progress Notes (Signed)
PROGRESS NOTE    Nicole Key  ZOX:[id number] DOB: Sep 21, 1927 DOA: 09/01/2017 PCP: Geoffry Paradise, MD   Brief Narrative: Patient is an 84 y.o. female with past medical history significant for large hiatal hernia with history of incarcerated hiatal hernia with obstruction, aspiration pneumonia, hypertension, fibromyalgia, anxiety, GERD, and malnutrition. Patient was admitted with sepsis secondary to PNA and colitis. GI bleed was documented, evaluated by GI who recommended support care. Patient continued to be generally weak. Palliative was consulted for goals of care.   Watery stool persists, but improving. Electrolyte abnormality noted. Patient insists on being discharged back home. Patient understands, and voices that her prognosis is guarded, and would like to spend time with her family at home. Will plan for Home with Home Health PT/Nursing/Aide. At some point, depending on patient's progress, follow up at home may have to be transitioned to Palliative care team/Hospice.  Overall, prognosis is guarded.  Patient seen today. Continues to report diarrhea. Potassium is 3.6 and Phosphorus 2.5 today. Albumin is 1.6 today, possibly suggestive of guarded prognosis versus severe malnutrition. CT Chest result noted.  Called patient's Merry Proud [phone] - Will meet with [name] in am.  Assessment & Plan:   Principal Problem:   Sepsis (HCC) Active Problems:   Meniere's disease   Anxiety   Essential hypertension   GERD (gastroesophageal reflux disease)   Hiatal hernia   AKI (acute kidney injury) (HCC)   GI bleed   Hematemesis   Melena   Hypokalemia   Leukocytosis   Elevated lactic acid level   Intractable nausea and vomiting   Tachycardia   Palliative care by specialist   Goals of care, counseling/discussion  1-Sepsis; PNA vs colitis - Complete course of antibiotics Presents with leukocytosis , lactic acidosis.  WBC trending down. 34---12 Lactic acid decrease from  12---4.9 Completed course of antibiotics.  GI bleed, hematemesis - Stable. Hb drop from 11 to 9.4 on admission.  Transfused  one unit PRBC 1-06 and 1-07 Protonix gtt.  Hemoglobin has been stable.  Started on diet   Hypomagnesemia/Hypokalemia and Hypophosphatemia - Replete   Acute Encephalopathy: Resolved.  Colitis:  C diff negative.  GI pathogen negative.  Diarrhea persists Antidiarrheal agent Support care, IV fluids antibiotics,  Pursue discharge once diarrhea and electrolytes are manageable.   Acute hypoxic respiratory failure; in setting PNA. Aspiration - Improving. Guarded prognosis Chest x ray, atelectasis vs infection.  Continue with zosyn.  Incentive spirometry  Speech evaluation.    AKI; in setting of sepsis, hypovolemia. Resolved. Cr peak to 1.5. Improved with IV fluids.    HTN; tachycardia - Continue to optimize. IV Metoprolol PRN.  BP increasing, resume Cardizem.  PRN Hydralazine.   Fibromyalgia. Appears stable at baseline.   Hypoalbuminemia: - Possible indicative of guarded prognosis versus severe malnutrition.  Bilateral effusion: Follow up CXR.  DVT prophylaxis: SCD Code Status: Partial code.  Family Communication: grandson at bedside.  Disposition Plan: Home with Home Health.   Consultants:   GI   Palliative care consult    Procedures:   none   Antimicrobials:  Zosyn 09-01-2017 (Completed course of antibiotics)  Subjective: She is alert, she appears weak. Diarrhea is improving. Abnormal electrolytes noted. Guarded prognosis.  Objective: Vitals:   09/08/17 0411 09/08/17 0801 09/08/17 1147 09/08/17 1932  BP: (!) 150/77 (!) 148/77 (!) 147/81 (!) 157/80  Pulse:  96 (!) 101 98  Resp: 16 16 (!) 22 (!) 22  Temp: 98.4 F (36.9 C) (!) 96.7 F (  35.9 C) 100.2 F (37.9 C) 98.7 F (37.1 C)  TempSrc: Oral Oral Axillary Oral  SpO2: 99% 97% 98% 97%  Weight:      Height:        Intake/Output Summary (Last 24 hours) at 09/08/2017  2013 Last data filed at 09/08/2017 0400 Gross per 24 hour  Intake 50 ml  Output -  Net 50 ml   Filed Weights   09/06/17 0529 09/07/17 0444 09/08/17 0322  Weight: 43.4 kg (95 lb 10.9 oz) 42.7 kg (94 lb 2.2 oz) 55.8 kg (123 lb 0.3 oz)    Examination:  General exam: alert, thin appearing  Respiratory system: Decreased air entry.  Cardiovascular system:  S 1, S 2 RRR Gastrointestinal system: BS present, soft, mild tenderness.  Central nervous system: alert, answer questions/  Extremities: Mild leg edema.  Data Reviewed: I have personally reviewed following labs and imaging studies  CBC: Recent Labs  Lab 09/03/17 1407  09/04/17 0316 09/04/17 0808 09/05/17 0907 09/06/17 0344 09/08/17 0220  WBC 16.2*  --   --  13.3* 12.9* 14.1* 13.6*  NEUTROABS  --   --   --   --   --   --  9.6*  HGB 10.2*   < > 10.8* 10.9* 11.5* 10.3* 10.2*  HCT 32.6*   < > 34.4* 33.7* 37.1 32.9* 31.2*  MCV 91.3  --   --  93.9 91.6 91.4 92.0  PLT 250  --   --  275 326 324 355   < > = values in this interval not displayed.   Basic Metabolic Panel: Recent Labs  Lab 09/02/17 1526  09/04/17 0808 09/05/17 0907 09/06/17 0344 09/06/17 1601 09/07/17 0158 09/07/17 1837 09/08/17 0220  NA  --    < > 144 140 139  --  138  --  138  K  --    < > 3.5 2.6* 3.1*  --  3.2*  --  3.6  CL  --    < > 114* 107 108  --  107  --  108  CO2  --    < > 23 26 26   --  24  --  26  GLUCOSE  --    < > 99 98 128*  --  120*  --  113*  BUN  --    < > 10 <5* <5*  --  <5*  --  <5*  CREATININE  --    < > 0.49 0.46 0.44  --  0.48  --  0.51  CALCIUM  --    < > 7.4* 7.4* 7.5*  --  7.6*  --  7.5*  MG 1.4*  --  2.0  --   --  1.5*  --  1.9  --   PHOS  --   --   --   --   --   --  2.1* 2.7 2.5  2.3*   < > = values in this interval not displayed.   GFR: Estimated Creatinine Clearance: 43.6 mL/min (by C-G formula based on SCr of 0.51 mg/dL). Liver Function Tests: Recent Labs  Lab 09/07/17 0158 09/08/17 0220  ALBUMIN 1.8* 1.6*   No  results for input(s): LIPASE, AMYLASE in the last 168 hours. No results for input(s): AMMONIA in the last 168 hours. Coagulation Profile: No results for input(s): INR, PROTIME in the last 168 hours. Cardiac Enzymes: No results for input(s): CKTOTAL, CKMB, CKMBINDEX, TROPONINI in the last 168 hours. BNP (last 3 results)  No results for input(s): PROBNP in the last 8760 hours. HbA1C: No results for input(s): HGBA1C in the last 72 hours. CBG: Recent Labs  Lab 09/07/17 1956 09/07/17 2348 09/08/17 0803 09/08/17 1206 09/08/17 1649  GLUCAP 106* 125* 96 114* 112*   Lipid Profile: No results for input(s): CHOL, HDL, LDLCALC, TRIG, CHOLHDL, LDLDIRECT in the last 72 hours. Thyroid Function Tests: No results for input(s): TSH, T4TOTAL, FREET4, T3FREE, THYROIDAB in the last 72 hours. Anemia Panel: No results for input(s): VITAMINB12, FOLATE, FERRITIN, TIBC, IRON, RETICCTPCT in the last 72 hours. Sepsis Labs: No results for input(s): PROCALCITON, LATICACIDVEN in the last 168 hours.  Recent Results (from the past 240 hour(s))  Gastrointestinal Panel by PCR , Stool     Status: None   Collection Time: 09/01/17  8:54 AM  Result Value Ref Range Status   Campylobacter species NOT DETECTED NOT DETECTED Final   Plesimonas shigelloides NOT DETECTED NOT DETECTED Final   Salmonella species NOT DETECTED NOT DETECTED Final   Yersinia enterocolitica NOT DETECTED NOT DETECTED Final   Vibrio species NOT DETECTED NOT DETECTED Final   Vibrio cholerae NOT DETECTED NOT DETECTED Final   Enteroaggregative E coli (EAEC) NOT DETECTED NOT DETECTED Final   Enteropathogenic E coli (EPEC) NOT DETECTED NOT DETECTED Final   Enterotoxigenic E coli (ETEC) NOT DETECTED NOT DETECTED Final   Shiga like toxin producing E coli (STEC) NOT DETECTED NOT DETECTED Final   Shigella/Enteroinvasive E coli (EIEC) NOT DETECTED NOT DETECTED Final   Cryptosporidium NOT DETECTED NOT DETECTED Final   Cyclospora cayetanensis NOT DETECTED  NOT DETECTED Final   Entamoeba histolytica NOT DETECTED NOT DETECTED Final   Giardia lamblia NOT DETECTED NOT DETECTED Final   Adenovirus F40/41 NOT DETECTED NOT DETECTED Final   Astrovirus NOT DETECTED NOT DETECTED Final   Norovirus GI/GII NOT DETECTED NOT DETECTED Final   Rotavirus A NOT DETECTED NOT DETECTED Final   Sapovirus (I, II, IV, and V) NOT DETECTED NOT DETECTED Final    Comment: Performed at [facility], [address] [place], [place] [address]  C difficile quick scan w PCR reflex     Status: None   Collection Time: 09/01/17  8:54 AM  Result Value Ref Range Status   C Diff antigen NEGATIVE NEGATIVE Final   C Diff toxin NEGATIVE NEGATIVE Final   C Diff interpretation No C. difficile detected.  Final  Culture, blood (routine x 2)     Status: None   Collection Time: 09/01/17  9:06 AM  Result Value Ref Range Status   Specimen Description BLOOD RIGHT FOREARM  Final   Special Requests   Final    BOTTLES DRAWN AEROBIC AND ANAEROBIC Blood Culture results may not be optimal due to an inadequate volume of blood received in culture bottles   Culture NO GROWTH 5 DAYS  Final   Report Status 09/06/2017 FINAL  Final  Culture, blood (routine x 2)     Status: None   Collection Time: 09/01/17 11:16 AM  Result Value Ref Range Status   Specimen Description BLOOD RIGHT ANTECUBITAL  Final   Special Requests   Final    BOTTLES DRAWN AEROBIC AND ANAEROBIC Blood Culture results may not be optimal due to an inadequate volume of blood received in culture bottles   Culture NO GROWTH 5 DAYS  Final   Report Status 09/06/2017 FINAL  Final         Radiology Studies: Ct Chest Wo Contrast  Result Date: 09/07/2017 CLINICAL DATA:  Hypoxic respiratory failure. Aspiration. Improving. Pneumonia. EXAM: CT CHEST WITHOUT CONTRAST TECHNIQUE: Multidetector CT imaging of the chest was performed following the standard protocol without IV contrast. COMPARISON:  09/02/2017 plain film. Most recent  chest CT of 02/04/2015. FINDINGS: Cardiovascular: Mild motion and EKG lead/wire artifact. Aortic atherosclerosis. Tortuous thoracic aorta. Mild cardiomegaly, without pericardial effusion. Mediastinum/Nodes: Precarinal node is similar at 10 mm image 52/series 4. Hilar regions poorly evaluated without intravenous contrast. large hiatal hernia. Lungs/Pleura: Moderate left and small right pleural effusions. Compression of the left lower lobe endobronchial tree persists. new compression of the right lower lobe endobronchial tree. Mild centrilobular emphysema. Collapse/consolidative change in both lung bases, worse on the left. Upper Abdomen: Normal imaged portions of the liver, spleen, pancreas, adrenal glands, kidneys. Musculoskeletal: Osteopenia.  Remote lower left rib fractures. IMPRESSION: 1. Multifactorial degradation. 2. Left larger than right pleural effusions. 3. Left greater than right base airspace disease. Suspicious for infection or aspiration. Especially given apparent compression/narrowing of endobronchial tree to both lower lobes, recommend radiographic follow-up until clearing and consideration of CT follow-up. 4.  Aortic Atherosclerosis (ICD10-I70.0). 5. large hiatal hernia. Electronically Signed   By: Jeronimo Greaves M.D.   On: 09/07/2017 19:57        Scheduled Meds: . diltiazem  30 mg Oral TID  . pantoprazole  40 mg Oral BID  . potassium & sodium phosphates  1 packet Oral TID WC & HS  . potassium chloride  40 mEq Oral BID   Continuous Infusions: . dextrose 5 % and 0.9% NaCl 50 mL/hr at 09/08/17 0210     LOS: 7 days    Time spent: 35 minutes.     Barnetta Chapel, MD [facility] Pager [phone]  If 7PM-7AM, please contact night-coverage [email or web address] Password [facility] 09/08/2017, 8:13 PM

## 2017-09-09 DIAGNOSIS — K449 Diaphragmatic hernia without obstruction or gangrene: Secondary | ICD-10-CM

## 2017-09-09 DIAGNOSIS — R0602 Shortness of breath: Secondary | ICD-10-CM

## 2017-09-09 LAB — RENAL FUNCTION PANEL
Albumin: 1.7 g/dL — ABNORMAL LOW (ref 3.5–5.0)
Anion gap: 7 (ref 5–15)
BUN: <5 mg/dL — ABNORMAL LOW (ref 6–20)
CO2: 26 mmol/L (ref 22–32)
Calcium: 7.7 mg/dL — ABNORMAL LOW (ref 8.9–10.3)
Chloride: 105 mmol/L (ref 101–111)
Creatinine, Ser: 0.5 mg/dL (ref 0.44–1.00)
GFR calc Af Amer: >60 mL/min (ref >60)
GFR calc non Af Amer: >60 mL/min (ref >60)
Glucose, Bld: 111 mg/dL — ABNORMAL HIGH (ref 65–99)
Phosphorus: 2.6 mg/dL (ref 2.5–4.6)
Potassium: 4 mmol/L (ref 3.5–5.1)
Sodium: 138 mmol/L (ref 135–145)

## 2017-09-09 LAB — CBC WITH DIFFERENTIAL/PLATELET
Basophils Absolute: 0 10*3/uL (ref 0.0–0.1)
Basophils Relative: 0 %
Eosinophils Absolute: 0.1 10*3/uL (ref 0.0–0.7)
Eosinophils Relative: 1 %
HCT: 34.4 % — ABNORMAL LOW (ref 36.0–46.0)
Hemoglobin: 11.1 g/dL — ABNORMAL LOW (ref 12.0–15.0)
Lymphocytes Relative: 17 %
Lymphs Abs: 2.2 10*3/uL (ref 0.7–4.0)
MCH: 29.6 pg (ref 26.0–34.0)
MCHC: 32.3 g/dL (ref 30.0–36.0)
MCV: 91.7 fL (ref 78.0–100.0)
Monocytes Absolute: 1 10*3/uL (ref 0.1–1.0)
Monocytes Relative: 8 %
Neutro Abs: 9.5 10*3/uL — ABNORMAL HIGH (ref 1.7–7.7)
Neutrophils Relative %: 74 %
Platelets: 440 10*3/uL — ABNORMAL HIGH (ref 150–400)
RBC: 3.75 MIL/uL — ABNORMAL LOW (ref 3.87–5.11)
RDW: 16.3 % — ABNORMAL HIGH (ref 11.5–15.5)
WBC: 12.7 10*3/uL — ABNORMAL HIGH (ref 4.0–10.5)

## 2017-09-09 LAB — GLUCOSE, CAPILLARY
GLUCOSE-CAPILLARY: 101 mg/dL — AB (ref 65–99)
GLUCOSE-CAPILLARY: 118 mg/dL — AB (ref 65–99)
GLUCOSE-CAPILLARY: 120 mg/dL — AB (ref 65–99)
Glucose-Capillary: 122 mg/dL — ABNORMAL HIGH (ref 65–99)
Glucose-Capillary: 121 mg/dL — ABNORMAL HIGH (ref 65–99)
Glucose-Capillary: 108 mg/dL — ABNORMAL HIGH (ref 65–99)

## 2017-09-09 LAB — BRAIN NATRIURETIC PEPTIDE: B Natriuretic Peptide: 176.7 pg/mL — ABNORMAL HIGH (ref 0.0–100.0)

## 2017-09-09 LAB — MAGNESIUM: Magnesium: 2 mg/dL (ref 1.7–2.4)

## 2017-09-09 MED ORDER — METOPROLOL TARTRATE 25 MG PO TABS
25.0000 mg | ORAL_TABLET | Freq: Two times a day (BID) | ORAL | Status: DC
  Administered 2017-09-09 – 2017-09-14 (×11): 25 mg via ORAL
  Filled 2017-09-09 (×11): qty 1
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
## 2017-09-09 NOTE — Progress Notes (Signed)
PROGRESS NOTE    Nicole Key  ZOX:[id number] DOB: [birth year] DOA: 09/01/2017 PCP: Geoffry Paradise, MD   Brief Narrative: Patient is an 84 y.o. female with past medical history significant for large hiatal hernia, incarcerated hiatal hernia with obstruction, aspiration pneumonia, hypertension, fibromyalgia, anxiety, GERD, and malnutrition. Patient was admitted with sepsis secondary to Possible PNA and colitis. GI bleed was documented, evaluated by GI who recommended supportive care. Patient has continued to be generally weak, requiring supplemental oxygen. Last ECHO was in 2014. Will proceed with ECHO (Dyspneic, weak with bilateral pleural effusion). Albumin is 1.7. Palliative was consulted for goals of care. Prognosis is guarded.  Watery stool persists according to patient. Electrolyte abnormality is corrected, but will continue to monitor. Patient insists on being discharged back home when optimized despite recommendation of brief rehab versus higher level of care. Patient understands, and voices that her prognosis is guarded, and would like to spend time with her family at home. Will plan for Home with Home Health PT/Nursing/Aide. At some point, depending on patient's progress, follow up at home may have to be transitioned to Palliative care team/Hospice.  Overall, prognosis is guarded.  Patient seen today. Continues to report diarrhea. Potassium is 4 and Phosphorus 2.6 and Albumin 1.7. Low albumin is likely suggestive of guarded prognosis versus severe malnutrition. CT Chest result noted.   Called patient's Merry Proud [phone] - Will meet with [name] in am.  Assessment & Plan:   Principal Problem:   Sepsis (HCC) Active Problems:   Meniere's disease   Anxiety   Essential hypertension   GERD (gastroesophageal reflux disease)   Hiatal hernia   AKI (acute kidney injury) (HCC)   GI bleed   Hematemesis   Melena   Hypokalemia   Leukocytosis   Elevated lactic acid  level   Intractable nausea and vomiting   Tachycardia   Palliative care by specialist   Goals of care, counseling/discussion  1-Sepsis; PNA vs colitis - Complete course of antibiotics Presents with leukocytosis , lactic acidosis.  WBC trending down. 34---12 Lactic acid decrease from 12---4.9 Completed course of antibiotics.  GI bleed, hematemesis - Stable. Hb dropped from 11 to 9.4 on admission.  Transfused  one unit PRBC 1-06 and 1-07 On Protonix.  Hemoglobin has been stable, and 11.1g/dl today.  Started on diet   Hypomagnesemia/Hypokalemia and Hypophosphatemia - Replete   Acute Encephalopathy: Resolved.  Colitis:  C diff negative.  GI pathogen negative.  Diarrhea persists Antidiarrheal agent Support care, IV fluids antibiotics,  Pursue discharge once diarrhea and electrolytes are manageable.   Acute hypoxic respiratory failure; in setting PNA. Aspiration - Improving. Guarded prognosis Chest x ray, atelectasis vs infection.  Continue with zosyn.  Incentive spirometry  Speech evaluation.    AKI; in setting of sepsis, hypovolemia. Resolved. Cr peak to 1.5. Improved with IV fluids.    HTN; tachycardia - Continue to optimize. Restart home dose of Metoprolol. BP is stable. Monitor HR and BP.  Continue Cardizem.  PRN Hydralazine.   Fibromyalgia. Appears stable at baseline.   Hypoalbuminemia: - Possible indicative of guarded prognosis versus severe malnutrition.  Bilateral effusion: Follow up CXR.  ECHO (see above)  DVT prophylaxis: SCD Code Status: Partial code.  Family Communication: grandson at bedside.  Disposition Plan: Home with Home Health.   Consultants:   GI   Palliative care consult    Procedures:   none   Antimicrobials:  Zosyn 09-01-2017 (Completed course of antibiotics)  Subjective: She is alert, she  appears weak. Diarrhea is improving. Abnormal electrolytes noted. Guarded prognosis.  Objective: Vitals:   09/08/17 1932  09/09/17 0047 09/09/17 0412 09/09/17 0800  BP: (!) 157/80 (!) 144/67 (!) 148/71 (!) 151/73  Pulse: 98 93 88 92  Resp: (!) 22 (!) 21 (!) 23 (!) 28  Temp: 98.7 F (37.1 C) 98.4 F (36.9 C) 98.6 F (37 C) 98.1 F (36.7 C)  TempSrc: Oral Oral Oral Oral  SpO2: 97% 96% 98%   Weight:   60 kg (132 lb 4.4 oz)   Height:        Intake/Output Summary (Last 24 hours) at 09/09/2017 1021 Last data filed at 09/09/2017 0701 Gross per 24 hour  Intake -  Output 500 ml  Net -500 ml   Filed Weights   09/07/17 0444 09/08/17 0322 09/09/17 0412  Weight: 42.7 kg (94 lb 2.2 oz) 55.8 kg (123 lb 0.3 oz) 60 kg (132 lb 4.4 oz)    Examination:  General exam: alert, thin appearing  Respiratory system: Decreased air entry.  Cardiovascular system:  S 1, S 2 RRR Gastrointestinal system: BS present, soft, mild tenderness.  Central nervous system: alert, answer questions/  Extremities: Mild leg edema.  Data Reviewed: I have personally reviewed following labs and imaging studies  CBC: Recent Labs  Lab 09/03/17 1407  09/04/17 0316 09/04/17 0808 09/05/17 0907 09/06/17 0344 09/08/17 0220  WBC 16.2*  --   --  13.3* 12.9* 14.1* 13.6*  NEUTROABS  --   --   --   --   --   --  9.6*  HGB 10.2*   < > 10.8* 10.9* 11.5* 10.3* 10.2*  HCT 32.6*   < > 34.4* 33.7* 37.1 32.9* 31.2*  MCV 91.3  --   --  93.9 91.6 91.4 92.0  PLT 250  --   --  275 326 324 355   < > = values in this interval not displayed.   Basic Metabolic Panel: Recent Labs  Lab 09/02/17 1526  09/04/17 0808 09/05/17 0907 09/06/17 0344 09/06/17 1601 09/07/17 0158 09/07/17 1837 09/08/17 0220 09/09/17 0352  NA  --    < > 144 140 139  --  138  --  138 138  K  --    < > 3.5 2.6* 3.1*  --  3.2*  --  3.6 4.0  CL  --    < > 114* 107 108  --  107  --  108 105  CO2  --    < > 23 26 26   --  24  --  26 26  GLUCOSE  --    < > 99 98 128*  --  120*  --  113* 111*  BUN  --    < > 10 <5* <5*  --  <5*  --  <5* <5*  CREATININE  --    < > 0.49 0.46 0.44   --  0.48  --  0.51 0.50  CALCIUM  --    < > 7.4* 7.4* 7.5*  --  7.6*  --  7.5* 7.7*  MG 1.4*  --  2.0  --   --  1.5*  --  1.9  --  2.0  PHOS  --   --   --   --   --   --  2.1* 2.7 2.5  2.3* 2.6   < > = values in this interval not displayed.   GFR: Estimated Creatinine Clearance: 46.9 mL/min (by C-G formula based on  SCr of 0.5 mg/dL). Liver Function Tests: Recent Labs  Lab 09/07/17 0158 09/08/17 0220 09/09/17 0352  ALBUMIN 1.8* 1.6* 1.7*   No results for input(s): LIPASE, AMYLASE in the last 168 hours. No results for input(s): AMMONIA in the last 168 hours. Coagulation Profile: No results for input(s): INR, PROTIME in the last 168 hours. Cardiac Enzymes: No results for input(s): CKTOTAL, CKMB, CKMBINDEX, TROPONINI in the last 168 hours. BNP (last 3 results) No results for input(s): PROBNP in the last 8760 hours. HbA1C: No results for input(s): HGBA1C in the last 72 hours. CBG: Recent Labs  Lab 09/08/17 1649 09/08/17 2035 09/09/17 0046 09/09/17 0410 09/09/17 0806  GLUCAP 112* 110* 120* 118* 122*   Lipid Profile: No results for input(s): CHOL, HDL, LDLCALC, TRIG, CHOLHDL, LDLDIRECT in the last 72 hours. Thyroid Function Tests: No results for input(s): TSH, T4TOTAL, FREET4, T3FREE, THYROIDAB in the last 72 hours. Anemia Panel: No results for input(s): VITAMINB12, FOLATE, FERRITIN, TIBC, IRON, RETICCTPCT in the last 72 hours. Sepsis Labs: No results for input(s): PROCALCITON, LATICACIDVEN in the last 168 hours.  Recent Results (from the past 240 hour(s))  Gastrointestinal Panel by PCR , Stool     Status: None   Collection Time: 09/01/17  8:54 AM  Result Value Ref Range Status   Campylobacter species NOT DETECTED NOT DETECTED Final   Plesimonas shigelloides NOT DETECTED NOT DETECTED Final   Salmonella species NOT DETECTED NOT DETECTED Final   Yersinia enterocolitica NOT DETECTED NOT DETECTED Final   Vibrio species NOT DETECTED NOT DETECTED Final   Vibrio cholerae NOT  DETECTED NOT DETECTED Final   Enteroaggregative E coli (EAEC) NOT DETECTED NOT DETECTED Final   Enteropathogenic E coli (EPEC) NOT DETECTED NOT DETECTED Final   Enterotoxigenic E coli (ETEC) NOT DETECTED NOT DETECTED Final   Shiga like toxin producing E coli (STEC) NOT DETECTED NOT DETECTED Final   Shigella/Enteroinvasive E coli (EIEC) NOT DETECTED NOT DETECTED Final   Cryptosporidium NOT DETECTED NOT DETECTED Final   Cyclospora cayetanensis NOT DETECTED NOT DETECTED Final   Entamoeba histolytica NOT DETECTED NOT DETECTED Final   Giardia lamblia NOT DETECTED NOT DETECTED Final   Adenovirus F40/41 NOT DETECTED NOT DETECTED Final   Astrovirus NOT DETECTED NOT DETECTED Final   Norovirus GI/GII NOT DETECTED NOT DETECTED Final   Rotavirus A NOT DETECTED NOT DETECTED Final   Sapovirus (I, II, IV, and V) NOT DETECTED NOT DETECTED Final    Comment: Performed at [facility], [address] [place], [place] [address]  C difficile quick scan w PCR reflex     Status: None   Collection Time: 09/01/17  8:54 AM  Result Value Ref Range Status   C Diff antigen NEGATIVE NEGATIVE Final   C Diff toxin NEGATIVE NEGATIVE Final   C Diff interpretation No C. difficile detected.  Final  Culture, blood (routine x 2)     Status: None   Collection Time: 09/01/17  9:06 AM  Result Value Ref Range Status   Specimen Description BLOOD RIGHT FOREARM  Final   Special Requests   Final    BOTTLES DRAWN AEROBIC AND ANAEROBIC Blood Culture results may not be optimal due to an inadequate volume of blood received in culture bottles   Culture NO GROWTH 5 DAYS  Final   Report Status 09/06/2017 FINAL  Final  Culture, blood (routine x 2)     Status: None   Collection Time: 09/01/17 11:16 AM  Result Value Ref Range Status  Specimen Description BLOOD RIGHT ANTECUBITAL  Final   Special Requests   Final    BOTTLES DRAWN AEROBIC AND ANAEROBIC Blood Culture results may not be optimal due to an inadequate volume of  blood received in culture bottles   Culture NO GROWTH 5 DAYS  Final   Report Status 09/06/2017 FINAL  Final         Radiology Studies: Ct Chest Wo Contrast  Result Date: 09/07/2017 CLINICAL DATA:  Hypoxic respiratory failure. Aspiration. Improving. Pneumonia. EXAM: CT CHEST WITHOUT CONTRAST TECHNIQUE: Multidetector CT imaging of the chest was performed following the standard protocol without IV contrast. COMPARISON:  09/02/2017 plain film. Most recent chest CT of 02/04/2015. FINDINGS: Cardiovascular: Mild motion and EKG lead/wire artifact. Aortic atherosclerosis. Tortuous thoracic aorta. Mild cardiomegaly, without pericardial effusion. Mediastinum/Nodes: Precarinal node is similar at 10 mm image 52/series 4. Hilar regions poorly evaluated without intravenous contrast. large hiatal hernia. Lungs/Pleura: Moderate left and small right pleural effusions. Compression of the left lower lobe endobronchial tree persists. new compression of the right lower lobe endobronchial tree. Mild centrilobular emphysema. Collapse/consolidative change in both lung bases, worse on the left. Upper Abdomen: Normal imaged portions of the liver, spleen, pancreas, adrenal glands, kidneys. Musculoskeletal: Osteopenia.  Remote lower left rib fractures. IMPRESSION: 1. Multifactorial degradation. 2. Left larger than right pleural effusions. 3. Left greater than right base airspace disease. Suspicious for infection or aspiration. Especially given apparent compression/narrowing of endobronchial tree to both lower lobes, recommend radiographic follow-up until clearing and consideration of CT follow-up. 4.  Aortic Atherosclerosis (ICD10-I70.0). 5. large hiatal hernia. Electronically Signed   By: Jeronimo Greaves M.D.   On: 09/07/2017 19:57        Scheduled Meds: . diltiazem  30 mg Oral TID  . pantoprazole  40 mg Oral BID  . potassium & sodium phosphates  1 packet Oral TID WC & HS   Continuous Infusions: . dextrose 5 % and 0.9%  NaCl 50 mL/hr at 09/08/17 2131     LOS: 8 days    Time spent: 35 minutes.     Barnetta Chapel, MD [facility] Pager [phone]  If 7PM-7AM, please contact night-coverage [email or web address] Password [facility] 09/09/2017, 10:21 AM

## 2017-09-10 ENCOUNTER — Inpatient Hospital Stay (HOSPITAL_COMMUNITY): Payer: Medicare Other

## 2017-09-10 ENCOUNTER — Encounter (HOSPITAL_COMMUNITY): Payer: Self-pay | Admitting: General Surgery

## 2017-09-10 DIAGNOSIS — N179 Acute kidney failure, unspecified: Secondary | ICD-10-CM

## 2017-09-10 DIAGNOSIS — A419 Sepsis, unspecified organism: Principal | ICD-10-CM

## 2017-09-10 DIAGNOSIS — R7989 Other specified abnormal findings of blood chemistry: Secondary | ICD-10-CM

## 2017-09-10 DIAGNOSIS — R0600 Dyspnea, unspecified: Secondary | ICD-10-CM

## 2017-09-10 DIAGNOSIS — F419 Anxiety disorder, unspecified: Secondary | ICD-10-CM

## 2017-09-10 HISTORY — PX: IR THORACENTESIS ASP PLEURAL SPACE W/IMG GUIDE: IMG5380

## 2017-09-10 LAB — ECHOCARDIOGRAM COMPLETE
E decel time: 232 msec
E/e' ratio: 9.78
FS: 48 % — AB (ref 28–44)
Height: 68 in
IVS/LV PW RATIO, ED: 0.87
LA ID, A-P, ES: 31 mm
LA diam end sys: 31 mm
LA diam index: 1.93 cm/m2
LV E/e' medial: 9.78
LV E/e'average: 9.78
LV PW d: 8.52 mm — AB (ref 0.6–1.1)
LV e' LATERAL: 6.96 cm/s
MV Dec: 232
MV pk A vel: 88.3 m/s
MV pk E vel: 68.1 m/s
RV sys press: 35 mmHg
Reg peak vel: 282 cm/s
TDI e' lateral: 6.96
TDI e' medial: 5.22
TR max vel: 282 cm/s
Weight: 1823.65 oz

## 2017-09-10 LAB — COMPREHENSIVE METABOLIC PANEL
ALBUMIN: 1.9 g/dL — AB (ref 3.5–5.0)
ALK PHOS: 57 U/L (ref 38–126)
ALT: 11 U/L — ABNORMAL LOW (ref 14–54)
ANION GAP: 9 (ref 5–15)
AST: 18 U/L (ref 15–41)
BILIRUBIN TOTAL: 0.4 mg/dL (ref 0.3–1.2)
BUN: 5 mg/dL — ABNORMAL LOW (ref 6–20)
CALCIUM: 7.9 mg/dL — AB (ref 8.9–10.3)
CO2: 25 mmol/L (ref 22–32)
Chloride: 103 mmol/L (ref 101–111)
Creatinine, Ser: 0.59 mg/dL (ref 0.44–1.00)
GFR calc Af Amer: >60 mL/min (ref >60)
Glucose, Bld: 131 mg/dL — ABNORMAL HIGH (ref 65–99)
Potassium: 3.6 mmol/L (ref 3.5–5.1)
Sodium: 137 mmol/L (ref 135–145)
TOTAL PROTEIN: 5.2 g/dL — AB (ref 6.5–8.1)

## 2017-09-10 LAB — LACTATE DEHYDROGENASE, PLEURAL OR PERITONEAL FLUID: LD FL: 69 U/L — AB (ref 3–23)

## 2017-09-10 LAB — BODY FLUID CELL COUNT WITH DIFFERENTIAL
Lymphs, Fluid: 71 %
MONOCYTE-MACROPHAGE-SEROUS FLUID: 16 % — AB (ref 50–90)
Neutrophil Count, Fluid: 13 % (ref 0–25)
WBC FLUID: 1080 uL — AB (ref 0–1000)

## 2017-09-10 LAB — GLUCOSE, CAPILLARY
GLUCOSE-CAPILLARY: 129 mg/dL — AB (ref 65–99)
GLUCOSE-CAPILLARY: 151 mg/dL — AB (ref 65–99)
Glucose-Capillary: 121 mg/dL — ABNORMAL HIGH (ref 65–99)
Glucose-Capillary: 124 mg/dL — ABNORMAL HIGH (ref 65–99)

## 2017-09-10 LAB — GRAM STAIN

## 2017-09-10 LAB — PROTEIN, PLEURAL OR PERITONEAL FLUID

## 2017-09-10 LAB — LACTATE DEHYDROGENASE: LDH: 118 U/L (ref 98–192)

## 2017-09-10 MED ORDER — LIDOCAINE 2% (20 MG/ML) 5 ML SYRINGE
INTRAMUSCULAR | Status: AC
  Filled 2017-09-10: qty 10

## 2017-09-10 MED ORDER — LIDOCAINE HCL (PF) 1 % IJ SOLN
INTRAMUSCULAR | Status: DC | PRN
  Administered 2017-09-10: 5 mL

## 2017-09-10 MED ORDER — LOPERAMIDE HCL 2 MG PO CAPS
2.0000 mg | ORAL_CAPSULE | ORAL | Status: DC | PRN
  Administered 2017-09-10: 2 mg via ORAL
  Filled 2017-09-10: qty 1

## 2017-09-10 NOTE — Care Management Important Message (Signed)
Important Message  Patient Details  Name: Nicole Key MRN: [id number] Date of Birth: December 12, 1927   Medicare Important Message Given:  Yes    [name] 09/10/2017, 4:05 PM

## 2017-09-10 NOTE — Progress Notes (Signed)
PT Cancellation Note  Patient Details Name: Angelique BlonderDorothy L Madrigal MRN: [id number] DOB: 09/11/1927   Cancelled Treatment:    Reason Eval/Treat Not Completed: Patient at procedure or test/unavailable. Pt going to radiology.   Angelina OkCary W [facility] 09/10/2017, 9:33 AM Skip Mayerary Bobbie Virden PT [phone]

## 2017-09-10 NOTE — Progress Notes (Signed)
  Echocardiogram 2D Echocardiogram has been performed.  Nicole Key, Nicole Key 09/10/2017, 10:27 AM

## 2017-09-10 NOTE — Procedures (Signed)
Ultrasound-guided diagnostic and therapeutic left thoracentesis performed yielding 0.2 liters of serous colored fluid. No immediate complications. Follow-up chest x-ray pending.   Nicole Key Ezekiel 3:30 PM 09/10/2017

## 2017-09-10 NOTE — Progress Notes (Signed)
PT Cancellation Note  Patient Details Name: Nicole Key MRN: [id number] DOB: 01-09-1928   Cancelled Treatment:    Reason Eval/Treat Not Completed: Other (comment). Pt prefers to rest at this time and plans to return home with family tomorrow. Pt confident that family will be comfortable and able to provide any assistance she needs. Agree with plan to return home with family.   Nicole Key [facility] 09/10/2017, 11:36 AM Skip Mayerary Crewe Heathman PT [phone]

## 2017-09-10 NOTE — Progress Notes (Signed)
[facility] PROGRESS NOTE  Nicole Key ZOX:[id number] DOB: 06/29/1928 DOA: 09/01/2017 PCP: Geoffry Paradise, MD  Brief summary   84 y.o.femalewith past medical history significantforlarge hiatal hernia, incarcerated hiatal hernia with obstruction, aspiration pneumonia, hypertension, fibromyalgia, anxiety, GERD, and malnutrition. Patient was admitted with sepsis secondary to Possible PNA and colitis. GI bleed was documented, evaluated by GI who recommended supportive care. Patient has continued to be generally weak, requiring supplemental oxygen. Last ECHO was in 2014.  Patient insists on being discharged back home when optimized despite recommendation of brief rehab versus higher level of care. Patient understands, and voices that her prognosis is guarded, and would like to spend time with her family at home. plan for Home with Home Health PT/Nursing/Aide. At some point, depending on patient's progress, follow up at home may have to be transitioned to Palliative care team/Hospice.  Overall, prognosis is guarded.   Assessment/Plan:  Sepsis; PNA vs colitis. Presents with leukocytosis , lactic acidosis. WBC trending down. 34---12.  Lactic acid decrease from 12---4.9 -improved. Completed course of antibiotics.  GI bleed, hematemesis. Transfused  one unit PRBC 1-06 and 1-07. On Protonix.  -Hg remains stable   Hypomagnesemia/Hypokalemia and Hypophosphatemia - Repleted   Acute Encephalopathy: Resolved.  Colitis: C diff negative. GI pathogen negative. Antidiarrheal agent, Support care, IV fluids antibiotics,  -Improved   Acute hypoxic respiratory failure; in setting PNA. Aspiration. Guarded prognosis -treated with zosyn. Blood culture: NGTD.  Incentive spirometry. Improved  -large symptomatic pleural effusion on CT. D/w patient, her family. Will try to obtain US thoracentesis. F/u echo    AKI; in setting of sepsis, hypovolemia. Cr peak to 1.5. Improved with IV fluids.    HTN; Restart home meds. PRN Hydralazine.   Fibromyalgia. Appears stable at baseline.   Hypoalbuminemia: Possible indicative of guarded prognosis versus severe malnutrition.   Code Status: partial  Family Communication: d/w patient, her family. RN (indicate person spoken with, relationship, and if by phone, the number) Disposition Plan: home HHC. Tomorrow    Consultants:   GI   Palliative care consult    Procedures:  US thoracentesis    Antimicrobials:  Zosyn 09-01-2017     HPI/Subjective: Alert. Reports feeling better. D/w her family at the bedside. Will try to obtain therapeutic thoracentesis today   Objective: Vitals:   09/10/17 0000 09/10/17 0404  BP: (!) 153/71 129/67  Pulse: 68 86  Resp: 20 (!) 21  Temp: 97.7 F (36.5 C) 98.3 F (36.8 C)  SpO2: 97% 94%    Intake/Output Summary (Last 24 hours) at 09/10/2017 0754 Last data filed at 09/10/2017 0700 Gross per 24 hour  Intake 510 ml  Output 1850 ml  Net -1340 ml   Filed Weights   09/08/17 0322 09/09/17 0412 09/10/17 0404  Weight: 55.8 kg (123 lb 0.3 oz) 60 kg (132 lb 4.4 oz) 51.7 kg (113 lb 15.7 oz)    Exam:   General:  Alert. No distress   Cardiovascular: s1,s2 rrr  Respiratory: diminished LLL  Abdomen: soft, nt, nd   Musculoskeletal: no leg edema    Data Reviewed: Basic Metabolic Panel: Recent Labs  Lab 09/04/17 0808 09/05/17 0907 09/06/17 0344 09/06/17 1601 09/07/17 0158 09/07/17 1837 09/08/17 0220 09/09/17 0352  NA 144 140 139  --  138  --  138 138  K 3.5 2.6* 3.1*  --  3.2*  --  3.6 4.0  CL 114* 107 108  --  107  --  108 105  CO2 23 26 26   --  24  --  26 26  GLUCOSE 99 98 128*  --  120*  --  113* 111*  BUN 10 <5* <5*  --  <5*  --  <5* <5*  CREATININE 0.49 0.46 0.44  --  0.48  --  0.51 0.50  CALCIUM 7.4* 7.4* 7.5*  --  7.6*  --  7.5* 7.7*  MG 2.0  --   --  1.5*  --  1.9  --  2.0  PHOS  --   --   --   --  2.1* 2.7 2.5  2.3* 2.6   Liver Function Tests: Recent Labs   Lab 09/07/17 0158 09/08/17 0220 09/09/17 0352  ALBUMIN 1.8* 1.6* 1.7*   No results for input(s): LIPASE, AMYLASE in the last 168 hours. No results for input(s): AMMONIA in the last 168 hours. CBC: Recent Labs  Lab 09/04/17 0808 09/05/17 0907 09/06/17 0344 09/08/17 0220 09/09/17 1119  WBC 13.3* 12.9* 14.1* 13.6* 12.7*  NEUTROABS  --   --   --  9.6* 9.5*  HGB 10.9* 11.5* 10.3* 10.2* 11.1*  HCT 33.7* 37.1 32.9* 31.2* 34.4*  MCV 93.9 91.6 91.4 92.0 91.7  PLT 275 326 324 355 440*   Cardiac Enzymes: No results for input(s): CKTOTAL, CKMB, CKMBINDEX, TROPONINI in the last 168 hours. BNP (last 3 results) Recent Labs    09/01/17 1807 09/09/17 1119  BNP 275.0* 176.7*    ProBNP (last 3 results) No results for input(s): PROBNP in the last 8760 hours.  CBG: Recent Labs  Lab 09/09/17 1140 09/09/17 1620 09/09/17 2004 09/10/17 0013 09/10/17 0403  GLUCAP 101* 121* 108* 124* 129*    Recent Results (from the past 240 hour(s))  Gastrointestinal Panel by PCR , Stool     Status: None   Collection Time: 09/01/17  8:54 AM  Result Value Ref Range Status   Campylobacter species NOT DETECTED NOT DETECTED Final   Plesimonas shigelloides NOT DETECTED NOT DETECTED Final   Salmonella species NOT DETECTED NOT DETECTED Final   Yersinia enterocolitica NOT DETECTED NOT DETECTED Final   Vibrio species NOT DETECTED NOT DETECTED Final   Vibrio cholerae NOT DETECTED NOT DETECTED Final   Enteroaggregative E coli (EAEC) NOT DETECTED NOT DETECTED Final   Enteropathogenic E coli (EPEC) NOT DETECTED NOT DETECTED Final   Enterotoxigenic E coli (ETEC) NOT DETECTED NOT DETECTED Final   Shiga like toxin producing E coli (STEC) NOT DETECTED NOT DETECTED Final   Shigella/Enteroinvasive E coli (EIEC) NOT DETECTED NOT DETECTED Final   Cryptosporidium NOT DETECTED NOT DETECTED Final   Cyclospora cayetanensis NOT DETECTED NOT DETECTED Final   Entamoeba histolytica NOT DETECTED NOT DETECTED Final    Giardia lamblia NOT DETECTED NOT DETECTED Final   Adenovirus F40/41 NOT DETECTED NOT DETECTED Final   Astrovirus NOT DETECTED NOT DETECTED Final   Norovirus GI/GII NOT DETECTED NOT DETECTED Final   Rotavirus A NOT DETECTED NOT DETECTED Final   Sapovirus (I, II, IV, and V) NOT DETECTED NOT DETECTED Final    Comment: Performed at [facility], [address] [place], [place] [address]  C difficile quick scan w PCR reflex     Status: None   Collection Time: 09/01/17  8:54 AM  Result Value Ref Range Status   C Diff antigen NEGATIVE NEGATIVE Final   C Diff toxin NEGATIVE NEGATIVE Final   C Diff interpretation No C. difficile detected.  Final  Culture, blood (routine x 2)     Status: None   Collection  Time: 09/01/17  9:06 AM  Result Value Ref Range Status   Specimen Description BLOOD RIGHT FOREARM  Final   Special Requests   Final    BOTTLES DRAWN AEROBIC AND ANAEROBIC Blood Culture results may not be optimal due to an inadequate volume of blood received in culture bottles   Culture NO GROWTH 5 DAYS  Final   Report Status 09/06/2017 FINAL  Final  Culture, blood (routine x 2)     Status: None   Collection Time: 09/01/17 11:16 AM  Result Value Ref Range Status   Specimen Description BLOOD RIGHT ANTECUBITAL  Final   Special Requests   Final    BOTTLES DRAWN AEROBIC AND ANAEROBIC Blood Culture results may not be optimal due to an inadequate volume of blood received in culture bottles   Culture NO GROWTH 5 DAYS  Final   Report Status 09/06/2017 FINAL  Final     Studies: No results found.  Scheduled Meds: . diltiazem  30 mg Oral TID  . metoprolol tartrate  25 mg Oral BID  . pantoprazole  40 mg Oral BID   Continuous Infusions: . dextrose 5 % and 0.9% NaCl 50 mL/hr at 09/09/17 2158    Principal Problem:   Sepsis (HCC) Active Problems:   Meniere's disease   Anxiety   Essential hypertension   GERD (gastroesophageal reflux disease)   Hiatal hernia   AKI (acute kidney  injury) (HCC)   GI bleed   Hematemesis   Melena   Hypokalemia   Leukocytosis   Elevated lactic acid level   Intractable nausea and vomiting   Tachycardia   Palliative care by specialist   Goals of care, counseling/discussion    Time spent: >35 minutes     Esperanza Sheets  [facility] Pager [phone]. If 7PM-7AM, please contact night-coverage at [email or web address], password [facility] 09/10/2017, 7:54 AM  LOS: 9 days

## 2017-09-10 NOTE — Progress Notes (Signed)
OT Cancellation Note  Patient Details Name: Nicole Key MRN: [id number] DOB: 01/29/1928   Cancelled Treatment:    Reason Eval/Treat Not Completed: Patient at procedure or test/ unavailable(IR)  Evern BioMayberry, Elliott Lasecki Lynn 09/10/2017, 2:57 PM  09/10/2017 Martie RoundJulie Vernor Monnig, OTR/L Pager: [phone]

## 2017-09-11 DIAGNOSIS — K922 Gastrointestinal hemorrhage, unspecified: Secondary | ICD-10-CM

## 2017-09-11 DIAGNOSIS — E46 Unspecified protein-calorie malnutrition: Secondary | ICD-10-CM

## 2017-09-11 DIAGNOSIS — Z9189 Other specified personal risk factors, not elsewhere classified: Secondary | ICD-10-CM

## 2017-09-11 DIAGNOSIS — J90 Pleural effusion, not elsewhere classified: Secondary | ICD-10-CM

## 2017-09-11 LAB — CBC
HCT: 32.5 % — ABNORMAL LOW (ref 36.0–46.0)
Hemoglobin: 10.8 g/dL — ABNORMAL LOW (ref 12.0–15.0)
MCH: 30.6 pg (ref 26.0–34.0)
MCHC: 33.2 g/dL (ref 30.0–36.0)
MCV: 92.1 fL (ref 78.0–100.0)
Platelets: 466 10*3/uL — ABNORMAL HIGH (ref 150–400)
RBC: 3.53 MIL/uL — ABNORMAL LOW (ref 3.87–5.11)
RDW: 16.2 % — AB (ref 11.5–15.5)
WBC: 14 10*3/uL — ABNORMAL HIGH (ref 4.0–10.5)

## 2017-09-11 MED ORDER — ENSURE ENLIVE PO LIQD
237.0000 mL | Freq: Two times a day (BID) | ORAL | Status: DC
  Administered 2017-09-11 – 2017-09-12 (×3): 237 mL via ORAL

## 2017-09-11 NOTE — Progress Notes (Signed)
Daily Progress Note   Patient Name: Nicole Key       Date: 09/11/2017 DOB: [birth year]  Age: 84 y.o. MRN#: [id number] Attending Physician: Esperanza Sheets, MD Primary Care Physician: Geoffry Paradise, MD Admit Date: 09/01/2017  Reason for Consultation/Follow-up: Establishing goals of care  Subjective/GOC: Patient awake, alert, oriented. C/o of back pain for which RN recently gave prn medication. Continues to feel weak. Sat in chair for a Heizer time today.   Niece, Nicole Key at bedside. Introduced palliative medicine. Reviewed course of hospitalization, diagnoses, and interventions with patient and niece. She again tells me (like last week) that she is "tired" and does not want intense physical therapy. Patient and family have declined SNF for rehab. Patient confirms that grandson, Nicole Key is very supportive and will do anything possible to keep her home. Nicole Key is big support for her and plans to be readily available. Nicole Key has hired caregivers also. [name] tells me she wants to "die at home."   Discussed plan for discharge home with home health. Educated on outpatient palliative versus hospice options. [name] was not ready for hospice services during our meeting last week. Today she (along with Nicole Key) is asking more questions regarding hospice. Educated on hospice philosophy including focus on comfort, quality, and dignity at EOL. Educated on symptom management focus and prevention of re-hospitalization, if her goal is to be home. Again, [name] tells me she is tired and knows her "limitations" in regards to physical therapy.   I encouraged [name] and Nicole Key to discuss with [employer]. I reassured her that I am not here to rush any decision but wanted her to know options if she is  interested. Emotional and spiritual support provided.    Length of Stay: 10  Current Medications: Scheduled Meds:  . diltiazem  30 mg Oral TID  . feeding supplement (ENSURE ENLIVE)  237 mL Oral BID BM  . metoprolol tartrate  25 mg Oral BID  . pantoprazole  40 mg Oral BID    Continuous Infusions:   PRN Meds: acetaminophen **OR** acetaminophen, albuterol, hydrALAZINE, lidocaine (PF), loperamide, Melatonin, morphine injection  Physical Exam  Constitutional: She is oriented to person, place, and time. She appears cachectic. She is cooperative. She appears ill.  HENT:  Head: Normocephalic and atraumatic.  Cardiovascular: Regular rhythm.  Pulmonary/Chest: No accessory muscle usage. No tachypnea. No respiratory distress.  On RA  Abdominal: Normal appearance.  Neurological: She is alert and oriented to person, place, and time.  Skin: Skin is warm and dry. There is pallor.  Psychiatric: She has a normal mood and affect. Her speech is normal and behavior is normal. Cognition and memory are normal.  Nursing note and vitals reviewed.          Vital Signs: BP 116/60 (BP Location: Right Arm)   Pulse 93   Temp 97.9 F (36.6 C) (Oral)   Resp 20   Ht 5\' 8"  (1.727 m)   Wt 51.9 kg (114 lb 6.7 oz)   SpO2 94%   BMI 17.40 kg/m  SpO2: SpO2: 94 % O2 Device: O2 Device: Not Delivered O2 Flow Rate: O2 Flow Rate (L/min): 2 L/min  Intake/output summary:   Intake/Output Summary (Last 24 hours) at 09/11/2017 1638 Last data filed at 09/11/2017 0507 Gross per 24 hour  Intake -  Output 1375 ml  Net -1375 ml   LBM: Last BM Date: 09/10/17 Baseline Weight: Weight: 44.5 kg (98 lb) Most recent weight: Weight: 51.9 kg (114 lb 6.7 oz)   Palliative Assessment/Data: PPS 40%   Flowsheet Rows     Most Recent Value  Intake Tab  Referral Department  Hospitalist  Unit at Time of Referral  Cardiac/Telemetry Unit  Palliative Care Primary Diagnosis  Sepsis/Infectious Disease  Date Notified  09/03/17    Palliative Care Type  New Palliative care  Reason for referral  Clarify Goals of Care  Date of Admission  09/01/17  Date first seen by Palliative Care  09/04/17  # of days Palliative referral response time  1 Day(s)  # of days IP prior to Palliative referral  2  Clinical Assessment  Palliative Performance Scale Score  40%  Psychosocial & Spiritual Assessment  Palliative Care Outcomes  Patient/Family meeting held?  Yes  Who was at the meeting?  patient and niece  Palliative Care Outcomes  Clarified goals of care, Provided end of life care assistance, Provided psychosocial or spiritual support, ACP counseling assistance, Counseled regarding hospice      Patient Active Problem List   Diagnosis Date Noted  . Palliative care by specialist   . Goals of care, counseling/discussion   . GI bleed 09/01/2017  . Hematemesis 09/01/2017  . Melena 09/01/2017  . Hypokalemia 09/01/2017  . Leukocytosis 09/01/2017  . Elevated lactic acid level 09/01/2017  . Intractable nausea and vomiting 09/01/2017  . Tachycardia 09/01/2017  . Dysphagia   . AKI (acute kidney injury) (HCC) 03/09/2017  . Severely underweight adult 03/09/2017  . Abnormal weight loss 03/09/2017  . Cerebrovascular disease 03/09/2017  . Globus sensation 03/09/2017  . Chest pain, rule out acute myocardial infarction 03/08/2017  . Hiatal hernia 02/07/2015  . Sepsis (HCC) 02/04/2015  . Anxiety 02/04/2015  . Essential hypertension 02/04/2015  . GERD (gastroesophageal reflux disease) 02/04/2015  . Protein-calorie malnutrition, severe (HCC) 02/04/2015  . Unstable gait 08/15/2013  . Depression 08/15/2013  . Osteoporosis 07/18/2011  . Meniere's disease 07/18/2011    Palliative Care Assessment & Plan   Patient Profile: 84 y.o. female  with past medical history of incarcerated hiatal hernia with obstruction in 2016, Meniere's disease, syncope, HTN, GERD, fibromyalgia, aspiration pneumonia, arthritis, anxiety, and malnutrition  admitted on 09/01/2017 with nausea, vomiting, abdominal pain, and dark stools. In ED, patient with elevated lactic acid, hypokalemia, leukocytosis, tachycardia, and acute kidney injury. Sepsis and acute respiratory failure  secondary to possible pneumonia and colitis. Receiving IV antibiotics and IVF. GI evaluated but patient was not stable for EGD. Continue with supportive care. HgB stable and GI has signed off. PT/OT/SLP consulted. Palliative medicine consultation for goals of care.   Assessment: Sepsis Pneumonia Colitis Acute hypoxic respiratory failure Pleural effusion s/p thoracentesis 1/14 High risk for aspiration AKI Hypoalbuminemia  Recommendations/Plan:  Continue current interventions.   Follow-up discussion with patient/niece. Educated on palliative/hospice outpatient options. Patient is now considering going home with hospice services but would like to discuss with grandson tonight. She tells me she wants to be at home at EOL.   PMT will f/u in AM.   Goals of Care and Additional Recommendations:  No resuscitation/life support. Continue medical management  Code Status: Limited   Code Status Orders  (From admission, onward)        Start     Ordered   09/01/17 1150  Limited resuscitation (code)  Continuous    Question Answer Comment  In the event of cardiac or respiratory ARREST: Initiate Code Blue, Call Rapid Response Yes   In the event of cardiac or respiratory ARREST: Perform CPR No   In the event of cardiac or respiratory ARREST: Perform Intubation/Mechanical Ventilation No   In the event of cardiac or respiratory ARREST: Use NIPPV/BiPAp only if indicated Yes   In the event of cardiac or respiratory ARREST: Administer ACLS medications if indicated Yes   In the event of cardiac or respiratory ARREST: Perform Defibrillation or Cardioversion if indicated Yes      09/01/17 1153    Code Status History    Date Active Date Inactive Code Status Order ID Comments User  Context   03/08/2017 21:09 03/10/2017 17:17 Full Code [id number]  Hillary BowGardner, Jared M, DO ED   10/18/2016 15:36 10/19/2016 21:10 Full Code [id number]  Gwenyth BenderBlack, Karen M, NP ED   02/04/2015 05:23 02/09/2015 18:56 DNR [id number]  Lorretta HarpNiu, Xilin, MD ED    Advance Directive Documentation     Most Recent Value  Type of Advance Directive  Healthcare Power of Attorney  Pre-existing out of facility DNR order (yellow form or pink MOST form)  No data  "MOST" Form in Place?  No data       Prognosis:   Unable to determine: guarded  Discharge Planning:  To Be Determined patient/family now considering home with hospice  Care plan was discussed with RN, patient, niece Nicole Key(Nicole Key) at bedside  Thank you for allowing the Palliative Medicine Team to assist in the care of this patient.   Time In: 1545 Time Out: 1625 Total Time 40min Prolonged Time Billed  no      Greater than 50%  of this time was spent counseling and coordinating care related to the above assessment and plan.  Vennie HomansMegan Caretha Rumbaugh, FNP-C Palliative Medicine Team  Phone: [phone] Fax: [phone]  Please contact Palliative Medicine Team phone at [phone] for questions and concerns.

## 2017-09-11 NOTE — Progress Notes (Addendum)
[facility] PROGRESS NOTE  Nicole Key:[id number] DOB: Aug 21, 1928 DOA: 09/01/2017 PCP: Geoffry Paradise, MD  Brief summary   84 y.o.femalewith past medical history significantforlarge hiatal hernia, incarcerated hiatal hernia with obstruction, aspiration pneumonia, hypertension, fibromyalgia, anxiety, GERD, and malnutrition. Patient was admitted with sepsis secondary to Possible PNA and colitis. GI bleed was documented, evaluated by GI who recommended supportive care. Patient has continued to be generally weak, requiring supplemental oxygen. Last ECHO was in 2014.  Patient insists on being discharged back home when optimized despite recommendation of brief rehab versus higher level of care. Patient understands, and voices that her prognosis is guarded, and would like to spend time with her family at home. plan for Home with Home Health PT/Nursing/Aide. At some point, depending on patient's progress, follow up at home may have to be transitioned to Palliative care team/Hospice.  Overall, prognosis is guarded. Remains severely deconditioned    Assessment/Plan:  Sepsis; PNA vs colitis. Presents with leukocytosis , lactic acidosis. WBC trending down. 34---12.  Lactic acid decrease from 12---4.9 -improved. Completed course of antibiotics.  Acute hypoxic respiratory failure; in setting PNA. Aspiration. Guarded prognosis -treated with zosyn. Blood culture: NGTD.  Incentive spirometry. Improved. Echo: LVEF 60% -large symptomatic pleural effusion on CT. Underwent US thoracentesis on 11/14. Gram stain is negative, preilim cultures: NGTD -will try to wean from oxygen today  GI bleed, hematemesis. Transfused  one unit PRBC 1-06 and 1-07. On Protonix.  -Hg remains stable   Hypomagnesemia/Hypokalemia and Hypophosphatemia - Repleted   Acute Encephalopathy: Resolved.  Colitis: C diff negative. GI pathogen negative. Antidiarrheal agent, Support care, IV fluids antibiotics,   -Improved   AKI; in setting of sepsis, hypovolemia. Cr peak to 1.5. Improved with IV fluids.   HTN; Restart home meds. PRN Hydralazine.   Fibromyalgia. Appears stable at baseline.   Hypoalbuminemia: Possible indicative of guarded prognosis versus severe malnutrition.  Prognosis is guarded, patient with severe deconditioning. Refused to go to SNF.   Code Status: partial  Family Communication: d/w patient, her family. RN (indicate person spoken with, relationship, and if by phone, the number) Disposition Plan: home HHC. Possible 24-48 hrs    Consultants:   GI   Palliative care consult    Procedures:  US thoracentesis    Antimicrobials:  Zosyn 09-01-2017     HPI/Subjective: Alert. Reports feeling better. D/w her family at the bedside. Will try to obtain therapeutic thoracentesis today   Objective: Vitals:   09/11/17 0959 09/11/17 1006  BP: (!) 156/78 140/82  Pulse:    Resp: (!) 27   Temp:    SpO2: 93%     Intake/Output Summary (Last 24 hours) at 09/11/2017 1156 Last data filed at 09/11/2017 0507 Gross per 24 hour  Intake -  Output 1575 ml  Net -1575 ml   Filed Weights   09/09/17 0412 09/10/17 0404 09/11/17 0454  Weight: 60 kg (132 lb 4.4 oz) 51.7 kg (113 lb 15.7 oz) 51.9 kg (114 lb 6.7 oz)    Exam:   General:  Alert. No distress   Cardiovascular: s1,s2 rrr  Respiratory: diminished LLL  Abdomen: soft, nt, nd   Musculoskeletal: no leg edema    Data Reviewed: Basic Metabolic Panel: Recent Labs  Lab 09/06/17 0344 09/06/17 1601 09/07/17 0158 09/07/17 1837 09/08/17 0220 09/09/17 0352 09/10/17 1835  NA 139  --  138  --  138 138 137  K 3.1*  --  3.2*  --  3.6 4.0 3.6  CL 108  --  107  --  108 105 103  CO2 26  --  24  --  26 26 25   GLUCOSE 128*  --  120*  --  113* 111* 131*  BUN <5*  --  <5*  --  <5* <5* 5*  CREATININE 0.44  --  0.48  --  0.51 0.50 0.59  CALCIUM 7.5*  --  7.6*  --  7.5* 7.7* 7.9*  MG  --  1.5*  --  1.9  --  2.0  --    PHOS  --   --  2.1* 2.7 2.5  2.3* 2.6  --    Liver Function Tests: Recent Labs  Lab 09/07/17 0158 09/08/17 0220 09/09/17 0352 09/10/17 1835  AST  --   --   --  18  ALT  --   --   --  11*  ALKPHOS  --   --   --  57  BILITOT  --   --   --  0.4  PROT  --   --   --  5.2*  ALBUMIN 1.8* 1.6* 1.7* 1.9*   No results for input(s): LIPASE, AMYLASE in the last 168 hours. No results for input(s): AMMONIA in the last 168 hours. CBC: Recent Labs  Lab 09/05/17 0907 09/06/17 0344 09/08/17 0220 09/09/17 1119 09/11/17 0314  WBC 12.9* 14.1* 13.6* 12.7* 14.0*  NEUTROABS  --   --  9.6* 9.5*  --   HGB 11.5* 10.3* 10.2* 11.1* 10.8*  HCT 37.1 32.9* 31.2* 34.4* 32.5*  MCV 91.6 91.4 92.0 91.7 92.1  PLT 326 324 355 440* 466*   Cardiac Enzymes: No results for input(s): CKTOTAL, CKMB, CKMBINDEX, TROPONINI in the last 168 hours. BNP (last 3 results) Recent Labs    09/01/17 1807 09/09/17 1119  BNP 275.0* 176.7*    ProBNP (last 3 results) No results for input(s): PROBNP in the last 8760 hours.  CBG: Recent Labs  Lab 09/09/17 1620 09/09/17 2004 09/10/17 0013 09/10/17 0403 09/10/17 1110  GLUCAP 121* 108* 124* 129* 151*    Recent Results (from the past 240 hour(s))  Culture, body fluid-bottle     Status: None (Preliminary result)   Collection Time: 09/10/17  3:50 PM  Result Value Ref Range Status   Specimen Description FLUID PLEURAL LEFT  Final   Special Requests BOTTLES DRAWN AEROBIC AND ANAEROBIC  Final   Culture NO GROWTH < 24 HOURS  Final   Report Status PENDING  Incomplete  Gram stain     Status: None   Collection Time: 09/10/17  3:50 PM  Result Value Ref Range Status   Specimen Description FLUID PLEURAL LEFT  Final   Special Requests NONE  Final   Gram Stain   Final    FEW WBC PRESENT, PREDOMINANTLY MONONUCLEAR NO ORGANISMS SEEN    Report Status 09/10/2017 FINAL  Final     Studies: Dg Chest 1 View  Result Date: 09/10/2017 CLINICAL DATA:  Status post thoracentesis  EXAM: CHEST 1 VIEW COMPARISON:  CT chest 09/07/2017 FINDINGS: Small right pleural effusion. Mild bilateral interstitial prominence. Small left pleural effusion. No pneumothorax. Stable cardiomegaly. No acute osseous abnormality. IMPRESSION: Small right pleural effusion.  No pneumothorax. Electronically Signed   By: Elige Ko   On: 09/10/2017 15:51   Ir Thoracentesis Asp Pleural Space W/img Guide  Result Date: 09/10/2017 INDICATION: Possible pneumonia with left-sided pleural effusion noted on recent CT imaging. Request is made for diagnostic and therapeutic thoracentesis. EXAM: ULTRASOUND GUIDED DIAGNOSTIC AND THERAPEUTIC THORACENTESIS  MEDICATIONS: 2% lidocaine COMPLICATIONS: None immediate. PROCEDURE: An ultrasound guided thoracentesis was thoroughly discussed with the patient and questions answered. The benefits, risks, alternatives and complications were also discussed. The patient understands and wishes to proceed with the procedure. Written consent was obtained. Ultrasound was performed to localize and mark an adequate pocket of fluid in the left chest. The area was then prepped and draped in the normal sterile fashion. 2% Lidocaine was used for local anesthesia. Under ultrasound guidance a Safe-T-Centesis catheter was introduced. Thoracentesis was performed. The catheter was removed and a dressing applied. FINDINGS: A total of approximately 0.2 L of serous fluid was removed. Samples were sent to the laboratory as requested by the clinical team. IMPRESSION: Successful ultrasound guided left thoracentesis yielding 0.2 L of pleural fluid. Read by: Barnetta ChapelKelly Osborne, PA-C No pneumothorax on follow-up radiograph. Electronically Signed   By: Corlis Leak  Hassell M.D.   On: 09/10/2017 15:48    Scheduled Meds: . diltiazem  30 mg Oral TID  . metoprolol tartrate  25 mg Oral BID  . pantoprazole  40 mg Oral BID   Continuous Infusions:   Principal Problem:   Sepsis (HCC) Active Problems:   Meniere's disease    Anxiety   Essential hypertension   GERD (gastroesophageal reflux disease)   Hiatal hernia   AKI (acute kidney injury) (HCC)   GI bleed   Hematemesis   Melena   Hypokalemia   Leukocytosis   Elevated lactic acid level   Intractable nausea and vomiting   Tachycardia   Palliative care by specialist   Goals of care, counseling/discussion    Time spent: >35 minutes     Esperanza SheetsBURIEV, Domenique Southers N  [facility] Pager [phone]. If 7PM-7AM, please contact night-coverage at [email or web address], password [email or web address] 09/11/2017, 11:56 AM  LOS: 10 days

## 2017-09-11 NOTE — Progress Notes (Signed)
Occupational Therapy Treatment Patient Details Name: Nicole BlonderDorothy L Ranganathan MRN: [id number] DOB: [birth year] Today's Date: 09/11/2017    History of present illness Pt adm with sepsis likely due to colitis or PNA. PMH - HTN, aspiration PNA, fibromyalgia, incarcerated hiatal hernia, anxiety, malnutrition   OT comments  Pt agreeable for first time to getting out of bed today. Pt continues to need a great amount of encouragement to participate in any activities at all but was willing to get OOB today. Pt was w/o O2 and O2 sats stayed at 89% and above throughout the session. Pt requires a great amount of care but family continues to want to take pt home instead of SNF.    Follow Up Recommendations  Supervision/Assistance - 24 hour    Equipment Recommendations  None recommended by OT    Recommendations for Other Services      Precautions / Restrictions Precautions Precautions: Fall Restrictions Weight Bearing Restrictions: No       Mobility Bed Mobility Overal bed mobility: Needs Assistance Bed Mobility: Supine to Sit     Supine to sit: Mod assist     General bed mobility comments: Assist with hips and shoulders  Transfers Overall transfer level: Needs assistance Equipment used: 1 person hand held assist Transfers: Stand Pivot Transfers;Sit to/from Stand Sit to Stand: Mod assist Stand pivot transfers: Mod assist       General transfer comment: Pt with great difficulty weight shifting to pick feet up.     Balance Overall balance assessment: Needs assistance Sitting-balance support: Feet supported Sitting balance-Leahy Scale: Poor Sitting balance - Comments: PT fatigues and does not hold self up.   Standing balance support: During functional activity;Bilateral upper extremity supported Standing balance-Leahy Scale: Zero Standing balance comment: Pt needs full outside assist to remain standing.                           ADL either performed or assessed with  clinical judgement   ADL Overall ADL's : Needs assistance/impaired Eating/Feeding: Set up   Grooming: Wash/dry face;Oral care;Set up;Sitting   Upper Body Bathing: Moderate assistance Upper Body Bathing Details (indicate cue type and reason): pt fatigues quickly and asks for help stating she cannot do any more. Lower Body Bathing: Maximal assistance;Bed level           Toilet Transfer: Moderate assistance;Stand-pivot;BSC Toilet Transfer Details (indicate cue type and reason): Pt required encouragment to attempt standing. Toileting- Clothing Manipulation and Hygiene: Total assistance;Sit to/from stand Toileting - Clothing Manipulation Details (indicate cue type and reason): pt stood while therapist cleaned pt while giving mod assist to remain standing with the walker.     Functional mobility during ADLs: Moderate assistance General ADL Comments: Pt very fatigued and not willing to be pushed to do too much     Vision       Perception     Praxis      Cognition Arousal/Alertness: Awake/alert Behavior During Therapy: WFL for tasks assessed/performed Overall Cognitive Status: History of cognitive impairments - at baseline                                 General Comments: Pt very weak and requiring encouragement to participate but did get OOB today.        Exercises     Shoulder Instructions       General Comments Stood with pt x1 after  transfer attempting to weight shift and pt was able to for appx 8 sec before returning to sitting.    Pertinent Vitals/ Pain       Pain Assessment: 0-10 Pain Score: 8  Pain Location: "everywhere" Pain Descriptors / Indicators: Aching;Discomfort Pain Intervention(s): Limited activity within patient's tolerance;Monitored during session;Repositioned;RN gave pain meds during session  Home Living                                          Prior Functioning/Environment              Frequency  Min  2X/week        Progress Toward Goals  OT Goals(current goals can now be found in the care plan section)  Progress towards OT goals: Progressing toward goals  Acute Rehab OT Goals Patient Stated Goal: to not have pain OT Goal Formulation: With patient Time For Goal Achievement: 09/20/17 Potential to Achieve Goals: Fair ADL Goals Pt Will Transfer to Toilet: with min assist;bedside commode;stand pivot transfer Pt Will Perform Toileting - Clothing Manipulation and hygiene: with min guard assist;sitting/lateral leans;with caregiver independent in assisting Additional ADL Goal #1: Caregiver demonstrate ability to compelte ADL with pt at bed level.  Plan Discharge plan remains appropriate    Co-evaluation                 AM-PAC PT "6 Clicks" Daily Activity     Outcome Measure   Help from another person eating meals?: A Little Help from another person taking care of personal grooming?: A Little Help from another person toileting, which includes using toliet, bedpan, or urinal?: Total Help from another person bathing (including washing, rinsing, drying)?: A Lot Help from another person to put on and taking off regular upper body clothing?: A Lot Help from another person to put on and taking off regular lower body clothing?: Total 6 Click Score: 12    End of Session    OT Visit Diagnosis: Muscle weakness (generalized) (M62.81);Other abnormalities of gait and mobility (R26.89);Pain Pain - part of body: Shoulder;Leg   Activity Tolerance Patient limited by fatigue   Patient Left in chair;with call bell/phone within reach;with family/visitor present   Nurse Communication Mobility status        Time: 4098-1191 OT Time Calculation (min): 31 min  Charges: OT General Charges $OT Visit: 1 Visit OT Treatments $Self Care/Home Management : 23-37 mins  Tory Emerald, OTR/L [phone]   Hope Budds 09/11/2017, 11:23 AM

## 2017-09-11 NOTE — Progress Notes (Signed)
Nutrition Follow Up  DOCUMENTATION CODES:   Severe malnutrition in context of chronic illness, Underweight  INTERVENTION:    Ensure Enlive po BID, each supplement provides 350 kcal and 20 grams of protein  NUTRITION DIAGNOSIS:   Severe Malnutrition related to chronic illness(recurrent hiatal hernia, anxiety, fibromyalgia ) as evidenced by severe fat depletion, severe muscle depletion, ongoing  GOAL:   Patient will meet greater than or equal to 90% of their needs, progressing  MONITOR:   Diet advancement, PO intake, Labs, Skin, Weight trends  ASSESSMENT:   84 y.o. Female with PMH significant for large hiatal hernia with history of incarcerated hiatal hernia with obstruction 2016, aspiration pneumonia hypertension, fibromyalgia, anxiety, GERD, malnutrition presents to the emergency Department chief complaint intractable nausea and vomiting hematemesis.  Pt s/p swallow evaluation 1/9. Advanced to Regular, thin diet. PO intake variable at 0-85% per flowsheet records. S/p thoracentesis for left pleural effusion 1/14. Labs and medications reviewed. CBG's [phone].  Diet Order:  Diet regular Room service appropriate? Yes; Fluid consistency: Thin  EDUCATION NEEDS:   No education needs have been identified at this time  Skin:  Skin Assessment: Reviewed RN Assessment  Last BM:  1/14  Height:   Ht Readings from Last 1 Encounters:  09/01/17 5\' 8"  (1.727 m)   Weight:   Wt Readings from Last 1 Encounters:  09/11/17 114 lb 6.7 oz (51.9 kg)   Wt Readings from Last 10 Encounters:  09/11/17 114 lb 6.7 oz (51.9 kg)  03/09/17 103 lb 12.8 oz (47.1 kg)  10/19/16 99 lb 9.6 oz (45.2 kg)  02/04/15 121 lb 4.1 oz (55 kg)  08/15/13 117 lb (53.1 kg)  07/18/11 120 lb (54.4 kg)   Ideal Body Weight:  63.6 kg  BMI:  Body mass index is 17.4 kg/m.  Estimated Nutritional Needs:   Kcal:  1300-1500  Protein:  60-75 gm  Fluid:  >/= 1.5 L  Maureen ChattersKatie Aymar Whitfill, RD, LDN Pager #:  [phone] After-Hours Pager #: [phone]

## 2017-09-12 ENCOUNTER — Inpatient Hospital Stay (HOSPITAL_COMMUNITY): Payer: Medicare Other

## 2017-09-12 LAB — CBC
HCT: 34.7 % — ABNORMAL LOW (ref 36.0–46.0)
Hemoglobin: 11.4 g/dL — ABNORMAL LOW (ref 12.0–15.0)
MCH: 30.5 pg (ref 26.0–34.0)
MCHC: 32.9 g/dL (ref 30.0–36.0)
MCV: 92.8 fL (ref 78.0–100.0)
PLATELETS: 488 10*3/uL — AB (ref 150–400)
RBC: 3.74 MIL/uL — ABNORMAL LOW (ref 3.87–5.11)
RDW: 16.3 % — AB (ref 11.5–15.5)
WBC: 18.7 10*3/uL — ABNORMAL HIGH (ref 4.0–10.5)

## 2017-09-12 LAB — PATHOLOGIST SMEAR REVIEW: PATH REVIEW: REACTIVE

## 2017-09-12 MED ORDER — HYDROCODONE-ACETAMINOPHEN 5-325 MG PO TABS
1.0000 | ORAL_TABLET | ORAL | Status: DC | PRN
  Administered 2017-09-12 – 2017-09-14 (×6): 1 via ORAL
  Filled 2017-09-12 (×6): qty 1

## 2017-09-12 MED ORDER — PIPERACILLIN-TAZOBACTAM 3.375 G IVPB
3.3750 g | Freq: Three times a day (TID) | INTRAVENOUS | Status: DC
  Administered 2017-09-12 – 2017-09-14 (×6): 3.375 g via INTRAVENOUS
  Filled 2017-09-12 (×9): qty 50

## 2017-09-12 NOTE — Progress Notes (Signed)
Daily Progress Note   Patient Name: Nicole Key       Date: 09/12/2017 DOB: [birth year]  Age: 84 y.o. MRN#: [id number] Attending Physician: Alba Coryegalado, Belkys A, MD Primary Care Physician: Geoffry ParadiseAronson, Richard, MD Admit Date: 09/01/2017  Reason for Consultation/Follow-up: Establishing goals of care  Subjective/GOC: Patient awake, alert, and oriented. C/o of generalized pain. She tells me RN gave prn morphine 15 minutes ago. She takes Norco 5-325 1tab at home. Agreeable with me to add Norco to med list. Niece, Olegario MessierKathy at bedside  She is hopeful to go home today. She is anxious to get home. I further explored their thoughts on home with hospice services. Patient attempted to discuss with grandson last night but he "was in a rush." Her and Olegario MessierKathy decided for current plan to go home with home health and see how she does. They are considering palliative/hospice services in the future.   Emotional support provided.    Length of Stay: 11  Current Medications: Scheduled Meds:  . diltiazem  30 mg Oral TID  . feeding supplement (ENSURE ENLIVE)  237 mL Oral BID BM  . metoprolol tartrate  25 mg Oral BID  . pantoprazole  40 mg Oral BID    Continuous Infusions:   PRN Meds: acetaminophen **OR** acetaminophen, albuterol, hydrALAZINE, lidocaine (PF), loperamide, Melatonin, morphine injection  Physical Exam  Constitutional: She is oriented to person, place, and time. She appears cachectic. She is cooperative. She appears ill.  HENT:  Head: Normocephalic and atraumatic.  Cardiovascular: Regular rhythm.  Pulmonary/Chest: No accessory muscle usage. No tachypnea. No respiratory distress.  On RA  Abdominal: Normal appearance.  Neurological: She is alert and oriented to person, place, and time.  Skin:  Skin is warm and dry. There is pallor.  Psychiatric: She has a normal mood and affect. Her speech is normal and behavior is normal. Cognition and memory are normal.  Nursing note and vitals reviewed.          Vital Signs: BP (!) 142/76 (BP Location: Left Arm)   Pulse 77   Temp 98.4 F (36.9 C) (Oral)   Resp 16   Ht 5\' 8"  (1.727 m)   Wt 51.9 kg (114 lb 6.7 oz)   SpO2 92%   BMI 17.40 kg/m  SpO2: SpO2: 92 % O2 Device: O2 Device: Not  Delivered O2 Flow Rate: O2 Flow Rate (L/min): 2 L/min  Intake/output summary:   Intake/Output Summary (Last 24 hours) at 09/12/2017 1141 Last data filed at 09/12/2017 0825 Gross per 24 hour  Intake 240 ml  Output 600 ml  Net -360 ml   LBM: Last BM Date: 09/11/17 Baseline Weight: Weight: 44.5 kg (98 lb) Most recent weight: Weight: 51.9 kg (114 lb 6.7 oz)   Palliative Assessment/Data: PPS 40%   Flowsheet Rows     Most Recent Value  Intake Tab  Referral Department  Hospitalist  Unit at Time of Referral  Cardiac/Telemetry Unit  Palliative Care Primary Diagnosis  Sepsis/Infectious Disease  Date Notified  09/03/17  Palliative Care Type  New Palliative care  Reason for referral  Clarify Goals of Care  Date of Admission  09/01/17  Date first seen by Palliative Care  09/04/17  # of days Palliative referral response time  1 Day(s)  # of days IP prior to Palliative referral  2  Clinical Assessment  Palliative Performance Scale Score  40%  Psychosocial & Spiritual Assessment  Palliative Care Outcomes  Patient/Family meeting held?  Yes  Who was at the meeting?  patient and niece  Palliative Care Outcomes  Clarified goals of care, Provided end of life care assistance, Provided psychosocial or spiritual support, ACP counseling assistance, Counseled regarding hospice      Patient Active Problem List   Diagnosis Date Noted  . Pleural effusion   . At risk for aspiration   . Protein-calorie malnutrition (HCC)   . Palliative care by specialist   .  Goals of care, counseling/discussion   . GI bleed 09/01/2017  . Hematemesis 09/01/2017  . Melena 09/01/2017  . Hypokalemia 09/01/2017  . Leukocytosis 09/01/2017  . Elevated lactic acid level 09/01/2017  . Intractable nausea and vomiting 09/01/2017  . Tachycardia 09/01/2017  . Dysphagia   . AKI (acute kidney injury) (HCC) 03/09/2017  . Severely underweight adult 03/09/2017  . Abnormal weight loss 03/09/2017  . Cerebrovascular disease 03/09/2017  . Globus sensation 03/09/2017  . Chest pain, rule out acute myocardial infarction 03/08/2017  . Hiatal hernia 02/07/2015  . Sepsis (HCC) 02/04/2015  . Anxiety 02/04/2015  . Essential hypertension 02/04/2015  . GERD (gastroesophageal reflux disease) 02/04/2015  . Protein-calorie malnutrition, severe (HCC) 02/04/2015  . Unstable gait 08/15/2013  . Depression 08/15/2013  . Osteoporosis 07/18/2011  . Meniere's disease 07/18/2011    Palliative Care Assessment & Plan   Patient Profile: 84 y.o. female  with past medical history of incarcerated hiatal hernia with obstruction in 2016, Meniere's disease, syncope, HTN, GERD, fibromyalgia, aspiration pneumonia, arthritis, anxiety, and malnutrition admitted on 09/01/2017 with nausea, vomiting, abdominal pain, and dark stools. In ED, patient with elevated lactic acid, hypokalemia, leukocytosis, tachycardia, and acute kidney injury. Sepsis and acute respiratory failure secondary to possible pneumonia and colitis. Receiving IV antibiotics and IVF. GI evaluated but patient was not stable for EGD. Continue with supportive care. HgB stable and GI has signed off. PT/OT/SLP consulted. Palliative medicine consultation for goals of care.   Assessment: Sepsis Pneumonia Colitis Acute hypoxic respiratory failure Pleural effusion s/p thoracentesis 1/14 High risk for aspiration AKI Hypoalbuminemia  Recommendations/Plan:  Continue current interventions.  Added hydrocodone 5-325 1tab q4h prn moderate/severe  pain (Patient home dose).   F/u from hospice conversation yesterday. Patient/niece prefer to continue with plan for home with home health. They are still considering hospice services in the near future.   Goals of Care and Additional Recommendations:  No  resuscitation/life support. Continue medical management  Code Status: Limited   Code Status Orders  (From admission, onward)        Start     Ordered   09/01/17 1150  Limited resuscitation (code)  Continuous    Question Answer Comment  In the event of cardiac or respiratory ARREST: Initiate Code Blue, Call Rapid Response Yes   In the event of cardiac or respiratory ARREST: Perform CPR No   In the event of cardiac or respiratory ARREST: Perform Intubation/Mechanical Ventilation No   In the event of cardiac or respiratory ARREST: Use NIPPV/BiPAp only if indicated Yes   In the event of cardiac or respiratory ARREST: Administer ACLS medications if indicated Yes   In the event of cardiac or respiratory ARREST: Perform Defibrillation or Cardioversion if indicated Yes      09/01/17 1153    Code Status History    Date Active Date Inactive Code Status Order ID Comments User Context   03/08/2017 21:09 03/10/2017 17:17 Full Code [id number]  Hillary Bow, DO ED   10/18/2016 15:36 10/19/2016 21:10 Full Code [id number]  Gwenyth Bender, NP ED   02/04/2015 05:23 02/09/2015 18:56 DNR [id number]  Lorretta Harp, MD ED    Advance Directive Documentation     Most Recent Value  Type of Advance Directive  Healthcare Power of Attorney  Pre-existing out of facility DNR order (yellow form or pink MOST form)  No data  "MOST" Form in Place?  No data       Prognosis:   Unable to determine: guarded  Discharge Planning:  Home with Home Health   Care plan was discussed with RN, patient, niece Olegario Messier) at bedside  Thank you for allowing the Palliative Medicine Team to assist in the care of this patient.   Time In: 1130 Time Out: 1145 Total Time 15  Prolonged Time Billed  no      Greater than 50%  of this time was spent counseling and coordinating care related to the above assessment and plan.  Vennie Homans, FNP-C Palliative Medicine Team  Phone: [phone] Fax: [phone]  Please contact Palliative Medicine Team phone at [phone] for questions and concerns.

## 2017-09-12 NOTE — Progress Notes (Signed)
[facility] PROGRESS NOTE  Nicole Key [id number] DOB: 07/30/1928 DOA: 09/01/2017 PCP: Geoffry ParadiseAronson, Richard, MD  Brief summary   84 y.o.femalewith past medical history significantforlarge hiatal hernia, incarcerated hiatal hernia with obstruction, aspiration pneumonia, hypertension, fibromyalgia, anxiety, GERD, and malnutrition. Patient was admitted with sepsis secondary to Possible PNA and colitis. GI bleed was documented, evaluated by GI who recommended supportive care. Patient has continued to be generally weak, requiring supplemental oxygen. Last ECHO was in 2014.  Patient insists on being discharged back home when optimized despite recommendation of brief rehab versus higher level of care. Patient understands, and voices that her prognosis is guarded, and would like to spend time with her family at home. plan for Home with Home Health PT/Nursing/Aide. At some point, depending on patient's progress, follow up at home may have to be transitioned to Palliative care team/Hospice.  Overall, prognosis is guarded. Remains severely deconditioned. Patient today with increase leukocytosis, will restart antibiotic and repeat chest x ray.    Assessment/Plan:  Sepsis; PNA vs colitis. Presents with leukocytosis , lactic acidosis. WBC trending down. 34---12.  Lactic acid decrease from 12---4.9 -improved. Completed course of antibiotics. -patient today with worsening leukocytosis, she is at risk for aspiration due to large hiatal hernia. Will restart IV zosyn, repeat chest  Xray.   Acute hypoxic respiratory failure; in setting PNA. Aspiration. Guarded prognosis -treated with zosyn. Blood culture: NGTD.  Incentive spirometry. Improved. Echo: LVEF 60% -large symptomatic pleural effusion on CT. Underwent US thoracentesis on 11/14. Gram stain is negative, preilim cultures: NGTD  GI bleed, hematemesis. Transfused  one unit PRBC 1-06 and 1-07. On Protonix.  -Hg remains stable    Hypomagnesemia/Hypokalemia and Hypophosphatemia - Repleted   Acute Encephalopathy: Resolved.  Colitis: C diff negative. GI pathogen negative. Antidiarrheal agent, Support care, IV fluids antibiotics,  -Improved   AKI; in setting of sepsis, hypovolemia. Cr peak to 1.5. Improved with IV fluids.   HTN; Restart home meds. PRN Hydralazine.   Fibromyalgia. Appears stable at baseline.   Hypoalbuminemia: Possible indicative of guarded prognosis versus severe malnutrition.  Prognosis is guarded, patient with severe deconditioning. Refused to go to SNF.   Code Status: partial  Family Communication: d/w patient, and niece.  Disposition Plan: Home health at discharge.    Consultants:   GI   Palliative care consult    Procedures:  US thoracentesis    Antimicrobials:  Zosyn 09-01-2017     HPI/Subjective: She is alert, eating lunch. Still with productive cough.    Objective: Vitals:   09/12/17 1012 09/12/17 1200  BP: (!) 142/76 127/74  Pulse:  75  Resp:  (!) 28  Temp: 98.4 F (36.9 C)   SpO2: 92% 95%    Intake/Output Summary (Last 24 hours) at 09/12/2017 1327 Last data filed at 09/12/2017 1229 Gross per 24 hour  Intake 360 ml  Output 600 ml  Net -240 ml   Filed Weights   09/09/17 0412 09/10/17 0404 09/11/17 0454  Weight: 60 kg (132 lb 4.4 oz) 51.7 kg (113 lb 15.7 oz) 51.9 kg (114 lb 6.7 oz)    Exam:   General:  Alert, NAD  Cardiovascular: S 1, S 2 RRR  Respiratory: No acute distress. Bilateral ronchus.   Abdomen: soft, nt, nd  Musculoskeletal: no edema  Data Reviewed: Basic Metabolic Panel: Recent Labs  Lab 09/06/17 0344 09/06/17 1601 09/07/17 0158 09/07/17 1837 09/08/17 0220 09/09/17 0352 09/10/17 1835  NA 139  --  138  --  138 138 137  K 3.1*  --  3.2*  --  3.6 4.0 3.6  CL 108  --  107  --  108 105 103  CO2 26  --  24  --  26 26 25   GLUCOSE 128*  --  120*  --  113* 111* 131*  BUN <5*  --  <5*  --  <5* <5* 5*  CREATININE  0.44  --  0.48  --  0.51 0.50 0.59  CALCIUM 7.5*  --  7.6*  --  7.5* 7.7* 7.9*  MG  --  1.5*  --  1.9  --  2.0  --   PHOS  --   --  2.1* 2.7 2.5  2.3* 2.6  --    Liver Function Tests: Recent Labs  Lab 09/07/17 0158 09/08/17 0220 09/09/17 0352 09/10/17 1835  AST  --   --   --  18  ALT  --   --   --  11*  ALKPHOS  --   --   --  57  BILITOT  --   --   --  0.4  PROT  --   --   --  5.2*  ALBUMIN 1.8* 1.6* 1.7* 1.9*   No results for input(s): LIPASE, AMYLASE in the last 168 hours. No results for input(s): AMMONIA in the last 168 hours. CBC: Recent Labs  Lab 09/06/17 0344 09/08/17 0220 09/09/17 1119 09/11/17 0314 09/12/17 1223  WBC 14.1* 13.6* 12.7* 14.0* 18.7*  NEUTROABS  --  9.6* 9.5*  --   --   HGB 10.3* 10.2* 11.1* 10.8* 11.4*  HCT 32.9* 31.2* 34.4* 32.5* 34.7*  MCV 91.4 92.0 91.7 92.1 92.8  PLT 324 355 440* 466* 488*   Cardiac Enzymes: No results for input(s): CKTOTAL, CKMB, CKMBINDEX, TROPONINI in the last 168 hours. BNP (last 3 results) Recent Labs    09/01/17 1807 09/09/17 1119  BNP 275.0* 176.7*    ProBNP (last 3 results) No results for input(s): PROBNP in the last 8760 hours.  CBG: Recent Labs  Lab 09/09/17 1620 09/09/17 2004 09/10/17 0013 09/10/17 0403 09/10/17 1110  GLUCAP 121* 108* 124* 129* 151*    Recent Results (from the past 240 hour(s))  Culture, body fluid-bottle     Status: None (Preliminary result)   Collection Time: 09/10/17  3:50 PM  Result Value Ref Range Status   Specimen Description FLUID PLEURAL LEFT  Final   Special Requests BOTTLES DRAWN AEROBIC AND ANAEROBIC  Final   Culture NO GROWTH < 24 HOURS  Final   Report Status PENDING  Incomplete  Gram stain     Status: None   Collection Time: 09/10/17  3:50 PM  Result Value Ref Range Status   Specimen Description FLUID PLEURAL LEFT  Final   Special Requests NONE  Final   Gram Stain   Final    FEW WBC PRESENT, PREDOMINANTLY MONONUCLEAR NO ORGANISMS SEEN    Report Status  09/10/2017 FINAL  Final     Studies: Dg Chest 1 View  Result Date: 09/10/2017 CLINICAL DATA:  Status post thoracentesis EXAM: CHEST 1 VIEW COMPARISON:  CT chest 09/07/2017 FINDINGS: Small right pleural effusion. Mild bilateral interstitial prominence. Small left pleural effusion. No pneumothorax. Stable cardiomegaly. No acute osseous abnormality. IMPRESSION: Small right pleural effusion.  No pneumothorax. Electronically Signed   By: Elige Ko   On: 09/10/2017 15:51   Ir Thoracentesis Asp Pleural Space W/img Guide  Result Date: 09/10/2017 INDICATION: Possible pneumonia with left-sided pleural effusion noted on  recent CT imaging. Request is made for diagnostic and therapeutic thoracentesis. EXAM: ULTRASOUND GUIDED DIAGNOSTIC AND THERAPEUTIC THORACENTESIS MEDICATIONS: 2% lidocaine COMPLICATIONS: None immediate. PROCEDURE: An ultrasound guided thoracentesis was thoroughly discussed with the patient and questions answered. The benefits, risks, alternatives and complications were also discussed. The patient understands and wishes to proceed with the procedure. Written consent was obtained. Ultrasound was performed to localize and mark an adequate pocket of fluid in the left chest. The area was then prepped and draped in the normal sterile fashion. 2% Lidocaine was used for local anesthesia. Under ultrasound guidance a Safe-T-Centesis catheter was introduced. Thoracentesis was performed. The catheter was removed and a dressing applied. FINDINGS: A total of approximately 0.2 L of serous fluid was removed. Samples were sent to the laboratory as requested by the clinical team. IMPRESSION: Successful ultrasound guided left thoracentesis yielding 0.2 L of pleural fluid. Read by: Barnetta Chapel, PA-C No pneumothorax on follow-up radiograph. Electronically Signed   By: Corlis Leak M.D.   On: 09/10/2017 15:48    Scheduled Meds: . diltiazem  30 mg Oral TID  . feeding supplement (ENSURE ENLIVE)  237 mL Oral BID BM   . metoprolol tartrate  25 mg Oral BID  . pantoprazole  40 mg Oral BID   Continuous Infusions:   Principal Problem:   Sepsis (HCC) Active Problems:   Meniere's disease   Anxiety   Essential hypertension   GERD (gastroesophageal reflux disease)   Hiatal hernia   AKI (acute kidney injury) (HCC)   GI bleed   Hematemesis   Melena   Hypokalemia   Leukocytosis   Elevated lactic acid level   Intractable nausea and vomiting   Tachycardia   Palliative care by specialist   Goals of care, counseling/discussion   Pleural effusion   At risk for aspiration   Protein-calorie malnutrition (HCC)    Time spent: >35 minutes     Kobi Aller A Yulisa Chirico  [facility] Pager [phone]. If 7PM-7AM, please contact night-coverage at [email or web address], password [facility] 09/12/2017, 1:27 PM  LOS: 11 days

## 2017-09-12 NOTE — Progress Notes (Signed)
Physical Therapy Discharge Patient Details Name: Nicole BlonderDorothy L Schremp MRN: [id number] DOB: 05/04/1928 Today's Date: 09/12/2017 Time:  -     Patient discharged from PT services secondary to medical decline - will need to re-order PT to resume therapy services.  Please see latest therapy progress note for current level of functioning and progress toward goals.    Progress and discharge plan discussed with patient and/or caregiver: Patient agrees with plan  GP     Etola Mull Gertha CalkinJ Aidan Caloca Sahas Sluka, PTA pager [phone]  09/12/2017, 4:28 PM

## 2017-09-12 NOTE — Progress Notes (Addendum)
PT Cancellation Note  Patient Details Name: Nicole BlonderDorothy L Rattigan MRN: [id number] DOB: 02-03-1928   Cancelled Treatment:    Reason Eval/Treat Not Completed: (P) Pain limiting ability to participate;Fatigue/lethargy limiting ability to participate;Medical issues which prohibited therapy(Pt refusing therapy for the 3rd consecutive time in a row.  Discussed with patient policy on refusals and informed patient we will sign off based on lack of pariticipation.  Pt in agreement.  Please re-order if patient becomes appropriate.) Will inform supervising PT of need for d/c from PT services at this time.     Aneesha Holloran Artis DelayJ Kaipo Ardis 09/12/2017, 4:25 PM  Joycelyn RuaAimee Deeksha Cotrell, PTA pager [phone]

## 2017-09-12 NOTE — Progress Notes (Signed)
Pharmacy Antibiotic Note  Nicole BlonderDorothy L Jamar is a 84 y.o. female to be restarted on zosyn with concern for aspiration pneumonia. Renal function stable.   Plan: 1) Zosyn 3.375g IV q8 (4 hour infusion) 2) Follow renal function, cultures, LOT  Height: 5\' 8"  (172.7 cm) Weight: 114 lb 6.7 oz (51.9 kg) IBW/kg (Calculated) : 63.9  Temp (24hrs), Avg:98.4 F (36.9 C), Min:98 F (36.7 C), Max:99.1 F (37.3 C)  Recent Labs  Lab 09/06/17 0344 09/07/17 0158 09/08/17 0220 09/09/17 0352 09/09/17 1119 09/10/17 1835 09/11/17 0314 09/12/17 1223  WBC 14.1*  --  13.6*  --  12.7*  --  14.0* 18.7*  CREATININE 0.44 0.48 0.51 0.50  --  0.59  --   --     Estimated Creatinine Clearance: 40.6 mL/min (by C-G formula based on SCr of 0.59 mg/dL).    Allergies  Allergen Reactions  . Shellfish Allergy Hives    Antimicrobials this admission: Zosyn 1/5>> 1/12, resume 1/14 >>  Microbiology results: 1/14 left pleural fluid -  1/5 Blood x 2 - NG 1/5 GI panel -  negative 1/5 C diff - negatve  Thank you for allowing pharmacy to be a part of this patient's care.  Fredrik RiggerMarkle, Ngoc Daughtridge Sue 09/12/2017 1:46 PM

## 2017-09-13 LAB — BASIC METABOLIC PANEL
Anion gap: 9 (ref 5–15)
BUN: 10 mg/dL (ref 6–20)
CHLORIDE: 103 mmol/L (ref 101–111)
CO2: 27 mmol/L (ref 22–32)
Calcium: 8.2 mg/dL — ABNORMAL LOW (ref 8.9–10.3)
Creatinine, Ser: 0.65 mg/dL (ref 0.44–1.00)
GFR calc Af Amer: >60 mL/min (ref >60)
GFR calc non Af Amer: >60 mL/min (ref >60)
Glucose, Bld: 106 mg/dL — ABNORMAL HIGH (ref 65–99)
POTASSIUM: 3.7 mmol/L (ref 3.5–5.1)
SODIUM: 139 mmol/L (ref 135–145)

## 2017-09-13 LAB — CBC
HEMATOCRIT: 32.1 % — AB (ref 36.0–46.0)
Hemoglobin: 10.3 g/dL — ABNORMAL LOW (ref 12.0–15.0)
MCH: 29.8 pg (ref 26.0–34.0)
MCHC: 32.1 g/dL (ref 30.0–36.0)
MCV: 92.8 fL (ref 78.0–100.0)
Platelets: 457 10*3/uL — ABNORMAL HIGH (ref 150–400)
RBC: 3.46 MIL/uL — ABNORMAL LOW (ref 3.87–5.11)
RDW: 16.3 % — AB (ref 11.5–15.5)
WBC: 17.4 10*3/uL — ABNORMAL HIGH (ref 4.0–10.5)

## 2017-09-13 NOTE — Progress Notes (Signed)
[facility] PROGRESS NOTE  Nicole Key UEA:[id number] DOB: 11/02/1927 DOA: 09/01/2017 PCP: Geoffry Paradise, MD  Brief summary   84 y.o.femalewith past medical history significantforlarge hiatal hernia, incarcerated hiatal hernia with obstruction, aspiration pneumonia, hypertension, fibromyalgia, anxiety, GERD, and malnutrition. Patient was admitted with sepsis secondary to Possible PNA and colitis. GI bleed was documented, evaluated by GI who recommended supportive care. Patient has continued to be generally weak, requiring supplemental oxygen. Last ECHO was in 2014.  Patient insists on being discharged back home when optimized despite recommendation of brief rehab versus higher level of care. Patient understands, and voices that her prognosis is guarded, and would like to spend time with her family at home. plan for Home with Home Health PT/Nursing/Aide. At some point, depending on patient's progress, follow up at home may have to be transitioned to Palliative care team/Hospice.  Overall, prognosis is guarded. Remains severely deconditioned. Patient today with increase leukocytosis, will restart antibiotic and repeat chest x ray.    Assessment/Plan:  Sepsis; PNA vs colitis. Presents with leukocytosis , lactic acidosis. WBC trending down. 34---12.  Lactic acid decrease from 12---4.9 -improved. Completed course of antibiotics. -Patient today with worsening leukocytosis, she is at risk for aspiration due to large hiatal hernia.  -Restarted  IV zosyn 1-16, repeated chest x ray no loculated effusion.    Leukocytosis; suspect related to recurrent PNA, aspiration.  LFT normal.  Pleural fluid, no growth to date.  Check UA.  Follow trend.  Started on zosyn.   Acute hypoxic respiratory failure; in setting PNA. Aspiration. Guarded prognosis -treated with zosyn. Blood culture: NGTD.  Incentive spirometry. Improved. Echo: LVEF 60% -large symptomatic pleural effusion on CT.  Underwent US thoracentesis on 11/14. Gram stain is negative, preilim cultures: NGTD  GI bleed, hematemesis. Transfused  one unit PRBC 1-06 and 1-07. On Protonix.  -Hg remains stable   Hypomagnesemia/Hypokalemia and Hypophosphatemia - Repleted   Acute Encephalopathy: Resolved.  Colitis: C diff negative. GI pathogen negative. Antidiarrheal agent, Support care, IV fluids antibiotics,  -Improved   AKI; in setting of sepsis, hypovolemia. Cr peak to 1.5. Improved with IV fluids.   HTN; Restart home meds. PRN Hydralazine.   Fibromyalgia. Appears stable at baseline.   Hypoalbuminemia: Possible indicative of guarded prognosis versus severe malnutrition.  Prognosis is guarded, patient with severe deconditioning. Refused to go to SNF.   Code Status: partial  Family Communication: d/w patient, and niece.  Disposition Plan: Home health at discharge. Home when WBC improved.    Consultants:   GI   Palliative care consult    Procedures:  US thoracentesis    Antimicrobials:  Zosyn 09-01-2017     HPI/Subjective: She is feeling weak, mild cough. Denies worsening abdominal pain. No diarrhea.    Objective: Vitals:   09/13/17 0735 09/13/17 0815  BP: 140/77 134/64  Pulse: 96 96  Resp: (!) 21 20  Temp: 98.6 F (37 C) 99 F (37.2 C)  SpO2: 94% 96%    Intake/Output Summary (Last 24 hours) at 09/13/2017 0947 Last data filed at 09/13/2017 4782 Gross per 24 hour  Intake 440 ml  Output 350 ml  Net 90 ml   Filed Weights   09/11/17 0454 09/12/17 1300 09/13/17 0638  Weight: 51.9 kg (114 lb 6.7 oz) 53.8 kg (118 lb 9.7 oz) 52.7 kg (116 lb 2.9 oz)    Exam:   General:  NAD  Cardiovascular: S 1, S 2 RRR  Respiratory: Bilateral ronchus.   Abdomen: soft, nt, nd  Musculoskeletal:  no edema  Data Reviewed: Basic Metabolic Panel: Recent Labs  Lab 09/06/17 1601 09/07/17 0158 09/07/17 1837 09/08/17 0220 09/09/17 0352 09/10/17 1835 09/13/17 0446  NA  --  138   --  138 138 137 139  K  --  3.2*  --  3.6 4.0 3.6 3.7  CL  --  107  --  108 105 103 103  CO2  --  24  --  26 26 25 27   GLUCOSE  --  120*  --  113* 111* 131* 106*  BUN  --  <5*  --  <5* <5* 5* 10  CREATININE  --  0.48  --  0.51 0.50 0.59 0.65  CALCIUM  --  7.6*  --  7.5* 7.7* 7.9* 8.2*  MG 1.5*  --  1.9  --  2.0  --   --   PHOS  --  2.1* 2.7 2.5  2.3* 2.6  --   --    Liver Function Tests: Recent Labs  Lab 09/07/17 0158 09/08/17 0220 09/09/17 0352 09/10/17 1835  AST  --   --   --  18  ALT  --   --   --  11*  ALKPHOS  --   --   --  57  BILITOT  --   --   --  0.4  PROT  --   --   --  5.2*  ALBUMIN 1.8* 1.6* 1.7* 1.9*   No results for input(s): LIPASE, AMYLASE in the last 168 hours. No results for input(s): AMMONIA in the last 168 hours. CBC: Recent Labs  Lab 09/08/17 0220 09/09/17 1119 09/11/17 0314 09/12/17 1223 09/13/17 0446  WBC 13.6* 12.7* 14.0* 18.7* 17.4*  NEUTROABS 9.6* 9.5*  --   --   --   HGB 10.2* 11.1* 10.8* 11.4* 10.3*  HCT 31.2* 34.4* 32.5* 34.7* 32.1*  MCV 92.0 91.7 92.1 92.8 92.8  PLT 355 440* 466* 488* 457*   Cardiac Enzymes: No results for input(s): CKTOTAL, CKMB, CKMBINDEX, TROPONINI in the last 168 hours. BNP (last 3 results) Recent Labs    09/01/17 1807 09/09/17 1119  BNP 275.0* 176.7*    ProBNP (last 3 results) No results for input(s): PROBNP in the last 8760 hours.  CBG: Recent Labs  Lab 09/09/17 1620 09/09/17 2004 09/10/17 0013 09/10/17 0403 09/10/17 1110  GLUCAP 121* 108* 124* 129* 151*    Recent Results (from the past 240 hour(s))  Culture, body fluid-bottle     Status: None (Preliminary result)   Collection Time: 09/10/17  3:50 PM  Result Value Ref Range Status   Specimen Description FLUID PLEURAL LEFT  Final   Special Requests BOTTLES DRAWN AEROBIC AND ANAEROBIC  Final   Culture NO GROWTH 3 DAYS  Final   Report Status PENDING  Incomplete  Gram stain     Status: None   Collection Time: 09/10/17  3:50 PM  Result Value  Ref Range Status   Specimen Description FLUID PLEURAL LEFT  Final   Special Requests NONE  Final   Gram Stain   Final    FEW WBC PRESENT, PREDOMINANTLY MONONUCLEAR NO ORGANISMS SEEN    Report Status 09/10/2017 FINAL  Final     Studies: Dg Chest 2 View  Result Date: 09/12/2017 CLINICAL DATA:  Leukocytosis EXAM: CHEST  2 VIEW COMPARISON:  09/10/2017 FINDINGS: The heart is moderately enlarged. Large hiatal hernia is stable. Small bilateral pleural effusions and bibasilar atelectasis. No pneumothorax. IMPRESSION: Small bilateral pleural effusions with bibasilar atelectasis.  Cardiomegaly. Electronically Signed   By: Jolaine Click M.D.   On: 09/12/2017 19:47    Scheduled Meds: . diltiazem  30 mg Oral TID  . feeding supplement (ENSURE ENLIVE)  237 mL Oral BID BM  . metoprolol tartrate  25 mg Oral BID  . pantoprazole  40 mg Oral BID   Continuous Infusions: . piperacillin-tazobactam (ZOSYN)  IV 3.375 g (09/13/17 0525)    Principal Problem:   Sepsis (HCC) Active Problems:   Meniere's disease   Anxiety   Essential hypertension   GERD (gastroesophageal reflux disease)   Hiatal hernia   AKI (acute kidney injury) (HCC)   GI bleed   Hematemesis   Melena   Hypokalemia   Leukocytosis   Elevated lactic acid level   Intractable nausea and vomiting   Tachycardia   Palliative care by specialist   Goals of care, counseling/discussion   Pleural effusion   At risk for aspiration   Protein-calorie malnutrition (HCC)    Time spent: >35 minutes     Belkys A Regalado  [facility] Pager [phone]. If 7PM-7AM, please contact night-coverage at [email or web address], password [facility] 09/13/2017, 9:47 AM  LOS: 12 days

## 2017-09-14 LAB — URINALYSIS, ROUTINE W REFLEX MICROSCOPIC
BILIRUBIN URINE: NEGATIVE
Glucose, UA: NEGATIVE mg/dL
KETONES UR: NEGATIVE mg/dL
LEUKOCYTES UA: NEGATIVE
NITRITE: NEGATIVE
PH: 7 (ref 5.0–8.0)
Protein, ur: NEGATIVE mg/dL
SPECIFIC GRAVITY, URINE: 1.017 (ref 1.005–1.030)

## 2017-09-14 LAB — CBC
HCT: 33.5 % — ABNORMAL LOW (ref 36.0–46.0)
Hemoglobin: 10.5 g/dL — ABNORMAL LOW (ref 12.0–15.0)
MCH: 28.8 pg (ref 26.0–34.0)
MCHC: 31.3 g/dL (ref 30.0–36.0)
MCV: 92 fL (ref 78.0–100.0)
PLATELETS: 447 10*3/uL — AB (ref 150–400)
RBC: 3.64 MIL/uL — ABNORMAL LOW (ref 3.87–5.11)
RDW: 16.1 % — AB (ref 11.5–15.5)
WBC: 15.8 10*3/uL — AB (ref 4.0–10.5)

## 2017-09-14 MED ORDER — AMOXICILLIN-POT CLAVULANATE 875-125 MG PO TABS: 1.0000 | ORAL_TABLET | Freq: Two times a day (BID) | ORAL | 0 refills | Status: AC

## 2017-09-14 MED ORDER — HYDROCODONE-ACETAMINOPHEN 5-325 MG PO TABS: 1.0000 | ORAL_TABLET | Freq: Three times a day (TID) | ORAL | 0 refills | Status: DC | PRN

## 2017-09-14 MED ORDER — ENSURE ENLIVE PO LIQD: 237.0000 mL | Freq: Two times a day (BID) | ORAL | 12 refills | Status: AC

## 2017-09-14 MED ORDER — SUCRALFATE 1 G PO TABS: 1.0000 g | ORAL_TABLET | Freq: Four times a day (QID) | ORAL | 1 refills | Status: DC

## 2017-09-14 MED ORDER — PANTOPRAZOLE SODIUM 40 MG PO TBEC: 40.0000 mg | DELAYED_RELEASE_TABLET | Freq: Two times a day (BID) | ORAL | 0 refills | Status: AC

## 2017-09-14 NOTE — Discharge Summary (Signed)
Physician Discharge Summary  Nicole Key ZOX:[id number] DOB: [birth year] DOA: 09/01/2017  PCP: Geoffry ParadiseAronson, Richard, MD  Admit date: 09/01/2017 Discharge date: 09/14/2017  Admitted From: Home  Disposition: Home   Recommendations for Outpatient Follow-up:  1. Follow up with PCP in 1-2 weeks 2. Please obtain BMP/CBC in one week 3. Repeat cbc to follow leukocytosis.  4. Needs follow up CT chest depending on goals of care.  5. Recommend referral to palliative care   Home Health: yes  Discharge Condition: stable.  CODE STATUS: partial code.  Diet recommendation: Heart Healthy   Brief/Interim Summary:  Brief summary   84 y.o.femalewith past medical history significantforlarge hiatal hernia,incarcerated hiatal hernia with obstruction, aspiration pneumonia, hypertension, fibromyalgia, anxiety, GERD, and malnutrition. Patient was admitted with sepsis secondary to PossiblePNA and colitis. GI bleed was documented, evaluated by GI who recommended supportivecare. Patienthascontinued to be generally weak, requiring supplemental oxygen. Last ECHO was in 2014.  Patient insists on being discharged back homewhen optimized despite recommendation of brief rehab versus higher level of care. Patient understands, and voices that her prognosis is guarded, and would like to spend time with her family at home. plan for Home with Home Health PT/Nursing/Aide. At some point, depending on patient's progress, follow up at home may have to be transitioned to Palliative care team/Hospice.  Overall, prognosis is guarded. Remains severely deconditioned. Patient today with increase leukocytosis, will restart antibiotic and repeat chest x ray.    Assessment/Plan:  Sepsis; PNA vs colitis. Presents with leukocytosis , lactic acidosis. WBC trending down. 34---12.  Lactic acid decrease from 12---4.9 -improved. Completed course of antibiotics. -Patient today with worsening leukocytosis, she is at risk for  aspiration due to large hiatal hernia.  -Restarted  IV zosyn 1-16, repeated chest x ray no loculated effusion. wbc decreased to 15. Patient medically stable, no hypoxemia, afebrile. She will be discharge on Augmentin for 5 more days. She is at risk of recurrent infection, aspiriation due to hiatal hernia.    Leukocytosis; suspect related to recurrent PNA, aspiration.  LFT normal.  Pleural fluid, no growth to date.  ua negative for infection.  Follow trend. Trending down.  Started on zosyn. she will be discharge on Augmentin for 5 days.   Acute hypoxic respiratory failure; in setting PNA. Aspiration. Guarded prognosis -treated with zosyn. Blood culture: NGTD.  Incentive spirometry. Improved. Echo: LVEF 60% -large symptomatic pleural effusion on CT. Underwent US thoracentesis on 11/14. Gram stain is negative, preilim cultures: NGTD Oxygen sat 95 % RA  GI bleed, hematemesis. Transfused one unit PRBC 1-06 and 1-07. OnProtonix.  -Hg remains stable   GERD; will provide prescription for Protonix. Also will give prescription for PRN Carafate.   Hypomagnesemia/Hypokalemia and Hypophosphatemia - Repleted   Acute Encephalopathy: Resolved.  Colitis: C diff negative. GI pathogen negative. Antidiarrheal agent, Support care, IV fluids antibiotics,  -Improved   AKI; in setting of sepsis, hypovolemia. Cr peak to 1.5. Improved with IV fluids.   HTN; Restart home meds. PRN Hydralazine.   Fibromyalgia. Appears stable at baseline.   Hypoalbuminemia: Possible indicative of guarded prognosis versus severe malnutrition.  Prognosis is guarded, patient with severe deconditioning. Refused to go to SNF.      Discharge Diagnoses:  Principal Problem:   Sepsis (HCC) Active Problems:   Meniere's disease   Anxiety   Essential hypertension   GERD (gastroesophageal reflux disease)   Hiatal hernia   AKI (acute kidney injury) (HCC)   GI bleed   Hematemesis   Melena  Hypokalemia    Leukocytosis   Elevated lactic acid level   Intractable nausea and vomiting   Tachycardia   Palliative care by specialist   Goals of care, counseling/discussion   Pleural effusion   At risk for aspiration   Protein-calorie malnutrition [facility])    Discharge Instructions  Discharge Instructions    Diet - low sodium heart healthy   Complete by:  As directed    Increase activity slowly   Complete by:  As directed      Allergies as of 09/14/2017      Reactions   Shellfish Allergy Hives      Medication List    STOP taking these medications   traMADol 50 MG tablet Commonly known as:  ULTRAM     TAKE these medications   acetaminophen 325 MG tablet Commonly known as:  TYLENOL Take 325-650 mg by mouth every 6 (six) hours as needed for mild pain.   ALPRAZolam 0.25 MG tablet Commonly known as:  XANAX Take 1 tablet (0.25 mg total) by mouth at bedtime as needed (anxiety). What changed:    when to take this  reasons to take this   amoxicillin-clavulanate 875-125 MG tablet Commonly known as:  AUGMENTIN Take 1 tablet by mouth 2 (two) times daily for 5 days.   ASPERCREME LIDOCAINE EX Apply 1 application topically 3 (three) times daily.   diltiazem 30 MG tablet Commonly known as:  CARDIZEM Take 1 tablet (30 mg total) by mouth 3 (three) times daily.   feeding supplement (ENSURE ENLIVE) Liqd Take 237 mLs by mouth 2 (two) times daily between meals.   HYDROcodone-acetaminophen 5-325 MG tablet Commonly known as:  NORCO/VICODIN Take 1 tablet by mouth every 8 (eight) hours as needed for moderate pain. What changed:  when to take this   hyoscyamine 0.125 MG tablet Commonly known as:  LEVSIN, ANASPAZ Take 0.125 mg by mouth every 4 (four) hours as needed for bladder spasms or cramping.   meclizine 25 MG tablet Commonly known as:  ANTIVERT Take 25 mg by mouth 3 (three) times daily as needed for dizziness.   metoprolol tartrate 25 MG tablet Commonly known as:  LOPRESSOR Take  1 tablet (25 mg total) by mouth 2 (two) times daily.   pantoprazole 40 MG tablet Commonly known as:  PROTONIX Take 1 tablet (40 mg total) by mouth 2 (two) times daily before a meal.   promethazine 25 MG tablet Commonly known as:  PHENERGAN Take 25 mg by mouth every 8 (eight) hours as needed for nausea/vomiting.   sucralfate 1 g tablet Commonly known as:  CARAFATE Take 1 tablet (1 g total) by mouth 4 (four) times daily.   traZODone 50 MG tablet Commonly known as:  DESYREL Take 1 tablet (50 mg total) by mouth at bedtime as needed for sleep.   VITAMIN B 12 PO Take 1 tablet by mouth daily.       Allergies  Allergen Reactions  . Shellfish Allergy Hives    Consultations:  Palliative    Procedures/Studies: Ct Abdomen Pelvis Wo Contrast  Result Date: 09/01/2017 CLINICAL DATA:  84 year old female with diffuse abdominal and pelvic pain and vomiting for 1 day. EXAM: CT ABDOMEN AND PELVIS WITHOUT CONTRAST TECHNIQUE: Multidetector CT imaging of the abdomen and pelvis was performed following the standard protocol without IV contrast. COMPARISON:  02/04/2015 and prior CTs FINDINGS: Please note that parenchymal abnormalities may be missed without intravenous contrast. Lower chest: No acute abnormality. Bibasilar scarring noted. A very large hiatal  hernia is again identified. Hepatobiliary: No significant hepatic abnormalities. A small amount of gallbladder sludge versus cholelithiasis noted without CT evidence of acute cholecystitis. No biliary dilatation. Pancreas: Atrophic without other significant abnormality Spleen: Unremarkable Adrenals/Urinary Tract: Gas within the bladder wall is noted likely representing emphysematous cystitis. There is no evidence of a gas within the kidneys or renal collecting systems. No hydronephrosis. The kidneys and adrenal glands are unremarkable. Stomach/Bowel: Diffuse circumferential wall thickening of the descending, sigmoid and distal transverse colon noted  compatible with a colitis. There is no evidence of bowel obstruction or pneumoperitoneum. Diffuse colonic diverticulosis noted. Vascular/Lymphatic: Aortic atherosclerosis. No enlarged abdominal or pelvic lymph nodes. Reproductive: Status post hysterectomy. No adnexal masses. Other: No ascites or focal collection. Musculoskeletal: A 50% superior endplate compression fracture of L3 is identified which is new since 2016 but does not appear acute. Mild bony retropulsion is noted. IMPRESSION: 1. Diffuse wall thickening of the descending, sigmoid and distal transverse colon compatible with colitis. No bowel obstruction or pneumoperitoneum. 2. Gas within the bladder wall likely representing emphysematous cystitis. No evidence of gas within the kidneys or renal collecting systems. 3. 50% L3 compression fracture -new since 2016 but does not appear acute. Correlate clinically. 4. Gallbladder sludge versus cholelithiasis. No CT evidence of acute cholecystitis 5.  Aortic Atherosclerosis (ICD10-I70.0). Electronically Signed   By: Harmon Pier M.D.   On: 09/01/2017 11:06   Dg Chest 1 View  Result Date: 09/10/2017 CLINICAL DATA:  Status post thoracentesis EXAM: CHEST 1 VIEW COMPARISON:  CT chest 09/07/2017 FINDINGS: Small right pleural effusion. Mild bilateral interstitial prominence. Small left pleural effusion. No pneumothorax. Stable cardiomegaly. No acute osseous abnormality. IMPRESSION: Small right pleural effusion.  No pneumothorax. Electronically Signed   By: Elige Ko   On: 09/10/2017 15:51   Dg Chest 2 View  Result Date: 09/12/2017 CLINICAL DATA:  Leukocytosis EXAM: CHEST  2 VIEW COMPARISON:  09/10/2017 FINDINGS: The heart is moderately enlarged. Large hiatal hernia is stable. Small bilateral pleural effusions and bibasilar atelectasis. No pneumothorax. IMPRESSION: Small bilateral pleural effusions with bibasilar atelectasis. Cardiomegaly. Electronically Signed   By: Jolaine Click M.D.   On: 09/12/2017 19:47    Ct Chest Wo Contrast  Result Date: 09/07/2017 CLINICAL DATA:  Hypoxic respiratory failure. Aspiration. Improving. Pneumonia. EXAM: CT CHEST WITHOUT CONTRAST TECHNIQUE: Multidetector CT imaging of the chest was performed following the standard protocol without IV contrast. COMPARISON:  09/02/2017 plain film. Most recent chest CT of 02/04/2015. FINDINGS: Cardiovascular: Mild motion and EKG lead/wire artifact. Aortic atherosclerosis. Tortuous thoracic aorta. Mild cardiomegaly, without pericardial effusion. Mediastinum/Nodes: Precarinal node is similar at 10 mm image 52/series 4. Hilar regions poorly evaluated without intravenous contrast. large hiatal hernia. Lungs/Pleura: Moderate left and small right pleural effusions. Compression of the left lower lobe endobronchial tree persists. new compression of the right lower lobe endobronchial tree. Mild centrilobular emphysema. Collapse/consolidative change in both lung bases, worse on the left. Upper Abdomen: Normal imaged portions of the liver, spleen, pancreas, adrenal glands, kidneys. Musculoskeletal: Osteopenia.  Remote lower left rib fractures. IMPRESSION: 1. Multifactorial degradation. 2. Left larger than right pleural effusions. 3. Left greater than right base airspace disease. Suspicious for infection or aspiration. Especially given apparent compression/narrowing of endobronchial tree to both lower lobes, recommend radiographic follow-up until clearing and consideration of CT follow-up. 4.  Aortic Atherosclerosis (ICD10-I70.0). 5. large hiatal hernia. Electronically Signed   By: Jeronimo Greaves M.D.   On: 09/07/2017 19:57   Dg Chest Port 1 View  Result Date:  09/02/2017 CLINICAL DATA:  Cough and chest pain EXAM: PORTABLE CHEST 1 VIEW COMPARISON:  09/01/2017 and prior exams FINDINGS: Cardiomegaly, large hiatal hernia and left basilar atelectasis again noted. No pneumothorax or significant changes are noted since the prior study. No acute bony abnormalities are  present. IMPRESSION: Unchanged appearance the chest with cardiomegaly, large hiatal hernia and left basilar atelectasis. Electronically Signed   By: Harmon Pier M.D.   On: 09/02/2017 11:18   Dg Chest Port 1 View  Result Date: 09/01/2017 CLINICAL DATA:  Vomiting blood. EXAM: PORTABLE CHEST 1 VIEW COMPARISON:  Chest radiograph 09/01/2017. FINDINGS: Monitoring leads overlie the patient. Stable cardiac and mediastinal contours with tortuosity of the thoracic aorta. Large hiatal hernia. Heterogeneous opacities left lung base. Probable small left pleural effusion. IMPRESSION: Heterogeneous opacities left lung base may represent atelectasis or infection. Possible small left pleural effusion. Large hiatal hernia. Electronically Signed   By: Annia Belt M.D.   On: 09/01/2017 17:05   Dg Chest Port 1 View  Result Date: 09/01/2017 CLINICAL DATA:  Patient with vomiting. EXAM: PORTABLE CHEST 1 VIEW COMPARISON:  Chest radiograph 03/08/2017. FINDINGS: Monitoring leads overlie the patient. Stable cardiac and mediastinal contours. Thoracic aortic vascular calcifications. Large hiatal hernia. No consolidative pulmonary opacities. No pleural effusion or pneumothorax. IMPRESSION: No acute cardiopulmonary process.  Large hiatal hernia. Electronically Signed   By: Annia Belt M.D.   On: 09/01/2017 08:59   Ir Thoracentesis Asp Pleural Space W/img Guide  Result Date: 09/10/2017 INDICATION: Possible pneumonia with left-sided pleural effusion noted on recent CT imaging. Request is made for diagnostic and therapeutic thoracentesis. EXAM: ULTRASOUND GUIDED DIAGNOSTIC AND THERAPEUTIC THORACENTESIS MEDICATIONS: 2% lidocaine COMPLICATIONS: None immediate. PROCEDURE: An ultrasound guided thoracentesis was thoroughly discussed with the patient and questions answered. The benefits, risks, alternatives and complications were also discussed. The patient understands and wishes to proceed with the procedure. Written consent was obtained.  Ultrasound was performed to localize and mark an adequate pocket of fluid in the left chest. The area was then prepped and draped in the normal sterile fashion. 2% Lidocaine was used for local anesthesia. Under ultrasound guidance a Safe-T-Centesis catheter was introduced. Thoracentesis was performed. The catheter was removed and a dressing applied. FINDINGS: A total of approximately 0.2 L of serous fluid was removed. Samples were sent to the laboratory as requested by the clinical team. IMPRESSION: Successful ultrasound guided left thoracentesis yielding 0.2 L of pleural fluid. Read by: Barnetta Chapel, PA-C No pneumothorax on follow-up radiograph. Electronically Signed   By: Corlis Leak M.D.   On: 09/10/2017 15:48      Subjective: She is eating lunch. Gets at time mild epigastric pain, she has had that same pain at home, antiacids help/  Report mild cough   Discharge Exam: Vitals:   09/14/17 1109 09/14/17 1152  BP: 138/73 138/73  Pulse:  92  Resp:  17  Temp:    SpO2:  95%   Vitals:   09/14/17 0852 09/14/17 1014 09/14/17 1109 09/14/17 1152  BP: (!) 149/74 (!) 145/74 138/73 138/73  Pulse: 93 93  92  Resp: (!) 21 (!) 22  17  Temp:  98.6 F (37 C)    TempSrc:  Oral    SpO2: 94%   95%  Weight:      Height:        General: Pt is alert, awake, not in acute distress Cardiovascular: RRR, S1/S2 +, no rubs, no gallops Respiratory: CTA bilaterally, no wheezing, no rhonchi Abdominal: Soft, NT, ND,  bowel sounds + Extremities: no edema, no cyanosis    The results of significant diagnostics from this hospitalization (including imaging, microbiology, ancillary and laboratory) are listed below for reference.     Microbiology: Recent Results (from the past 240 hour(s))  Culture, body fluid-bottle     Status: None (Preliminary result)   Collection Time: 09/10/17  3:50 PM  Result Value Ref Range Status   Specimen Description FLUID PLEURAL LEFT  Final   Special Requests BOTTLES DRAWN AEROBIC  AND ANAEROBIC  Final   Culture NO GROWTH 4 DAYS  Final   Report Status PENDING  Incomplete  Gram stain     Status: None   Collection Time: 09/10/17  3:50 PM  Result Value Ref Range Status   Specimen Description FLUID PLEURAL LEFT  Final   Special Requests NONE  Final   Gram Stain   Final    FEW WBC PRESENT, PREDOMINANTLY MONONUCLEAR NO ORGANISMS SEEN    Report Status 09/10/2017 FINAL  Final     Labs: BNP (last 3 results) Recent Labs    09/01/17 1807 09/09/17 1119  BNP 275.0* 176.7*   Basic Metabolic Panel: Recent Labs  Lab 09/07/17 1837 09/08/17 0220 09/09/17 0352 09/10/17 1835 09/13/17 0446  NA  --  138 138 137 139  K  --  3.6 4.0 3.6 3.7  CL  --  108 105 103 103  CO2  --  26 26 25 27   GLUCOSE  --  113* 111* 131* 106*  BUN  --  <5* <5* 5* 10  CREATININE  --  0.51 0.50 0.59 0.65  CALCIUM  --  7.5* 7.7* 7.9* 8.2*  MG 1.9  --  2.0  --   --   PHOS 2.7 2.5  2.3* 2.6  --   --    Liver Function Tests: Recent Labs  Lab 09/08/17 0220 09/09/17 0352 09/10/17 1835  AST  --   --  18  ALT  --   --  11*  ALKPHOS  --   --  57  BILITOT  --   --  0.4  PROT  --   --  5.2*  ALBUMIN 1.6* 1.7* 1.9*   No results for input(s): LIPASE, AMYLASE in the last 168 hours. No results for input(s): AMMONIA in the last 168 hours. CBC: Recent Labs  Lab 09/08/17 0220 09/09/17 1119 09/11/17 0314 09/12/17 1223 09/13/17 0446 09/14/17 0814  WBC 13.6* 12.7* 14.0* 18.7* 17.4* 15.8*  NEUTROABS 9.6* 9.5*  --   --   --   --   HGB 10.2* 11.1* 10.8* 11.4* 10.3* 10.5*  HCT 31.2* 34.4* 32.5* 34.7* 32.1* 33.5*  MCV 92.0 91.7 92.1 92.8 92.8 92.0  PLT 355 440* 466* 488* 457* 447*   Cardiac Enzymes: No results for input(s): CKTOTAL, CKMB, CKMBINDEX, TROPONINI in the last 168 hours. BNP: Invalid input(s): POCBNP CBG: Recent Labs  Lab 09/09/17 1620 09/09/17 2004 09/10/17 0013 09/10/17 0403 09/10/17 1110  GLUCAP 121* 108* 124* 129* 151*   D-Dimer No results for input(s): DDIMER in  the last 72 hours. Hgb A1c No results for input(s): HGBA1C in the last 72 hours. Lipid Profile No results for input(s): CHOL, HDL, LDLCALC, TRIG, CHOLHDL, LDLDIRECT in the last 72 hours. Thyroid function studies No results for input(s): TSH, T4TOTAL, T3FREE, THYROIDAB in the last 72 hours.  Invalid input(s): FREET3 Anemia work up No results for input(s): VITAMINB12, FOLATE, FERRITIN, TIBC, IRON, RETICCTPCT in the last 72 hours. Urinalysis    Component  Value Date/Time   COLORURINE YELLOW 09/14/2017 0453   APPEARANCEUR CLOUDY (A) 09/14/2017 0453   LABSPEC 1.017 09/14/2017 0453   PHURINE 7.0 09/14/2017 0453   GLUCOSEU NEGATIVE 09/14/2017 0453   HGBUR SMALL (A) 09/14/2017 0453   BILIRUBINUR NEGATIVE 09/14/2017 0453   KETONESUR NEGATIVE 09/14/2017 0453   PROTEINUR NEGATIVE 09/14/2017 0453   UROBILINOGEN 0.2 02/03/2015 1928   NITRITE NEGATIVE 09/14/2017 0453   LEUKOCYTESUR NEGATIVE 09/14/2017 0453   Sepsis Labs Invalid input(s): PROCALCITONIN,  WBC,  LACTICIDVEN Microbiology Recent Results (from the past 240 hour(s))  Culture, body fluid-bottle     Status: None (Preliminary result)   Collection Time: 09/10/17  3:50 PM  Result Value Ref Range Status   Specimen Description FLUID PLEURAL LEFT  Final   Special Requests BOTTLES DRAWN AEROBIC AND ANAEROBIC  Final   Culture NO GROWTH 4 DAYS  Final   Report Status PENDING  Incomplete  Gram stain     Status: None   Collection Time: 09/10/17  3:50 PM  Result Value Ref Range Status   Specimen Description FLUID PLEURAL LEFT  Final   Special Requests NONE  Final   Gram Stain   Final    FEW WBC PRESENT, PREDOMINANTLY MONONUCLEAR NO ORGANISMS SEEN    Report Status 09/10/2017 FINAL  Final     Time coordinating discharge: Over 30 minutes  SIGNED:   Alba Cory, MD  [facility] 09/14/2017, 12:19 PM Pager   If 7PM-7AM, please contact night-coverage [email or web address] Password [email or web address]

## 2017-09-14 NOTE — Progress Notes (Signed)
09/14/2017 2:47 PM Discharge AVS meds taken today and those due this evening reviewed.  Follow-up appointments and when to call md reviewed.  D/C IV and TELE.  Questions and concerns addressed.   D/C home per orders. Kathryne HitchAllen, Synthia Fairbank C

## 2017-09-14 NOTE — Progress Notes (Signed)
Ver[facility] w Davonna BellingMary Yonjoff [facility] that [facility]. No other CM needs. [facility]

## 2017-09-14 NOTE — Discharge Instructions (Signed)
Do not go to bed after eating. Wait at least  3 hours to go to bed after eating.

## 2017-09-15 LAB — CULTURE, BODY FLUID W GRAM STAIN -BOTTLE

## 2017-09-15 LAB — CULTURE, BODY FLUID-BOTTLE: CULTURE: NO GROWTH

## 2017-12-31 ENCOUNTER — Inpatient Hospital Stay (HOSPITAL_COMMUNITY)
Admission: EM | Admit: 2017-12-31 | Discharge: 2018-01-09 | DRG: 871 | Disposition: A | Discharge status: Hospice | Payer: Medicare Other | Attending: Internal Medicine | Admitting: Internal Medicine

## 2017-12-31 ENCOUNTER — Encounter (HOSPITAL_COMMUNITY): Payer: Self-pay

## 2017-12-31 DIAGNOSIS — Z9049 Acquired absence of other specified parts of digestive tract: Secondary | ICD-10-CM

## 2017-12-31 DIAGNOSIS — J690 Pneumonitis due to inhalation of food and vomit: Secondary | ICD-10-CM | POA: Diagnosis present

## 2017-12-31 DIAGNOSIS — M797 Fibromyalgia: Secondary | ICD-10-CM | POA: Diagnosis present

## 2017-12-31 DIAGNOSIS — A419 Sepsis, unspecified organism: Secondary | ICD-10-CM | POA: Diagnosis not present

## 2017-12-31 DIAGNOSIS — M25562 Pain in left knee: Secondary | ICD-10-CM

## 2017-12-31 DIAGNOSIS — K219 Gastro-esophageal reflux disease without esophagitis: Secondary | ICD-10-CM | POA: Diagnosis present

## 2017-12-31 DIAGNOSIS — I10 Essential (primary) hypertension: Secondary | ICD-10-CM | POA: Diagnosis present

## 2017-12-31 DIAGNOSIS — R627 Adult failure to thrive: Secondary | ICD-10-CM | POA: Diagnosis present

## 2017-12-31 DIAGNOSIS — Z8249 Family history of ischemic heart disease and other diseases of the circulatory system: Secondary | ICD-10-CM

## 2017-12-31 DIAGNOSIS — Z90710 Acquired absence of both cervix and uterus: Secondary | ICD-10-CM

## 2017-12-31 DIAGNOSIS — D649 Anemia, unspecified: Secondary | ICD-10-CM | POA: Diagnosis present

## 2017-12-31 DIAGNOSIS — Z801 Family history of malignant neoplasm of trachea, bronchus and lung: Secondary | ICD-10-CM

## 2017-12-31 DIAGNOSIS — N390 Urinary tract infection, site not specified: Secondary | ICD-10-CM | POA: Diagnosis present

## 2017-12-31 DIAGNOSIS — M25561 Pain in right knee: Secondary | ICD-10-CM

## 2017-12-31 DIAGNOSIS — H8109 Meniere's disease, unspecified ear: Secondary | ICD-10-CM | POA: Diagnosis present

## 2017-12-31 DIAGNOSIS — Z9013 Acquired absence of bilateral breasts and nipples: Secondary | ICD-10-CM

## 2017-12-31 DIAGNOSIS — J189 Pneumonia, unspecified organism: Secondary | ICD-10-CM

## 2017-12-31 DIAGNOSIS — Z91013 Allergy to seafood: Secondary | ICD-10-CM

## 2017-12-31 DIAGNOSIS — M1990 Unspecified osteoarthritis, unspecified site: Secondary | ICD-10-CM | POA: Diagnosis present

## 2017-12-31 DIAGNOSIS — Z515 Encounter for palliative care: Secondary | ICD-10-CM | POA: Diagnosis not present

## 2017-12-31 DIAGNOSIS — K449 Diaphragmatic hernia without obstruction or gangrene: Secondary | ICD-10-CM | POA: Diagnosis present

## 2017-12-31 DIAGNOSIS — E46 Unspecified protein-calorie malnutrition: Secondary | ICD-10-CM | POA: Diagnosis present

## 2017-12-31 DIAGNOSIS — G8929 Other chronic pain: Secondary | ICD-10-CM | POA: Diagnosis present

## 2017-12-31 DIAGNOSIS — M542 Cervicalgia: Secondary | ICD-10-CM | POA: Diagnosis present

## 2017-12-31 DIAGNOSIS — M25512 Pain in left shoulder: Secondary | ICD-10-CM | POA: Diagnosis present

## 2017-12-31 DIAGNOSIS — E43 Unspecified severe protein-calorie malnutrition: Secondary | ICD-10-CM | POA: Diagnosis present

## 2017-12-31 DIAGNOSIS — J9601 Acute respiratory failure with hypoxia: Secondary | ICD-10-CM | POA: Diagnosis present

## 2017-12-31 DIAGNOSIS — F419 Anxiety disorder, unspecified: Secondary | ICD-10-CM | POA: Diagnosis present

## 2017-12-31 DIAGNOSIS — M25511 Pain in right shoulder: Secondary | ICD-10-CM | POA: Diagnosis present

## 2017-12-31 DIAGNOSIS — Z66 Do not resuscitate: Secondary | ICD-10-CM | POA: Diagnosis not present

## 2017-12-31 DIAGNOSIS — E876 Hypokalemia: Secondary | ICD-10-CM | POA: Diagnosis present

## 2017-12-31 DIAGNOSIS — Z681 Body mass index (BMI) 19 or less, adult: Secondary | ICD-10-CM

## 2017-12-31 DIAGNOSIS — R64 Cachexia: Secondary | ICD-10-CM | POA: Diagnosis present

## 2017-12-31 LAB — LIPASE, BLOOD: LIPASE: 21 U/L (ref 11–51)

## 2017-12-31 LAB — CBC
HEMATOCRIT: 41.2 % (ref 36.0–46.0)
Hemoglobin: 13.3 g/dL (ref 12.0–15.0)
MCH: 28.2 pg (ref 26.0–34.0)
MCHC: 32.3 g/dL (ref 30.0–36.0)
MCV: 87.5 fL (ref 78.0–100.0)
Platelets: 393 10*3/uL (ref 150–400)
RBC: 4.71 MIL/uL (ref 3.87–5.11)
RDW: 15.8 % — ABNORMAL HIGH (ref 11.5–15.5)
WBC: 20.2 10*3/uL — AB (ref 4.0–10.5)

## 2017-12-31 LAB — COMPREHENSIVE METABOLIC PANEL
ALT: 10 U/L — ABNORMAL LOW (ref 14–54)
AST: 15 U/L (ref 15–41)
Albumin: 3.5 g/dL (ref 3.5–5.0)
Alkaline Phosphatase: 46 U/L (ref 38–126)
Anion gap: 14 (ref 5–15)
BUN: 17 mg/dL (ref 6–20)
CHLORIDE: 95 mmol/L — AB (ref 101–111)
CO2: 29 mmol/L (ref 22–32)
Calcium: 9.3 mg/dL (ref 8.9–10.3)
Creatinine, Ser: 0.71 mg/dL (ref 0.44–1.00)
Glucose, Bld: 120 mg/dL — ABNORMAL HIGH (ref 65–99)
POTASSIUM: 3.1 mmol/L — AB (ref 3.5–5.1)
Sodium: 138 mmol/L (ref 135–145)
Total Bilirubin: 0.7 mg/dL (ref 0.3–1.2)
Total Protein: 7.8 g/dL (ref 6.5–8.1)

## 2017-12-31 NOTE — ED Triage Notes (Signed)
Pt states that she has been vomiting since yesterday appx about 8-10 times with abd pain, c/o of dysuria and placed on antibiotics for UTI.

## 2018-01-01 ENCOUNTER — Other Ambulatory Visit: Payer: Self-pay

## 2018-01-01 ENCOUNTER — Encounter (HOSPITAL_COMMUNITY): Payer: Self-pay | Admitting: Nurse Practitioner

## 2018-01-01 ENCOUNTER — Inpatient Hospital Stay (HOSPITAL_COMMUNITY): Payer: Medicare Other

## 2018-01-01 ENCOUNTER — Emergency Department (HOSPITAL_COMMUNITY): Payer: Medicare Other

## 2018-01-01 DIAGNOSIS — H8109 Meniere's disease, unspecified ear: Secondary | ICD-10-CM | POA: Diagnosis present

## 2018-01-01 DIAGNOSIS — R64 Cachexia: Secondary | ICD-10-CM | POA: Diagnosis present

## 2018-01-01 DIAGNOSIS — Z515 Encounter for palliative care: Secondary | ICD-10-CM | POA: Diagnosis not present

## 2018-01-01 DIAGNOSIS — M25561 Pain in right knee: Secondary | ICD-10-CM | POA: Diagnosis not present

## 2018-01-01 DIAGNOSIS — K219 Gastro-esophageal reflux disease without esophagitis: Secondary | ICD-10-CM | POA: Diagnosis present

## 2018-01-01 DIAGNOSIS — Z681 Body mass index (BMI) 19 or less, adult: Secondary | ICD-10-CM | POA: Diagnosis not present

## 2018-01-01 DIAGNOSIS — Z66 Do not resuscitate: Secondary | ICD-10-CM | POA: Diagnosis not present

## 2018-01-01 DIAGNOSIS — F419 Anxiety disorder, unspecified: Secondary | ICD-10-CM | POA: Diagnosis present

## 2018-01-01 DIAGNOSIS — Z90710 Acquired absence of both cervix and uterus: Secondary | ICD-10-CM | POA: Diagnosis not present

## 2018-01-01 DIAGNOSIS — E876 Hypokalemia: Secondary | ICD-10-CM | POA: Diagnosis not present

## 2018-01-01 DIAGNOSIS — J690 Pneumonitis due to inhalation of food and vomit: Secondary | ICD-10-CM | POA: Diagnosis present

## 2018-01-01 DIAGNOSIS — J9601 Acute respiratory failure with hypoxia: Secondary | ICD-10-CM | POA: Diagnosis present

## 2018-01-01 DIAGNOSIS — D649 Anemia, unspecified: Secondary | ICD-10-CM | POA: Diagnosis present

## 2018-01-01 DIAGNOSIS — M1990 Unspecified osteoarthritis, unspecified site: Secondary | ICD-10-CM | POA: Diagnosis present

## 2018-01-01 DIAGNOSIS — M797 Fibromyalgia: Secondary | ICD-10-CM | POA: Diagnosis present

## 2018-01-01 DIAGNOSIS — I10 Essential (primary) hypertension: Secondary | ICD-10-CM | POA: Diagnosis present

## 2018-01-01 DIAGNOSIS — E46 Unspecified protein-calorie malnutrition: Secondary | ICD-10-CM | POA: Diagnosis present

## 2018-01-01 DIAGNOSIS — K449 Diaphragmatic hernia without obstruction or gangrene: Secondary | ICD-10-CM

## 2018-01-01 DIAGNOSIS — N390 Urinary tract infection, site not specified: Secondary | ICD-10-CM | POA: Diagnosis present

## 2018-01-01 DIAGNOSIS — R627 Adult failure to thrive: Secondary | ICD-10-CM | POA: Diagnosis present

## 2018-01-01 DIAGNOSIS — Z9013 Acquired absence of bilateral breasts and nipples: Secondary | ICD-10-CM | POA: Diagnosis not present

## 2018-01-01 DIAGNOSIS — J189 Pneumonia, unspecified organism: Secondary | ICD-10-CM | POA: Diagnosis not present

## 2018-01-01 DIAGNOSIS — A419 Sepsis, unspecified organism: Secondary | ICD-10-CM | POA: Diagnosis not present

## 2018-01-01 DIAGNOSIS — G8929 Other chronic pain: Secondary | ICD-10-CM | POA: Diagnosis present

## 2018-01-01 DIAGNOSIS — E43 Unspecified severe protein-calorie malnutrition: Secondary | ICD-10-CM | POA: Diagnosis present

## 2018-01-01 LAB — URINALYSIS, ROUTINE W REFLEX MICROSCOPIC
BILIRUBIN URINE: NEGATIVE
Glucose, UA: NEGATIVE mg/dL
KETONES UR: 5 mg/dL — AB
LEUKOCYTES UA: NEGATIVE
NITRITE: NEGATIVE
PH: 5 (ref 5.0–8.0)
Protein, ur: 30 mg/dL — AB

## 2018-01-01 LAB — CBC WITH DIFFERENTIAL/PLATELET
Basophils Absolute: 0 K/uL (ref 0.0–0.1)
Basophils Relative: 0 %
Eosinophils Absolute: 0 K/uL (ref 0.0–0.7)
Eosinophils Relative: 0 %
HCT: 40.7 % (ref 36.0–46.0)
Hemoglobin: 13.5 g/dL (ref 12.0–15.0)
Lymphocytes Relative: 7 %
Lymphs Abs: 1.7 K/uL (ref 0.7–4.0)
MCH: 28.9 pg (ref 26.0–34.0)
MCHC: 33.2 g/dL (ref 30.0–36.0)
MCV: 87.2 fL (ref 78.0–100.0)
Monocytes Absolute: 1.7 K/uL — ABNORMAL HIGH (ref 0.1–1.0)
Monocytes Relative: 7 %
Neutro Abs: 20.9 K/uL — ABNORMAL HIGH (ref 1.7–7.7)
Neutrophils Relative %: 86 %
Platelets: 383 K/uL (ref 150–400)
RBC: 4.67 MIL/uL (ref 3.87–5.11)
RDW: 15.8 % — ABNORMAL HIGH (ref 11.5–15.5)
WBC: 24.4 K/uL — ABNORMAL HIGH (ref 4.0–10.5)

## 2018-01-01 LAB — PHOSPHORUS: PHOSPHORUS: 3 mg/dL (ref 2.5–4.6)

## 2018-01-01 LAB — PROTIME-INR
INR: 1.13
Prothrombin Time: 14.4 seconds (ref 11.4–15.2)

## 2018-01-01 LAB — LACTIC ACID, PLASMA
LACTIC ACID, VENOUS: 2.5 mmol/L — AB (ref 0.5–1.9)
LACTIC ACID, VENOUS: 2.3 mmol/L — AB (ref 0.5–1.9)

## 2018-01-01 LAB — APTT: aPTT: 25 seconds (ref 24–36)

## 2018-01-01 LAB — I-STAT CG4 LACTIC ACID, ED
Lactic Acid, Venous: 3.25 mmol/L (ref 0.5–1.9)
Lactic Acid, Venous: 2.86 mmol/L (ref 0.5–1.9)

## 2018-01-01 LAB — PROCALCITONIN: Procalcitonin: 0.31 ng/mL

## 2018-01-01 LAB — MAGNESIUM: Magnesium: 1.5 mg/dL — ABNORMAL LOW (ref 1.7–2.4)

## 2018-01-01 LAB — MRSA PCR SCREENING: MRSA by PCR: NEGATIVE

## 2018-01-01 LAB — SEDIMENTATION RATE: Sed Rate: 23 mm/hr — ABNORMAL HIGH (ref 0–22)

## 2018-01-01 MED ORDER — PIPERACILLIN-TAZOBACTAM 3.375 G IVPB
3.3750 g | Freq: Three times a day (TID) | INTRAVENOUS | Status: DC
Start: 2018-01-01 — End: 2018-01-04
  Administered 2018-01-01 – 2018-01-04 (×9): 3.375 g via INTRAVENOUS
  Filled 2018-01-01 (×10): qty 50

## 2018-01-01 MED ORDER — SODIUM CHLORIDE 0.9 % IV SOLN
Freq: Once | INTRAVENOUS | Status: AC
  Administered 2018-01-01: 08:00:00 via INTRAVENOUS

## 2018-01-01 MED ORDER — SODIUM CHLORIDE 0.9% FLUSH
3.0000 mL | Freq: Two times a day (BID) | INTRAVENOUS | Status: DC
  Administered 2018-01-02 – 2018-01-09 (×13): 3 mL via INTRAVENOUS

## 2018-01-01 MED ORDER — ONDANSETRON HCL 4 MG/2ML IJ SOLN
4.0000 mg | Freq: Once | INTRAMUSCULAR | Status: AC
  Administered 2018-01-01: 4 mg via INTRAVENOUS
  Filled 2018-01-01: qty 2

## 2018-01-01 MED ORDER — ONDANSETRON HCL 4 MG/2ML IJ SOLN: 4.0000 mg | Freq: Four times a day (QID) | INTRAMUSCULAR | Status: DC | PRN

## 2018-01-01 MED ORDER — FAMOTIDINE IN NACL 20-0.9 MG/50ML-% IV SOLN
20.0000 mg | Freq: Once | INTRAVENOUS | Status: AC
  Administered 2018-01-01: 20 mg via INTRAVENOUS
  Filled 2018-01-01: qty 50

## 2018-01-01 MED ORDER — PROMETHAZINE HCL 25 MG/ML IJ SOLN
6.2500 mg | Freq: Once | INTRAMUSCULAR | Status: AC
  Administered 2018-01-01: 6.25 mg via INTRAVENOUS
  Filled 2018-01-01: qty 1

## 2018-01-01 MED ORDER — MAGNESIUM SULFATE 2 GM/50ML IV SOLN
2.0000 g | Freq: Once | INTRAVENOUS | Status: AC
  Administered 2018-01-01: 2 g via INTRAVENOUS
  Filled 2018-01-01: qty 50

## 2018-01-01 MED ORDER — PNEUMOCOCCAL VAC POLYVALENT 25 MCG/0.5ML IJ INJ
0.5000 mL | INJECTION | INTRAMUSCULAR | Status: DC
  Filled 2018-01-01: qty 0.5

## 2018-01-01 MED ORDER — ONDANSETRON HCL 4 MG PO TABS: 4.0000 mg | ORAL_TABLET | Freq: Four times a day (QID) | ORAL | Status: DC | PRN

## 2018-01-01 MED ORDER — ALPRAZOLAM 0.25 MG PO TABS
0.2500 mg | ORAL_TABLET | Freq: Two times a day (BID) | ORAL | Status: DC | PRN
  Administered 2018-01-02 – 2018-01-05 (×4): 0.25 mg via ORAL
  Filled 2018-01-01 (×4): qty 1

## 2018-01-01 MED ORDER — LEVALBUTEROL HCL 0.63 MG/3ML IN NEBU
0.6300 mg | INHALATION_SOLUTION | Freq: Once | RESPIRATORY_TRACT | Status: AC
  Administered 2018-01-01: 0.63 mg via RESPIRATORY_TRACT
  Filled 2018-01-01: qty 3

## 2018-01-01 MED ORDER — MORPHINE SULFATE (PF) 4 MG/ML IV SOLN
1.0000 mg | INTRAVENOUS | Status: DC | PRN
  Administered 2018-01-01 – 2018-01-02 (×2): 2 mg via INTRAVENOUS
  Administered 2018-01-02: 3 mg via INTRAVENOUS
  Administered 2018-01-02: 2 mg via INTRAVENOUS
  Filled 2018-01-01 (×4): qty 1

## 2018-01-01 MED ORDER — KETOROLAC TROMETHAMINE 15 MG/ML IJ SOLN: 15.0000 mg | Freq: Four times a day (QID) | INTRAMUSCULAR | Status: DC | PRN

## 2018-01-01 MED ORDER — SODIUM CHLORIDE 0.9 % IV BOLUS
1000.0000 mL | Freq: Once | INTRAVENOUS | Status: AC
  Administered 2018-01-01: 1000 mL via INTRAVENOUS

## 2018-01-01 MED ORDER — VANCOMYCIN HCL IN DEXTROSE 750-5 MG/150ML-% IV SOLN
750.0000 mg | INTRAVENOUS | Status: DC
  Administered 2018-01-02 – 2018-01-04 (×3): 750 mg via INTRAVENOUS
  Filled 2018-01-01 (×3): qty 150

## 2018-01-01 MED ORDER — ACETAMINOPHEN 325 MG PO TABS
650.0000 mg | ORAL_TABLET | Freq: Four times a day (QID) | ORAL | Status: DC | PRN
  Administered 2018-01-02 – 2018-01-03 (×2): 650 mg via ORAL
  Filled 2018-01-01 (×2): qty 2

## 2018-01-01 MED ORDER — FAMOTIDINE IN NACL 20-0.9 MG/50ML-% IV SOLN
20.0000 mg | INTRAVENOUS | Status: DC
  Administered 2018-01-02 – 2018-01-04 (×3): 20 mg via INTRAVENOUS
  Filled 2018-01-01 (×3): qty 50

## 2018-01-01 MED ORDER — POTASSIUM CHLORIDE IN NACL 20-0.9 MEQ/L-% IV SOLN
INTRAVENOUS | Status: DC
  Administered 2018-01-01 – 2018-01-02 (×3): via INTRAVENOUS
  Filled 2018-01-01 (×3): qty 1000

## 2018-01-01 MED ORDER — ACETAMINOPHEN 650 MG RE SUPP: 650.0000 mg | Freq: Four times a day (QID) | RECTAL | Status: DC | PRN

## 2018-01-01 MED ORDER — VANCOMYCIN HCL IN DEXTROSE 1-5 GM/200ML-% IV SOLN
1000.0000 mg | Freq: Once | INTRAVENOUS | Status: AC
  Administered 2018-01-01: 1000 mg via INTRAVENOUS
  Filled 2018-01-01: qty 200

## 2018-01-01 MED ORDER — IOHEXOL 300 MG/ML  SOLN
100.0000 mL | Freq: Once | INTRAMUSCULAR | Status: AC | PRN
  Administered 2018-01-01: 75 mL via INTRAVENOUS

## 2018-01-01 MED ORDER — PIPERACILLIN-TAZOBACTAM 3.375 G IVPB 30 MIN
3.3750 g | Freq: Once | INTRAVENOUS | Status: AC
  Administered 2018-01-01: 3.375 g via INTRAVENOUS
  Filled 2018-01-01: qty 50

## 2018-01-01 NOTE — Progress Notes (Signed)
No charge note.  Palliative care:  Chart reviewed. Scheduled meeting with patient and grandson at 4 pm tomorrow, 5/8.  Thank you for this consult.  Gerlean Ren, DNP, AGNP-C Palliative Medicine Team Team Phone # [phone]

## 2018-01-01 NOTE — ED Provider Notes (Signed)
[facility] EMERGENCY DEPARTMENT Provider Note   CSN: [id number] Arrival date & time: 12/31/17  2025     History   Chief Complaint Chief Complaint  Patient presents with  . Emesis    HPI Nicole Key is a 84 y.o. female with a past medical history of fibromyalgia, large hiatal hernia, aspiration pneumonia, HTN who was brought in to the ED last night by her daughter complaining of emesis. Patient is a poor historian and history is primarily provided by her daughter at bedside. Daughter states that 3 days ago the patient began complaining of dysuria. She contacted her PCP who prescribed her Keflex for UTI. She took 1-2 doses of this but yesterday developed nausea and vomiting. Initially the emesis was nonbloody and nonbilious but for the past several hours the emesis has become dark brown in color. Denies any NSAID use. Pt has chronic abdominal pain but states no change. Daughter reports low grade temps at home, 99-100. Denies chest pain. She is feeling [place] of breath, but states this is worsened due to her vomiting.   Of note, pt was last admitted in 08/2017 with sepsis 2/2 aspiration PNA vs colitis.   HPI  Past Medical History:  Diagnosis Date  . Anxiety   . Arthritis   . Aspiration pneumonia (HCC)   . Fibromyalgia   . GERD (gastroesophageal reflux disease)   . Hypertension   . Incarcerated hiatal hernia with obstruction 02/07/2015  . Insomnia   . Meniere's disease 1989  . Syncope 08/15/2013  . Vertigo     Patient Active Problem List   Diagnosis Date Noted  . Pleural effusion   . At risk for aspiration   . Protein-calorie malnutrition (HCC)   . Palliative care by specialist   . Goals of care, counseling/discussion   . GI bleed 09/01/2017  . Hematemesis 09/01/2017  . Melena 09/01/2017  . Hypokalemia 09/01/2017  . Leukocytosis 09/01/2017  . Elevated lactic acid level 09/01/2017  . Intractable nausea and vomiting 09/01/2017  . Tachycardia 09/01/2017   . Dysphagia   . AKI (acute kidney injury) (HCC) 03/09/2017  . Severely underweight adult 03/09/2017  . Abnormal weight loss 03/09/2017  . Cerebrovascular disease 03/09/2017  . Globus sensation 03/09/2017  . Chest pain, rule out acute myocardial infarction 03/08/2017  . Hiatal hernia 02/07/2015  . Sepsis (HCC) 02/04/2015  . Anxiety 02/04/2015  . Essential hypertension 02/04/2015  . GERD (gastroesophageal reflux disease) 02/04/2015  . Protein-calorie malnutrition, severe (HCC) 02/04/2015  . Unstable gait 08/15/2013  . Depression 08/15/2013  . Osteoporosis 07/18/2011  . Meniere's disease 07/18/2011    Past Surgical History:  Procedure Laterality Date  . ABDOMINAL HYSTERECTOMY    . APPENDECTOMY    . BILATERAL TOTAL MASTECTOMY WITH AXILLARY LYMPH NODE DISSECTION  1981  . IR THORACENTESIS ASP PLEURAL SPACE W/IMG GUIDE  09/10/2017     OB History   None      Home Medications    Prior to Admission medications   Medication Sig Start Date End Date Taking? Authorizing Provider  acetaminophen (TYLENOL) 325 MG tablet Take 325-650 mg by mouth every 6 (six) hours as needed for mild pain.    [provider]  ALPRAZolam Prudy Feeler) 0.25 MG tablet Take 1 tablet (0.25 mg total) by mouth at bedtime as needed (anxiety). Patient taking differently: Take 0.25 mg by mouth 2 (two) times daily as needed for anxiety.  10/19/16   Joseph Art, DO  ASPERCREME LIDOCAINE EX Apply 1 application  topically 3 (three) times daily.    [provider]  Cyanocobalamin (VITAMIN B 12 PO) Take 1 tablet by mouth daily.    [provider]  diltiazem (CARDIZEM) 30 MG tablet Take 1 tablet (30 mg total) by mouth 3 (three) times daily. 03/10/17 03/10/18  Tyrone Nine, MD  feeding supplement, ENSURE ENLIVE, (ENSURE ENLIVE) LIQD Take 237 mLs by mouth 2 (two) times daily between meals. 09/14/17   Regalado, Belkys A, MD  HYDROcodone-acetaminophen (NORCO/VICODIN) 5-325 MG tablet Take 1 tablet by mouth  every 8 (eight) hours as needed for moderate pain. 09/14/17   Regalado, Belkys A, MD  hyoscyamine (LEVSIN, ANASPAZ) 0.125 MG tablet Take 0.125 mg by mouth every 4 (four) hours as needed for bladder spasms or cramping.    [provider]  meclizine (ANTIVERT) 25 MG tablet Take 25 mg by mouth 3 (three) times daily as needed for dizziness. 08/25/16   [provider]  metoprolol tartrate (LOPRESSOR) 25 MG tablet Take 1 tablet (25 mg total) by mouth 2 (two) times daily. 10/19/16   Joseph Art, DO  pantoprazole (PROTONIX) 40 MG tablet Take 1 tablet (40 mg total) by mouth 2 (two) times daily before a meal. 09/14/17   Regalado, Belkys A, MD  promethazine (PHENERGAN) 25 MG tablet Take 25 mg by mouth every 8 (eight) hours as needed for nausea/vomiting. 07/03/17   [provider]  sucralfate (CARAFATE) 1 g tablet Take 1 tablet (1 g total) by mouth 4 (four) times daily. 09/14/17 09/14/18  Regalado, Jon Billings A, MD  traZODone (DESYREL) 50 MG tablet Take 1 tablet (50 mg total) by mouth at bedtime as needed for sleep. 02/09/15   Jerald Kief, MD    Family History Family History  Problem Relation Age of Onset  . Aortic aneurysm Mother        Had ruptured aorta  . Lung cancer Father   . Heart disease Father     Social History Social History   Tobacco Use  . Smoking status: Never Smoker  . Smokeless tobacco: Never Used  Substance Use Topics  . Alcohol use: No    Comment: denies alcohol use  . Drug use: No     Allergies   Shellfish allergy   Review of Systems Review of Systems  All other systems reviewed and are negative.    Physical Exam Updated Vital Signs BP (!) 144/90   Pulse (!) 114   Temp 97.6 F (36.4 C) (Oral)   Resp 16   Ht  (1.702 m)   Wt 44.5 kg (98 lb)   SpO2 91%   BMI 15.35 kg/m   Physical Exam  Constitutional: She is oriented to person, place, and time.  Elderly, frail. Actively vomiting  HENT:  Head: Normocephalic and atraumatic.    Mouth/Throat: No oropharyngeal exudate.  Eyes: Pupils are equal, round, and reactive to light. Conjunctivae and EOM are normal. Right eye exhibits no discharge. Left eye exhibits no discharge. No scleral icterus.  Cardiovascular: Regular rhythm, normal heart sounds and intact distal pulses. Exam reveals no gallop and no friction rub.  No murmur heard. Tachycardic  Pulmonary/Chest: No respiratory distress. She has wheezes. She has no rales. She exhibits no tenderness.  Tachypneic.  Bowel sounds heard in lower lobes, hx large hiatal hernia  Abdominal: Soft. She exhibits no distension. There is no tenderness. There is no guarding.  Musculoskeletal: Normal range of motion. She exhibits no edema.  Neurological: She is alert and oriented to  person, place, and time.  Skin: Skin is warm. No rash noted. No erythema. No pallor.  Psychiatric: She has a normal mood and affect. Her behavior is normal.  Nursing note and vitals reviewed.    ED Treatments / Results  Labs (all labs ordered are listed, but only abnormal results are displayed) Labs Reviewed  COMPREHENSIVE METABOLIC PANEL - Abnormal; Notable for the following components:      Result Value   Potassium 3.1 (*)    Chloride 95 (*)    Glucose, Bld 120 (*)    ALT 10 (*)    All other components within normal limits  CBC - Abnormal; Notable for the following components:   WBC 20.2 (*)    RDW 15.8 (*)    All other components within normal limits  CULTURE, BLOOD (ROUTINE X 2)  CULTURE, BLOOD (ROUTINE X 2)  LIPASE, BLOOD  URINALYSIS, ROUTINE W REFLEX MICROSCOPIC  URINALYSIS, ROUTINE W REFLEX MICROSCOPIC  CBC WITH DIFFERENTIAL/PLATELET  I-STAT CG4 LACTIC ACID, ED    EKG None  Radiology No results found.  Procedures Procedures (including critical care time)  Medications Ordered in ED Medications  ondansetron (ZOFRAN) injection 4 mg (has no administration in time range)  0.9 %  sodium chloride infusion (has no administration in  time range)  famotidine (PEPCID) IVPB 20 mg premix (has no administration in time range)    CRITICAL CARE Performed by: Dub Mikes   Total critical care time: 45 minutes  Critical care time was exclusive of separately billable procedures and treating other patients.  Critical care was necessary to treat or prevent imminent or life-threatening deterioration.  Critical care was time spent personally by me on the following activities: development of treatment plan with patient and/or surrogate as well as nursing, discussions with consultants, evaluation of patient's response to treatment, examination of patient, obtaining history from patient or surrogate, ordering and performing treatments and interventions, ordering and review of laboratory studies, ordering and review of radiographic studies, pulse oximetry and re-evaluation of patient's condition.  Initial Impression / Assessment and Plan / ED Course  I have reviewed the triage vital signs and the nursing notes.  Pertinent labs & imaging results that were available during my care of the patient were reviewed by me and considered in my medical decision making (see chart for details).     84 y.o F with pmhx of large hiatal hernia, aspiration PNA, who presented to the ED complaining of N/V onset yesterday. Pt reported dysuria 3 days ago and was empirically started on keflex by PCP but had only taken 2 doses of this before she had sudden onset N/V yesterday. Emesis was initially NBNB but is now coffee ground in appearance.   On arrival to the ED, pt is tachycardic with HR 116. BP stable. Once back in exam room pt began vomiting more CGE and O2 sats dropped to 80%. She was started on 3L O2 and titrated up to 5L because hypoxia persisted. Eventually she required non-rebreather. HR increased to 140. PT was given IVF, zofran and pepcid. Wheezing noted on exam, due to tachycardia xopinex was given. Pt is at high risk aspiration d/t large  hiatal hernia so this was initial concern vs UTI. I had frank discussion with both pt and grandson at bedside who is POA about goals of care. They both agree that they would not like to pursue intubation if this was clinically indicated. Pt does not want to be on life support or mechanical ventilator.  However, grandson is adamant that in the event that pt heart stops they WOULD want [place] to perform CPR.   WBC elevated although it does seem to be chronically elevated per chart review. Lactic acid 3.25. CXR does not show focal infiltrats, lg HH again noted. Pt was started on broad spectrum antibiotics due to unclear source of infection.   Upon re-assessment, pt appears much more comfortable. Tachycardia has improved. O2 sats now 96%, still on non-rebreather. No further vomiting.   Will consult hospitalist for admission.  Spoke with Jill Side with [facility] hospitalists who will admit to their service. Patient was discussed with and seen by Dr. Ranae Palms who agrees with the treatment plan.    Final Clinical Impressions(s) / ED Diagnoses   Final diagnoses:  Aspiration pneumonia [facility])    ED Discharge Orders    None       Dub Mikes, PA-C 01/01/18 0944    Loren Racer, MD 01/03/18 [phone]

## 2018-01-01 NOTE — H&P (Addendum)
History and Physical    Nicole Key ERX:[id number] DOB: 1928-06-11 DOA: 12/31/2017  **Will admit patient based on the expectation that the patient will need hospitalization/ hospital care that crosses at least 2 midnights  PCP: Burnard Bunting, MD   Attending physician: Evangeline Gula  Patient coming from/Resides with: Private residence/lives with grandson  Chief Complaint: Vomiting, lower abdominal pain and dysuria  HPI: Nicole Key is a 84 y.o. female with medical history significant for severe hiatal hernia with GERD and chronic reflux with chronic cough, protein calorie malnutrition, arthritis, fibromyalgia.  Patient reports that about 4 days ago she developed dysuria symptoms and right lower quadrant/suprapubic discomfort so she called her PCP who placed on Keflex.  Patient was nauseated as well.  She took 2 doses of Keflex but developed emesis and has been unable to continue taking the medications.  Urinalysis and culture were not obtained prior to initiation of oral antibiotics in the outpatient setting.  Patient has not had anything to eat or drink in the past 48 hours according to her grandson.  Presentation to the ER patient was afebrile, normotensive, slightly tachycardic in room air saturations were 93%.  After arrival patient began having emesis and suddenly became anemic and it was presumed she had an aspiration event.  Chest x-ray was unremarkable.  White count was 20,200.  Initial lactate was 3.25 which has improved to 9.50 after application of oxygen and administration of IV fluids.  Due to positive sepsis physiology blood cultures were obtained and patient has been started on empiric IV Zosyn and vancomycin presumed UTI as well as aspiration pneumonitis.  Note patient had coffee-ground emesis while in the ER.  In addition to the above symptoms patient's grandson reports that yesterday patient had constipated tarry black stool.  ED Course:  Vital Signs: BP 138/71   Pulse (!)  117   Temp 97.6 F (36.4 C) (Oral)   Resp (!) 27   Ht _0  (1.702 m)   Wt 44.5 kg (98 lb)   SpO2 95%   BMI 15.35 kg/m  CXR: Negative except for large gas-filled hiatal hernia Lab data: Sodium 138, potassium 3.1, chloride 95, CO2 29, glucose 120, BUN 17, creatinine 0.71, LFTs not elevated, initial lactic acid 3.25 with repeat 2.86, initial white count 2200 differential not obtained with hemoglobin 13.3 and platelets 393,000, repeat CBC this morning with white count up to 24,400, neutrophils 86%, absolute neutrophils 0.9%, hemoglobin 13.5, platelets 383,000, cultures have been obtained in the ER, urinalysis and culture pending collection  Medications and treatments: Zofran 4 mg IV x1, Pepcid 20 mg IV x1, Phenergan 6.25 mg IV x1, normal saline bolus x1 L, Xopenex neb 0.63 mg x 1, Zosyn 3.375 g IV x1, vancomycin 1 g IV x1  Review of Systems:  In addition to the HPI above,  No Fever-chills, myalgias or other constitutional symptoms No Headache, changes with Vision or hearing, new weakness, tingling, numbness in any extremity, dizziness, dysarthria or word finding difficulty, gait disturbance or imbalance, tremors or seizure activity No problems swallowing food or Liquids, indigestion/reflux, choking or coughing while eating, abdominal pain with or after eating No Chest pain, palpitations, orthopnea or DOE No melena,hematochezia, dark tarry stools No dysuria, malodorous urine, hematuria or flank pain No new skin rashes, lesions, masses or bruises, No new joint pains, aches, swelling or redness No recent unintentional weight gain or loss No polyuria, polydypsia or polyphagia   Past Medical History:  Diagnosis Date  . Anxiety   .  Arthritis   . Aspiration pneumonia ([place])   . Fibromyalgia   . GERD (gastroesophageal reflux disease)   . Hypertension   . Incarcerated hiatal hernia with obstruction 02/07/2015  . Insomnia   . Meniere's disease 1989  . Syncope 08/15/2013  . Vertigo     Past  Surgical History:  Procedure Laterality Date  . ABDOMINAL HYSTERECTOMY    . APPENDECTOMY    . BILATERAL TOTAL MASTECTOMY WITH AXILLARY LYMPH NODE DISSECTION  1981  . IR THORACENTESIS ASP PLEURAL SPACE W/IMG GUIDE  09/10/2017    Social History   Socioeconomic History  . Marital status: Widowed    Spouse name: Not on file  . Number of children: Not on file  . Years of education: Not on file  . Highest education level: Not on file  Occupational History  . Not on file  Social Needs  . Financial resource strain: Not on file  . Food insecurity:    Worry: Not on file    Inability: Not on file  . Transportation needs:    Medical: Not on file    Non-medical: Not on file  Tobacco Use  . Smoking status: Never Smoker  . Smokeless tobacco: Never Used  Substance and Sexual Activity  . Alcohol use: No    Comment: denies alcohol use  . Drug use: No  . Sexual activity: Never  Lifestyle  . Physical activity:    Days per week: Not on file    Minutes per session: Not on file  . Stress: Not on file  Relationships  . Social connections:    Talks on phone: Not on file    Gets together: Not on file    Attends religious service: Not on file    Active member of club or organization: Not on file    Attends meetings of clubs or organizations: Not on file    Relationship status: Not on file  . Intimate partner violence:    Fear of current or ex partner: Not on file    Emotionally abused: Not on file    Physically abused: Not on file    Forced sexual activity: Not on file  Other Topics Concern  . Not on file  Social History Narrative  . Not on file    Mobility: Rolling walker with 1+ assist Work history: Not obtained   Allergies  Allergen Reactions  . Shellfish Allergy Hives    Family History  Problem Relation Age of Onset  . Aortic aneurysm Mother        Had ruptured aorta  . Lung cancer Father   . Heart disease Father      Prior to Admission medications   Medication  Sig Start Date End Date Taking? Authorizing Provider  acetaminophen (TYLENOL) 325 MG tablet Take 325-650 mg by mouth every 6 (six) hours as needed for mild pain.   Yes [provider]  ALPRAZolam (XANAX) 0.25 MG tablet Take 1 tablet (0.25 mg total) by mouth at bedtime as needed (anxiety). Patient taking differently: Take 0.25 mg by mouth 2 (two) times daily as needed for anxiety.  10/19/16  Yes Vann, Jessica U, DO  ASPERCREME LIDOCAINE EX Apply 1 application topically 3 (three) times daily.   Yes [provider]  Cyanocobalamin (VITAMIN B 12 PO) Take 1 tablet by mouth daily.   Yes [provider]  diltiazem (CARDIZEM) 30 MG tablet Take 1 tablet (30 mg total) by mouth 3 (three) times daily. 03/10/17 03/10/18 Yes Vance Gather  B, MD  feeding supplement, ENSURE ENLIVE, (ENSURE ENLIVE) LIQD Take 237 mLs by mouth 2 (two) times daily between meals. 09/14/17  Yes Regalado, Belkys A, MD  HYDROcodone-acetaminophen (NORCO/VICODIN) 5-325 MG tablet Take 1 tablet by mouth every 8 (eight) hours as needed for moderate pain. 09/14/17  Yes Regalado, Belkys A, MD  hyoscyamine (LEVSIN, ANASPAZ) 0.125 MG tablet Take 0.125 mg by mouth every 4 (four) hours as needed for bladder spasms or cramping.   Yes [provider]  meclizine (ANTIVERT) 25 MG tablet Take 25 mg by mouth 3 (three) times daily as needed for dizziness. 08/25/16  Yes [provider]  metoprolol tartrate (LOPRESSOR) 25 MG tablet Take 1 tablet (25 mg total) by mouth 2 (two) times daily. 10/19/16  Yes Eulogio Bear U, DO  pantoprazole (PROTONIX) 40 MG tablet Take 1 tablet (40 mg total) by mouth 2 (two) times daily before a meal. 09/14/17  Yes Regalado, Belkys A, MD  promethazine (PHENERGAN) 25 MG tablet Take 25 mg by mouth every 8 (eight) hours as needed for nausea/vomiting. 07/03/17  Yes [provider]  sucralfate (CARAFATE) 1 g tablet Take 1 tablet (1 g total) by mouth 4 (four) times daily. 09/14/17 09/14/18 Yes  Regalado, Belkys A, MD  traZODone (DESYREL) 50 MG tablet Take 1 tablet (50 mg total) by mouth at bedtime as needed for sleep. Patient not taking: Reported on 01/01/2018 02/09/15   Donne Hazel, MD    Physical Exam: Vitals:   01/01/18 0930 01/01/18 0935 01/01/18 0950 01/01/18 1000  BP: (!) 145/75   138/71  Pulse: (!) 115 (!) 122 (!) 125 (!) 117  Resp: 19 (!) 25 (!) 26 (!) 27  Temp:      TempSrc:      SpO2: 90% 91% 94% 95%  Weight:      Height:          Constitutional: NAD, calm, comfortable; appears chronically ill and cachectic, kyphotic posture Eyes: PERRL, lids and conjunctivae normal ENMT: Mucous membranes are dry. Posterior pharynx clear of any exudate or lesions. Neck: normal, supple, no masses, no thyromegaly Respiratory: Coarse to auscultation bilaterally, no wheezing, no crackles. Normal respiratory effort with mild tachypnea no accessory muscle use at rest.  100% NRB Cardiovascular: Regular rate and rhythm, no murmurs / rubs / gallops. No extremity edema. 2+ pedal pulses. No carotid bruits.  Hands and feet cool to touch with normal capillary refill Abdomen: Focal RLQ/right suprapubic tenderness without guarding or rebounding, no masses palpated. No hepatosplenomegaly. Bowel sounds positive.  Musculoskeletal: no clubbing / cyanosis. No joint deformity upper and lower extremities. Good ROM, no contractures. Normal muscle tone.  Skin: no rashes, lesions, ulcers. No induration Neurologic: CN 2-12 grossly intact. Sensation intact, DTR normal. Strength 5/5 x all 4 extremities.  Psychiatric: Normal judgment and insight. Alert and oriented x 3. Normal mood.    Labs on Admission: I have personally reviewed following labs and imaging studies  CBC: Recent Labs  Lab 12/31/17 2216 01/01/18 0746  WBC 20.2* 24.4*  NEUTROABS  --  20.9*  HGB 13.3 13.5  HCT 41.2 40.7  MCV 87.5 87.2  PLT 393 341   Basic Metabolic Panel: Recent Labs  Lab 12/31/17 2216  NA 138  K 3.1*  CL 95*   CO2 29  GLUCOSE 120*  BUN 17  CREATININE 0.71  CALCIUM 9.3   GFR: Estimated Creatinine Clearance: 34.8 mL/min (by C-G formula based on SCr of 0.71 mg/dL). Liver Function Tests: Recent Labs  Lab  12/31/17 2216  AST 15  ALT 10*  ALKPHOS 46  BILITOT 0.7  PROT 7.8  ALBUMIN 3.5   Recent Labs  Lab 12/31/17 2216  LIPASE 21   No results for input(s): AMMONIA in the last 168 hours. Coagulation Profile: No results for input(s): INR, PROTIME in the last 168 hours. Cardiac Enzymes: No results for input(s): CKTOTAL, CKMB, CKMBINDEX, TROPONINI in the last 168 hours. BNP (last 3 results) No results for input(s): PROBNP in the last 8760 hours. HbA1C: No results for input(s): HGBA1C in the last 72 hours. CBG: No results for input(s): GLUCAP in the last 168 hours. Lipid Profile: No results for input(s): CHOL, HDL, LDLCALC, TRIG, CHOLHDL, LDLDIRECT in the last 72 hours. Thyroid Function Tests: No results for input(s): TSH, T4TOTAL, FREET4, T3FREE, THYROIDAB in the last 72 hours. Anemia Panel: No results for input(s): VITAMINB12, FOLATE, FERRITIN, TIBC, IRON, RETICCTPCT in the last 72 hours. Urine analysis:    Component Value Date/Time   COLORURINE YELLOW 09/14/2017 0453   APPEARANCEUR CLOUDY (A) 09/14/2017 0453   LABSPEC 1.017 09/14/2017 0453   PHURINE 7.0 09/14/2017 0453   GLUCOSEU NEGATIVE 09/14/2017 0453   HGBUR SMALL (A) 09/14/2017 0453   BILIRUBINUR NEGATIVE 09/14/2017 0453   KETONESUR NEGATIVE 09/14/2017 0453   PROTEINUR NEGATIVE 09/14/2017 0453   UROBILINOGEN 0.2 02/03/2015 1928   NITRITE NEGATIVE 09/14/2017 0453   LEUKOCYTESUR NEGATIVE 09/14/2017 0453   Sepsis Labs: _0 (procalcitonin:4,lacticidven:4) )No results found for this or any previous visit (from the past 240 hour(s)).   Radiological Exams on Admission: Dg Chest Port 1 View  Result Date: 01/01/2018 CLINICAL DATA:  Vomiting since yesterday EXAM: PORTABLE CHEST 1 VIEW COMPARISON:  None. FINDINGS:  Stable enlarged cardiac silhouette stable cardiac silhouette. Large gas-filled hiatal hernia. No effusion, infiltrate pneumothorax. Atherosclerotic calcification of the aorta. IMPRESSION: 1.  No acute cardiopulmonary process. 2. Large gas-filled hiatal hernia Electronically Signed   By: Suzy Bouchard M.D.   On: 01/01/2018 07:49    EKG: (Independently reviewed) sinus epicardial with subtle arrhythmia, nonspecific downsloping ST segments in inferolateral leads with voltage criteria met for LVH and otherwise nonspecific repolarization abnormality  Assessment/Plan Principal Problem:   Sepsis  -Patient presents with 4 days of nausea, vomiting, poor intake preceded by dysuria and lower abdominal pain without definitive source identified.  Attempted outpatient Keflex but unable to tolerate secondary to nausea and vomiting.  Since arrival patient currently experienced an aspiration event and now appears to have acute aspiration pneumonitis -Urinalysis and culture to determine if has underlying UTI -Continue broad-spectrum empiric antibiotics with IV Zosyn/vancomycin -Follow-up on blood cultures -Continue to trend lactic acid (2.5) obtain ESR (23) and pro calcitonin (0.31) -Continue IV fluid hydration -CT abdomen/pelvis to better clarify: No intra-abdominal findings to explain patient's sepsis physiology; there was noted to be gallbladder sludge or stones (which may explain patient's nausea symptoms)  Active Problems:   Acute respiratory failure with hypoxia 2/2 Aspiration pneumonia  -Patient had aspiration event after arrival to ER and became hypoxemic with initial chest x-ray unrevealing -CT chest to better clarify: Bibasilar infiltrates left greater than right related to aspiration due to fluid and debris in the lower lobe bronchi.  There was also mild involvement of the right middle lobe and lingula. -Continue supportive care with oxygen -Lung sounds are coarse, without wheezing and given advanced  age would avoid steroids demise risk for acute delirium -Continue IV Zosyn PSI/PORT Score: Pneumonia Severity Index for Adult CAP Patient age: 84 Years Female: Yes (-10 points) Nursing  home resident: No (0 points) Neoplastic disease history: No (0 points) Liver disease: No (0 points) Congestive heart failure: No (0 points) Cerebrovascular disease: No (0 points) Renal disease: No (0 points) Altered mental status: No (0 points) Respiratory rate over 29: No (0 points) Systolic blood pressure below 90: No (0 points) Temp below 35.0 deg C (95 deg F) or over 39.9 deg C (103.8 deg F): No (0 points) Pulse over 124: No (0 points) pH below 7.35: No (0 points) BUN over 29: No (0 points) Sodium below 130: No (0 points) Glucose over 249 ([place]) or over 13.8 (SI): No (0 points) Hematocrit below 30%: No (0 points) Partial pressure of oxygen below 60: Yes (10 points) Pleural effusion on x-ray: No (0 points)  Score: 107 points. Risk class IV, >2.8% mortality.    Hiatal hernia with GERD -Long-standing problem with associated chronic wet sounding cough -On twice daily PPI and Carafate at home but given recent issues with recurrent nausea and vomiting and suspected aspiration will continue NPO status for now -Unable to utilize IV PPI unless active GI bleed due to nationwide shortage -IV Pepcid every 12 hours -Obtain FOB -Keep HOB elevated > 30 degrees -CT abdomen/pelvis as above    Acute hypokalemia -IV replacement and follow labs    Protein calorie malnutrition/FTT -Patient with ongoing issues of malnutrition in the context of severe reflux and hiatal hernia -Typically grazes all day on small portions and does utilize protein supplementation beverages -Nutrition consultation -Speech therapy consultation -Attending discussed at length with patient's grandson who is also POA.  Amenable to palliative care evaluation for goals of care -Mg 1.5-IV replete and follow labs     Fibromyalgia/Arthritis -Monitor for worsening pain symptoms    **Additional lab, imaging and/or diagnostic evaluation at discretion of supervising physician  DVT prophylaxis: SCDs Code Status: DO NOT INTUBATE Family Communication: Grandson Disposition Plan: TBD-patient desires to remain at home and does not wish to be placed in a facility Consults called: Palliative medicine    Nyana Haren L. ANP-BC [facility] Pager [phone]   If 7PM-7AM, please contact night-coverage [email or web address] Password [facility]  01/01/2018, 10:34 AM

## 2018-01-01 NOTE — Progress Notes (Signed)
Pharmacy Antibiotic Note  Nicole Key is a 84 y.o. female admitted on 12/31/2017 with sepsis.  Pharmacy has been consulted for zosyn and vancomycin dosing. Pt is here complaining of emesis and dysuria and was on Keflex outpatient was for a UTI. LA 3.25. WBC up to 24.4. Afebrile. SCr 0.71. CrCl ~ 35-40 mL/min.   Plan: -Vancomycin 1 gm IV once, then vancomycin 750 mg IV Q 24 hours -Zosyn 3.375 gm IV Q 8 hours (EI infusion) -Monitor CBC, renal fx, cultures and clinical progress -VT at SS  Height:  (170.2 cm) Weight: 98 lb (44.5 kg) IBW/kg (Calculated) : 61.6  Temp (24hrs), Avg:97.8 F (36.6 C), Min:97.6 F (36.4 C), Max:98 F (36.7 C)  Recent Labs  Lab 12/31/17 2216 01/01/18 0746 01/01/18 0800  WBC 20.2* 24.4*  --   CREATININE 0.71  --   --   LATICACIDVEN  --   --  3.25*    Estimated Creatinine Clearance: 34.8 mL/min (by C-G formula based on SCr of 0.71 mg/dL).    Allergies  Allergen Reactions  . Shellfish Allergy Hives    Antimicrobials this admission: Vanc 5/7 >>  Zosyn 5/7 >>   Dose adjustments this admission: None  Microbiology results:   Thank you for allowing pharmacy to be a part of this patient's care.  Vinnie Level, PharmD., BCPS Clinical Pharmacist Clinical phone for 01/01/18 until 3:30pm: [phone] If after 3:30pm, please call main pharmacy at: [phone]

## 2018-01-01 NOTE — ED Notes (Signed)
Hospitalist at bedside 

## 2018-01-01 NOTE — ED Notes (Signed)
Patient transported to CT 

## 2018-01-01 NOTE — ED Notes (Signed)
Patient experiencing many episodes of emesis and placed on 3L [place]. ED provider notified and at bed reassessing patient.

## 2018-01-02 DIAGNOSIS — Z515 Encounter for palliative care: Secondary | ICD-10-CM

## 2018-01-02 DIAGNOSIS — E876 Hypokalemia: Secondary | ICD-10-CM

## 2018-01-02 DIAGNOSIS — J690 Pneumonitis due to inhalation of food and vomit: Secondary | ICD-10-CM

## 2018-01-02 DIAGNOSIS — Z7189 Other specified counseling: Secondary | ICD-10-CM

## 2018-01-02 DIAGNOSIS — J9601 Acute respiratory failure with hypoxia: Secondary | ICD-10-CM

## 2018-01-02 LAB — COMPREHENSIVE METABOLIC PANEL
ALBUMIN: 2.4 g/dL — AB (ref 3.5–5.0)
ALK PHOS: 34 U/L — AB (ref 38–126)
ALT: 8 U/L — AB (ref 14–54)
AST: 10 U/L — AB (ref 15–41)
Anion gap: 6 (ref 5–15)
BILIRUBIN TOTAL: 0.6 mg/dL (ref 0.3–1.2)
BUN: 17 mg/dL (ref 6–20)
CALCIUM: 8 mg/dL — AB (ref 8.9–10.3)
CO2: 28 mmol/L (ref 22–32)
CREATININE: 0.66 mg/dL (ref 0.44–1.00)
Chloride: 108 mmol/L (ref 101–111)
GFR calc Af Amer: >60 mL/min (ref >60)
GFR calc non Af Amer: >60 mL/min (ref >60)
GLUCOSE: 110 mg/dL — AB (ref 65–99)
POTASSIUM: 3.4 mmol/L — AB (ref 3.5–5.1)
Sodium: 142 mmol/L (ref 135–145)
TOTAL PROTEIN: 5.3 g/dL — AB (ref 6.5–8.1)

## 2018-01-02 LAB — CBC
HEMATOCRIT: 31.1 % — AB (ref 36.0–46.0)
Hemoglobin: 9.9 g/dL — ABNORMAL LOW (ref 12.0–15.0)
MCH: 28 pg (ref 26.0–34.0)
MCHC: 31.8 g/dL (ref 30.0–36.0)
MCV: 88.1 fL (ref 78.0–100.0)
PLATELETS: 236 10*3/uL (ref 150–400)
RBC: 3.53 MIL/uL — ABNORMAL LOW (ref 3.87–5.11)
RDW: 16 % — AB (ref 11.5–15.5)
WBC: 20.1 10*3/uL — ABNORMAL HIGH (ref 4.0–10.5)

## 2018-01-02 LAB — MAGNESIUM: Magnesium: 2 mg/dL (ref 1.7–2.4)

## 2018-01-02 LAB — URINE CULTURE: Culture: NO GROWTH

## 2018-01-02 MED ORDER — LEVALBUTEROL HCL 0.63 MG/3ML IN NEBU
0.6300 mg | INHALATION_SOLUTION | Freq: Four times a day (QID) | RESPIRATORY_TRACT | Status: DC | PRN
  Administered 2018-01-02: 0.63 mg via RESPIRATORY_TRACT
  Filled 2018-01-02: qty 3

## 2018-01-02 MED ORDER — POTASSIUM CHLORIDE IN NACL 20-0.9 MEQ/L-% IV SOLN
INTRAVENOUS | Status: AC
  Administered 2018-01-02 (×2): via INTRAVENOUS
  Filled 2018-01-02 (×3): qty 1000

## 2018-01-02 MED ORDER — MORPHINE SULFATE (PF) 2 MG/ML IV SOLN
2.0000 mg | INTRAVENOUS | Status: AC
  Administered 2018-01-02: 2 mg via INTRAVENOUS
  Filled 2018-01-02: qty 1

## 2018-01-02 MED ORDER — BOOST / RESOURCE BREEZE PO LIQD CUSTOM
1.0000 | Freq: Three times a day (TID) | ORAL | Status: DC
  Administered 2018-01-03 – 2018-01-09 (×16): 1 via ORAL
  Filled 2018-01-02 (×2): qty 1

## 2018-01-02 MED ORDER — FENTANYL 12 MCG/HR TD PT72
25.0000 ug | MEDICATED_PATCH | TRANSDERMAL | Status: DC
  Administered 2018-01-02 – 2018-01-08 (×3): 25 ug via TRANSDERMAL
  Filled 2018-01-02 (×4): qty 2

## 2018-01-02 MED ORDER — MORPHINE SULFATE (PF) 2 MG/ML IV SOLN
2.0000 mg | INTRAVENOUS | Status: DC | PRN
  Administered 2018-01-03 – 2018-01-04 (×5): 2 mg via INTRAVENOUS
  Filled 2018-01-02 (×6): qty 1

## 2018-01-02 NOTE — Care Management Note (Addendum)
Case Management Note  Patient Details  Name: Nicole Key MRN: [id number] Date of Birth: [birth year]  Subjective/Objective:  From home, presents with sepsis, asp pna, oniv abx, palliative consulted, she is active with [facility] for [facility].  Palliative consulted.   5/13 1131- Letha Cape RN, BSN - more lethargic today, plan to dc home with [facility] services with [facility] and transition to home with hospice at later date.  Plan for dc tomorrow.  Patient can transport by car.                 Action/Plan: NCM will follow for dc needs.   Expected Discharge Date:                  Expected Discharge Plan:  Home w Home Health Services  In-House Referral:     Discharge planning Services  CM Consult  Post Acute Care Choice:  Resumption of Svcs/PTA Provider Choice offered to:     DME Arranged:    DME Agency:     HH Arranged:  RN, PT HH Agency:  [facility] (formerly [facility]  Status of Service:  In process, will continue to follow  If discussed at Long Length of Stay Meetings, dates discussed:    Additional Comments:  Leone Haven, RN 01/02/2018, 2:28 PM

## 2018-01-02 NOTE — Progress Notes (Signed)
Consultation Note Date: 01/02/2018   Patient Name: Nicole Key  DOB: 1927/11/20  MRN: [id number]  Age / Sex: 84 y.o., female  PCP: Burnard Bunting, MD Referring Physician: Kinnie Feil, MD  Reason for Consultation: Establishing goals of care and Psychosocial/spiritual support  HPI/Patient Profile: 84 y.o. female  with past medical history of fibromyalgia, incarcerated hiatal hernia with obstruction in 2016, Meniere's, HTN, GERD, aspiration pneumonia, arthritis, anxiety, gi bleeding, and malnutrition admitted on 12/31/2017 with aspiration pna and sepsis. She was recently diagnosed with a UTI and prescribed Keflex but was unable to tolerate it. She was having N/V in the ED and appears to have aspirated.  PMT consulted for [place].  Clinical Assessment and Goals of Care: I have reviewed medical records including EPIC notes, labs and imaging, assessed the patient and then met at the bedside along with patient's niece, Juliann Pulse, and grandson, [name],  to discuss diagnosis prognosis, GOC, EOL wishes, disposition and options.  I introduced Palliative Medicine as specialized medical care for people living with serious illness. It focuses on providing relief from the symptoms and stress of a serious illness. The goal is to improve quality of life for both the patient and the family.  As far as functional and nutritional status, they tell me patient has been stable for several years. She does require a walker for ambulation and needs assistance with some ADLs but this appears to be her baseline - they do not feel as though she is declining. They tell me she eats small meals. We discussed her low albumin.   We discussed their current illness and what it means in the larger context of their on-going co-morbidities.  Natural disease trajectory and expectations at EOL were discussed. Specifically, we discussed that patient is at risk for aspiration pneumonia again.  We discussed her frailty and weakness.   I attempted to elicit values and goals of care important to the patient.  [name] and patient both state that the patient being at home is a high priority for them both. She tells me her quality of life is more important to her than quantity.   [name] seems unable to discuss that the patient may be nearing the end of life; continues to tell me that other than her recent 2 hospitalizations, she is doing great.   The difference between aggressive medical intervention and comfort care was considered in light of the patient's goals of care. Patient tells me her goal is comfort and she would like to avoid aggressive medical interventions.   Advanced directives, concepts specific to code status, artifical feeding and hydration, and rehospitalization were considered and discussed. Discussed code status and patient elects DNR - family in support of this decision. Patient and family also agree that the patient should not be rehospitalized.  In light of these goals, (to not be rehospitalized, focus on comfort, be at home) we discussed that hospice care may be the most appropriate support for her moving forward. We discussed the type of care hospice provides with focus on comfort and quality of life. Family agrees they would like hospice support. However, they would like to return home with [facility] and once they complete home health, they will call PCP for hospice consult.   We also discussed symptom management - pt c/o uncontrolled back pain. Fentanyl patch started today by primary. Will give one time dose of morphine until fentanyl patch has time to become effective.   Questions and concerns were addressed. The family was  encouraged to call with questions or concerns.   Primary Decision Maker PATIENT with support of grandson, [name] and niece, Juliann Pulse    SUMMARY OF RECOMMENDATIONS   - Patient's goals are: to be at home and not be rehospitalized - Patient would  like to return home with [facility] home [facility] ("to get as strong as possible") then transition to hospice at home - patient/family advised to call PCP for hospice referral when ready - fentanyl patch for pain per primary  - DNR form completed and placed in chart  Code Status/Advance Care Planning:  DNR  Symptom Management:   Patient c/o anxiety - encouraged her to take prescribed prn xanax, spoke with nursing  Fentanyl patch for pain - given one time dose of morphine until patch becomes effective  N/V has resolved  Palliative Prophylaxis:   Aspiration, Bowel Regimen, Delirium Protocol, Frequent Pain Assessment, Oral Care and Turn Reposition  Additional Recommendations (Limitations, Scope, Preferences):  Avoid Hospitalization  Psycho-social/Spiritual:   Desire for further Chaplaincy support:no  Additional Recommendations: Education on Hospice  Prognosis:   Unable to determine poor prognosis r/t PPS 50%, albumin 2.4, poor baseline functional status, risk for aspiration pna  Discharge Planning: Home with Home Health , then transition to home hospice      Primary Diagnoses: Present on Admission: . Sepsis ([place]) . Acute respiratory failure with hypoxia ([place]) . Aspiration pneumonia ([place]) . Acute hypokalemia . Fibromyalgia . Arthritis . Protein calorie malnutrition ([place])   I have reviewed the medical record, interviewed the patient and family, and examined the patient. The following aspects are pertinent.  Past Medical History:  Diagnosis Date  . Anxiety   . Arthritis   . Aspiration pneumonia ([place])   . Fibromyalgia   . GERD (gastroesophageal reflux disease)   . Hypertension   . Incarcerated hiatal hernia with obstruction 02/07/2015  . Insomnia   . Meniere's disease 1989  . Syncope 08/15/2013  . Vertigo    Social History   Socioeconomic History  . Marital status: Widowed    Spouse name: Not on file  . Number of children: Not on file  . Years of education:  Not on file  . Highest education level: Not on file  Occupational History  . Not on file  Social Needs  . Financial resource strain: Not on file  . Food insecurity:    Worry: Not on file    Inability: Not on file  . Transportation needs:    Medical: Not on file    Non-medical: Not on file  Tobacco Use  . Smoking status: Never Smoker  . Smokeless tobacco: Never Used  Substance and Sexual Activity  . Alcohol use: No    Comment: denies alcohol use  . Drug use: No  . Sexual activity: Never  Lifestyle  . Physical activity:    Days per week: Not on file    Minutes per session: Not on file  . Stress: Not on file  Relationships  . Social connections:    Talks on phone: Not on file    Gets together: Not on file    Attends religious service: Not on file    Active member of club or organization: Not on file    Attends meetings of clubs or organizations: Not on file    Relationship status: Not on file  Other Topics Concern  . Not on file  Social History Narrative  . Not on file   Family History  Problem Relation Age of Onset  .  Aortic aneurysm Mother        Had ruptured aorta  . Lung cancer Father   . Heart disease Father    Scheduled Meds: . pneumococcal 23 valent vaccine  0.5 mL Intramuscular Tomorrow-1000  . sodium chloride flush  3 mL Intravenous Q12H   Continuous Infusions: . 0.9 % NaCl with KCl 20 mEq / L 100 mL/hr at 01/02/18 1041  . famotidine (PEPCID) IV Stopped (01/02/18 1023)  . piperacillin-tazobactam (ZOSYN)  IV 3.375 g (01/02/18 0914)  . vancomycin 750 mg (01/02/18 1040)   PRN Meds:.acetaminophen **OR** acetaminophen, ALPRAZolam, levalbuterol, morphine injection, ondansetron **OR** ondansetron (ZOFRAN) IV Allergies  Allergen Reactions  . Shellfish Allergy Hives   Review of Systems  Constitutional: Positive for activity change, appetite change and fatigue.  Respiratory: Negative for shortness of breath.   Gastrointestinal: Negative for abdominal  distention, abdominal pain, nausea and vomiting.  Neurological: Positive for weakness.  Psychiatric/Behavioral: Positive for sleep disturbance. The patient is nervous/anxious.     Physical Exam  Constitutional: She is oriented to person, place, and time. She appears cachectic. She is cooperative. She has a sickly appearance. No distress.  Cardiovascular: Regular rhythm.  Pulmonary/Chest: No accessory muscle usage. Tachypnea noted. No respiratory distress. She has decreased breath sounds.  Abdominal: Soft. There is no tenderness.  Neurological: She is alert and oriented to person, place, and time.  Skin: Skin is warm and dry.  Psychiatric: Her mood appears anxious.    Vital Signs: BP (!) 113/53 (BP Location: Right Leg)   Pulse 71   Temp 98.1 F (36.7 C) (Oral)   Resp 20   Ht _0  (1.702 m)   Wt 47.5 kg (104 lb 11.5 oz)   SpO2 93%   BMI 16.40 kg/m  Pain Scale: 0-10   Pain Score: 8    SpO2: SpO2: 93 % O2 Device:SpO2: 93 % O2 Flow Rate: .O2 Flow Rate (L/min): 2 L/min  IO: Intake/output summary:   Intake/Output Summary (Last 24 hours) at 01/02/2018 1051 Last data filed at 01/02/2018 0600 Gross per 24 hour  Intake 2273.33 ml  Output 300 ml  Net 1973.33 ml    LBM: Last BM Date: 12/31/17 Baseline Weight: Weight: 44.5 kg (98 lb) Most recent weight: Weight: 47.5 kg (104 lb 11.5 oz)     Palliative Assessment/Data: PPS 50%     Time In: 1600 Time Out: 1700 Time Total: 60 minutes Greater than 50%  of this time was spent counseling and coordinating care related to the above assessment and plan.  Juel Burrow, DNP, AGNP-C Palliative Medicine Team [phone]

## 2018-01-02 NOTE — Progress Notes (Addendum)
[facility] PROGRESS NOTE  Nicole Key ZOX:[id number] DOB: 10-Oct-1927 DOA: 12/31/2017 PCP: Geoffry Paradise, MD  Brief summary   84 y.o.femalewith past medical history significantforlarge hiatal hernia,incarcerated hiatal hernia with obstruction, aspiration pneumonia, hypertension, fibromyalgia, anxiety, GERD, and malnutrition, gi bleeding is admitted with aspiration pneumonia, sepsis. Patient was recently diagnosed with a UTI and put on Keflex but has been unable to tolerate it.  She has continued to have emesis in the emergency department and appears to have had an apneic event associated with an aspiration   Assessment/Plan:  Sepsis, suspected aspiration pneumonia. Reported aspiration event in ED as well. Presents with leukocytosis , lactic acidosis.improved. We will cont iv antibiotic treatment, pend cultures. obtain speech eval.    Acute hypoxic respiratory failure due to aspiration, pneumonia. Improved. Cont as above, stable on low flow oxygen   Anemia. History of similar GI bleed, hematemesis. Hg is 9.9, no s/s of acute bleeding, abd exam is benign. Will cont Protonix. D/w patient, agreed for blood Tf if needed  Hypomagnesemia/Hypokalemia and Hypophosphatemia. Replace. Monitor   HTN; Restart home meds. PRN Hydralazine.   Fibromyalgia. Appears stable at baseline.   Hypoalbuminemia: Possible indicative of guarded prognosis. severe malnutrition.  Prognosis is guarded, patient with severe deconditioning. Palliative care meeting today.    Code Status: partial  Family Communication: d/w patient, RN (indicate person spoken with, relationship, and if by phone, the number) Disposition Plan: pend clinical improvement    Consultants:  Palliative care   Procedures:  none  Antibiotics: Anti-infectives (From admission, onward)   Start     Dose/Rate Route Frequency Ordered Stop   01/02/18 0900  vancomycin (VANCOCIN) IVPB 750 mg/150 ml premix     750 mg 150  mL/hr over 60 Minutes Intravenous Every 24 hours 01/01/18 0825     01/01/18 1600  piperacillin-tazobactam (ZOSYN) IVPB 3.375 g     3.375 g 12.5 mL/hr over 240 Minutes Intravenous Every 8 hours 01/01/18 0825     01/01/18 0830  piperacillin-tazobactam (ZOSYN) IVPB 3.375 g     3.375 g 100 mL/hr over 30 Minutes Intravenous  Once 01/01/18 0815 01/01/18 0905   01/01/18 0830  vancomycin (VANCOCIN) IVPB 1000 mg/200 mL premix     1,000 mg 200 mL/hr over 60 Minutes Intravenous  Once 01/01/18 0815 01/01/18 1006        (indicate start date, and stop date if known)  HPI/Subjective: Alert. Reports feeling better, mild cough.   Objective: Vitals:   01/02/18 0121 01/02/18 0411  BP: 117/75 (!) 113/53  Pulse: 87 71  Resp:    Temp: 98.6 F (37 C) 98.1 F (36.7 C)  SpO2: 90% 93%    Intake/Output Summary (Last 24 hours) at 01/02/2018 0950 Last data filed at 01/02/2018 0600 Gross per 24 hour  Intake 2473.33 ml  Output 300 ml  Net 2173.33 ml   Filed Weights   12/31/17 2144 01/01/18 1525 01/02/18 0500  Weight: 44.5 kg (98 lb) 46.2 kg (101 lb 13.6 oz) 47.5 kg (104 lb 11.5 oz)    Exam:   General:  No acute distress   Cardiovascular: s1,s2 rrr  Respiratory: few rales rll  Abdomen: soft, nt   Musculoskeletal: no leg edema    Data Reviewed: Basic Metabolic Panel: Recent Labs  Lab 12/31/17 2216 01/01/18 1055 01/02/18 0228  NA 138  --  142  K 3.1*  --  3.4*  CL 95*  --  108  CO2 29  --  28  GLUCOSE 120*  --  110*  BUN 17  --  17  CREATININE 0.71  --  0.66  CALCIUM 9.3  --  8.0*  MG  --  1.5* 2.0  PHOS  --  3.0  --    Liver Function Tests: Recent Labs  Lab 12/31/17 2216 01/02/18 0228  AST 15 10*  ALT 10* 8*  ALKPHOS 46 34*  BILITOT 0.7 0.6  PROT 7.8 5.3*  ALBUMIN 3.5 2.4*   Recent Labs  Lab 12/31/17 2216  LIPASE 21   No results for input(s): AMMONIA in the last 168 hours. CBC: Recent Labs  Lab 12/31/17 2216 01/01/18 0746 01/02/18 0228  WBC 20.2* 24.4*  20.1*  NEUTROABS  --  20.9*  --   HGB 13.3 13.5 9.9*  HCT 41.2 40.7 31.1*  MCV 87.5 87.2 88.1  PLT 393 383 236   Cardiac Enzymes: No results for input(s): CKTOTAL, CKMB, CKMBINDEX, TROPONINI in the last 168 hours. BNP (last 3 results) Recent Labs    09/01/17 1807 09/09/17 1119  BNP 275.0* 176.7*    ProBNP (last 3 results) No results for input(s): PROBNP in the last 8760 hours.  CBG: No results for input(s): GLUCAP in the last 168 hours.  Recent Results (from the past 240 hour(s))  Culture, blood (routine x 2)     Status: None (Preliminary result)   Collection Time: 01/01/18  6:58 AM  Result Value Ref Range Status   Specimen Description BLOOD RIGHT ANTECUBITAL  Final   Special Requests   Final    BOTTLES DRAWN AEROBIC AND ANAEROBIC Blood Culture adequate volume   Culture   Final    NO GROWTH 1 DAY Performed at [facility], [address], [place], [place] [address]    Report Status PENDING  Incomplete  Culture, blood (routine x 2)     Status: None (Preliminary result)   Collection Time: 01/01/18  7:03 AM  Result Value Ref Range Status   Specimen Description BLOOD LEFT ANTECUBITAL  Final   Special Requests   Final    BOTTLES DRAWN AEROBIC ONLY Blood Culture results may not be optimal due to an inadequate volume of blood received in culture bottles   Culture   Final    NO GROWTH 1 DAY Performed at [facility], [address], [place], [place] [address]    Report Status PENDING  Incomplete  MRSA PCR Screening     Status: None   Collection Time: 01/01/18  3:31 PM  Result Value Ref Range Status   MRSA by PCR NEGATIVE NEGATIVE Final    Comment:        The GeneXpert MRSA Assay (FDA approved for NASAL specimens only), is one component of a comprehensive MRSA colonization surveillance program. It is not intended to diagnose MRSA infection nor to guide or monitor treatment for MRSA infections. Performed at [facility], [address]  [place], [place] [address]   Culture, Urine     Status: None (Preliminary result)   Collection Time: 01/01/18  5:07 PM  Result Value Ref Range Status   Specimen Description URINE, CATHETERIZED  Final   Special Requests   Final    NONE Performed at [facility], [address], [place], [place] [address]    Culture PENDING  Incomplete   Report Status PENDING  Incomplete     Studies: Ct Chest W Contrast  Result Date: 01/01/2018 CLINICAL DATA:  Nausea, vomiting, and diffuse abdominal pain. Recent unexplained weight loss.  EXAM: CT CHEST, ABDOMEN, AND PELVIS WITH CONTRAST TECHNIQUE: Multidetector CT imaging of the chest, abdomen and pelvis was performed following the standard protocol during bolus administration of intravenous contrast. CONTRAST:  75mL OMNIPAQUE IOHEXOL 300 MG/ML  SOLN COMPARISON:  CT of the chest September 07, 2017 and CT the abdomen and pelvis September 01, 2017 FINDINGS: CT CHEST FINDINGS Cardiovascular: Atherosclerotic changes are seen in the nonaneurysmal aorta. No dissection. Central pulmonary arteries are normal in caliber. No obvious filling defects in the visualized pulmonary arteries. The heart is unchanged. The heart is displaced anteriorly by the hiatal hernia. Mediastinum/Nodes: The thyroid is unremarkable. There is a large hiatal hernia containing at least half of the stomach. The hiatal hernia contains fluid and is unchanged in size in the interval. No wall thickening. The remainder of the esophagus is unremarkable. No pleural effusion. There is a small pericardial effusion. A 10 mm precarinal node is stable. A mildly prominent subcarinal node appears stable as well. No new adenopathy identified in the chest. Lungs/Pleura: The trachea and mainstem bronchi are normal. Upper lobe airways are normal. There is opacification of lower lobe bronchi filled with fluid and debris. Bibasilar pulmonary opacities are identified, greater on the left than the right. The lingula and right  middle lobe are also involved. No pneumothorax. No pulmonary nodules or masses. Musculoskeletal: See below. CT ABDOMEN PELVIS FINDINGS Hepatobiliary: Probable tiny cysts scattered throughout the liver, too small to characterize. Hepatic steatosis. The portal vein is patent. The sludge seen in the gallbladder was better appreciated previously but likely remains. No wall thickening or pericholecystic fluid. Pancreas: Unremarkable. No pancreatic ductal dilatation or surrounding inflammatory changes. Spleen: Normal in size without focal abnormality. Adrenals/Urinary Tract: Adrenal glands are normal. Probable small renal cysts, too small to characterize. No suspicious masses. No hydronephrosis or perinephric stranding. No ureterectasis or ureteral stones. The bladder is normal. The air in the wall of bladder previously has resolved. Stomach/Bowel: There is a large hiatal hernia with over half of the stomach above the diaphragm. No gastric wall thickening. There is a large complicated duodenal diverticulum which is stable. The remainder of the small bowel is normal. Colonic diverticulosis is seen without diverticulitis. The previously identified colitis has resolved. There is mild fecal loading in the cecum. The appendix is not visualized consistent with history of previous appendectomy. Vascular/Lymphatic: Atherosclerotic changes are identified in the abdominal aorta. 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 This appears to be stable since in June of 2016. The aorta is tortuous but nonaneurysmal. Atherosclerotic changes extend into the iliac and femoral vessels. The venous structures are not well assessed due to timing of contrast. No adenopathy. Reproductive: Status post hysterectomy. No adnexal masses. Other: No free air. Haziness in the left abdomen as seen on axial images 69 through 72 is volume averaging off of a slip of peritoneum, which is unchanged based on  coronal imaging. No free fluid. Musculoskeletal: There is a compression fracture of L3 which is unchanged since September 01, 2017. There is a mild compression fracture of T10 which is stable since September 07, 2017. No acute bony abnormalities are identified. A healed sternal fracture is noted. IMPRESSION: 1. Bibasilar infiltrates, left greater than right could represent pneumonia or aspiration. Aspiration is favored due to fluid and debris in the lower lobe bronchi. Recommend clinical correlation and follow-up to resolution. There is also mild involvement of the right middle lobe and lingula. 2. Large hiatal hernia, unchanged. 3. Atherosclerotic changes  throughout the aorta. 4. 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 5. Mildly prominent nodes in the mediastinum are likely reactive. They are stable. 6. Gallbladder sludge or stones, better seen on previous imaging. 7. Diverticulosis without diverticulitis. 8. Chronic compression fractures of T10 and L3. Aortic Atherosclerosis (ICD10-I70.0). Electronically Signed   By: Gerome Sam III M.D   On: 01/01/2018 12:16   Ct Abdomen Pelvis W Contrast  Result Date: 01/01/2018 CLINICAL DATA:  Nausea, vomiting, and diffuse abdominal pain. Recent unexplained weight loss. EXAM: CT CHEST, ABDOMEN, AND PELVIS WITH CONTRAST TECHNIQUE: Multidetector CT imaging of the chest, abdomen and pelvis was performed following the standard protocol during bolus administration of intravenous contrast. CONTRAST:  75mL OMNIPAQUE IOHEXOL 300 MG/ML  SOLN COMPARISON:  CT of the chest September 07, 2017 and CT the abdomen and pelvis September 01, 2017 FINDINGS: CT CHEST FINDINGS Cardiovascular: Atherosclerotic changes are seen in the nonaneurysmal aorta. No dissection. Central pulmonary arteries are normal in caliber. No obvious filling defects in the visualized pulmonary arteries. The heart is unchanged. The heart is displaced anteriorly by the hiatal hernia.  Mediastinum/Nodes: The thyroid is unremarkable. There is a large hiatal hernia containing at least half of the stomach. The hiatal hernia contains fluid and is unchanged in size in the interval. No wall thickening. The remainder of the esophagus is unremarkable. No pleural effusion. There is a small pericardial effusion. A 10 mm precarinal node is stable. A mildly prominent subcarinal node appears stable as well. No new adenopathy identified in the chest. Lungs/Pleura: The trachea and mainstem bronchi are normal. Upper lobe airways are normal. There is opacification of lower lobe bronchi filled with fluid and debris. Bibasilar pulmonary opacities are identified, greater on the left than the right. The lingula and right middle lobe are also involved. No pneumothorax. No pulmonary nodules or masses. Musculoskeletal: See below. CT ABDOMEN PELVIS FINDINGS Hepatobiliary: Probable tiny cysts scattered throughout the liver, too small to characterize. Hepatic steatosis. The portal vein is patent. The sludge seen in the gallbladder was better appreciated previously but likely remains. No wall thickening or pericholecystic fluid. Pancreas: Unremarkable. No pancreatic ductal dilatation or surrounding inflammatory changes. Spleen: Normal in size without focal abnormality. Adrenals/Urinary Tract: Adrenal glands are normal. Probable small renal cysts, too small to characterize. No suspicious masses. No hydronephrosis or perinephric stranding. No ureterectasis or ureteral stones. The bladder is normal. The air in the wall of bladder previously has resolved. Stomach/Bowel: There is a large hiatal hernia with over half of the stomach above the diaphragm. No gastric wall thickening. There is a large complicated duodenal diverticulum which is stable. The remainder of the small bowel is normal. Colonic diverticulosis is seen without diverticulitis. The previously identified colitis has resolved. There is mild fecal loading in the  cecum. The appendix is not visualized consistent with history of previous appendectomy. Vascular/Lymphatic: Atherosclerotic changes are identified in the abdominal aorta. 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 This appears to be stable since in June of 2016. The aorta is tortuous but nonaneurysmal. Atherosclerotic changes extend into the iliac and femoral vessels. The venous structures are not well assessed due to timing of contrast. No adenopathy. Reproductive: Status post hysterectomy. No adnexal masses. Other: No free air. Haziness in the left abdomen as seen on axial images 69 through 72 is volume averaging off of a slip of peritoneum, which is unchanged based on coronal imaging. No free fluid. Musculoskeletal: There  is a compression fracture of L3 which is unchanged since September 01, 2017. There is a mild compression fracture of T10 which is stable since September 07, 2017. No acute bony abnormalities are identified. A healed sternal fracture is noted. IMPRESSION: 1. Bibasilar infiltrates, left greater than right could represent pneumonia or aspiration. Aspiration is favored due to fluid and debris in the lower lobe bronchi. Recommend clinical correlation and follow-up to resolution. There is also mild involvement of the right middle lobe and lingula. 2. Large hiatal hernia, unchanged. 3. Atherosclerotic changes throughout the aorta. 4. 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 5. Mildly prominent nodes in the mediastinum are likely reactive. They are stable. 6. Gallbladder sludge or stones, better seen on previous imaging. 7. Diverticulosis without diverticulitis. 8. Chronic compression fractures of T10 and L3. Aortic Atherosclerosis (ICD10-I70.0). Electronically Signed   By: Gerome Sam III M.D   On: 01/01/2018 12:16   Dg Chest Port 1 View  Result Date: 01/01/2018 CLINICAL DATA:  Vomiting since yesterday EXAM: PORTABLE  CHEST 1 VIEW COMPARISON:  None. FINDINGS: Stable enlarged cardiac silhouette stable cardiac silhouette. Large gas-filled hiatal hernia. No effusion, infiltrate pneumothorax. Atherosclerotic calcification of the aorta. IMPRESSION: 1.  No acute cardiopulmonary process. 2. Large gas-filled hiatal hernia Electronically Signed   By: Genevive Bi M.D.   On: 01/01/2018 07:49    Scheduled Meds: . pneumococcal 23 valent vaccine  0.5 mL Intramuscular Tomorrow-1000  . sodium chloride flush  3 mL Intravenous Q12H   Continuous Infusions: . 0.9 % NaCl with KCl 20 mEq / L 100 mL/hr at 01/02/18 0714  . famotidine (PEPCID) IV 20 mg (01/02/18 0905)  . piperacillin-tazobactam (ZOSYN)  IV 3.375 g (01/02/18 0914)  . vancomycin      Principal Problem:   Sepsis (HCC) Active Problems:   Acute respiratory failure with hypoxia (HCC)   Aspiration pneumonia (HCC)   Hiatal hernia with GERD   Acute hypokalemia   Fibromyalgia   Arthritis   Protein calorie malnutrition (HCC)    Time spent: >35 minutes     Esperanza Sheets  [facility] Pager [phone]. If 7PM-7AM, please contact night-coverage at [email or web address], password [facility] 01/02/2018, 9:50 AM  LOS: 1 day

## 2018-01-02 NOTE — Evaluation (Signed)
Clinical/Bedside Swallow Evaluation Patient Details  Name: Nicole Key MRN: [id number] Date of Birth: 1928/05/09  Today's Date: 01/02/2018 Time: SLP Start Time (ACUTE ONLY): 0920 SLP Stop Time (ACUTE ONLY): 0937 SLP Time Calculation (min) (ACUTE ONLY): 17 min  Past Medical History:  Past Medical History:  Diagnosis Date  . Anxiety   . Arthritis   . Aspiration pneumonia (HCC)   . Fibromyalgia   . GERD (gastroesophageal reflux disease)   . Hypertension   . Incarcerated hiatal hernia with obstruction 02/07/2015  . Insomnia   . Meniere's disease 1989  . Syncope 08/15/2013  . Vertigo    Past Surgical History:  Past Surgical History:  Procedure Laterality Date  . ABDOMINAL HYSTERECTOMY    . APPENDECTOMY    . BILATERAL TOTAL MASTECTOMY WITH AXILLARY LYMPH NODE DISSECTION  1981  . IR THORACENTESIS ASP PLEURAL SPACE W/IMG GUIDE  09/10/2017   HPI:  Pt is a 84 y.o. female with medical history significant for severe hiatal hernia with GERD and chronic reflux with chronic cough, protein calorie malnutrition, arthritis, fibromyalgia. A few days PTA she was started on tx for UTI symptoms  but developed emesis. She is admitted with sepsis. There was concern for aspiration during an episode of emesis in the ED with CT Chest showing bibasilar infiltrates L > R concerning for aspiration due to fluid and debris in the lower lobes. Pt has had several clinical swallowing evaluations in the past, all suggestive of adequate oropharyngeal function but with concern for a primary esophageal dysphagia.   Assessment / Plan / Recommendation Clinical Impression  Pt has consistent second swallows but no overt signs of aspiration with consumption of thin liquids. She politely declined attempts at more solid POs out of concern for vomitting. Would recommend starting with clear liquid diet with SLP f/u to facilitate advancement; however, suspect that pt's symptoms are primarily esophageal in nature given her  history and reports of frequent regurgitation at home. Her risk for aspiration is likely greatest post-prandially and is also likely more chronic.  SLP Visit Diagnosis: Dysphagia, unspecified (R13.10)    Aspiration Risk  Mild aspiration risk    Diet Recommendation Thin liquid   Medication Administration: Whole meds with puree Supervision: Patient able to self feed;Intermittent supervision to cue for compensatory strategies Compensations: Slow rate;Small sips/bites Postural Changes: Seated upright at 90 degrees;Remain upright for at least 30 minutes after po intake    Other  Recommendations Oral Care Recommendations: Oral care BID   Follow up Recommendations None      Frequency and Duration min 2x/week  2 weeks       Prognosis Prognosis for Safe Diet Advancement: Good      Swallow Study   General HPI: Pt is a 84 y.o. female with medical history significant for severe hiatal hernia with GERD and chronic reflux with chronic cough, protein calorie malnutrition, arthritis, fibromyalgia. A few days PTA she was started on tx for UTI symptoms  but developed emesis. She is admitted with sepsis. There was concern for aspiration during an episode of emesis in the ED with CT Chest showing bibasilar infiltrates L > R concerning for aspiration due to fluid and debris in the lower lobes. Pt has had several clinical swallowing evaluations in the past, all suggestive of adequate oropharyngeal function but with concern for a primary esophageal dysphagia. Type of Study: Bedside Swallow Evaluation Previous Swallow Assessment: see HPI Diet Prior to this Study: NPO Temperature Spikes Noted: No Respiratory Status: Nasal cannula History  of Recent Intubation: No Behavior/Cognition: Alert;Cooperative;Pleasant mood Oral Care Completed by SLP: No Oral Cavity - Dentition: Adequate natural dentition Vision: Functional for self-feeding Self-Feeding Abilities: Able to feed self Patient Positioning: Upright  in bed Baseline Vocal Quality: Hoarse;Low vocal intensity    Oral/Motor/Sensory Function Overall Oral Motor/Sensory Function: Generalized oral weakness   Ice Chips Ice chips: Not tested   Thin Liquid Thin Liquid: Impaired Presentation: Self Fed;Straw Pharyngeal  Phase Impairments: Multiple swallows    Nectar Thick Nectar Thick Liquid: Not tested   Honey Thick Honey Thick Liquid: Not tested   Puree Puree: Not tested   Solid   GO   Solid: Not tested        Maxcine Ham 01/02/2018,9:49 AM   Maxcine Ham, M.A. CCC-SLP [phone]

## 2018-01-02 NOTE — Progress Notes (Signed)
PT Cancellation Note  Patient Details Name: Nicole Key MRN: [id number] DOB: [birth year]   Cancelled Treatment:    Reason Eval/Treat Not Completed: Pain limiting ability to participate. Pt politely declined x 2 due to 10/10 back pain between shoulder blades. Pt just received morphine. Pt asked for PT to come back tomorrow. Acute PT to return as able.  Lewis Shock, PT, DPT Pager #: [phone] Office #: [phone]    Nicole Key 01/02/2018, 12:41 PM

## 2018-01-03 DIAGNOSIS — M25561 Pain in right knee: Secondary | ICD-10-CM

## 2018-01-03 DIAGNOSIS — M25562 Pain in left knee: Secondary | ICD-10-CM

## 2018-01-03 LAB — CBC
HCT: 35.3 % — ABNORMAL LOW (ref 36.0–46.0)
Hemoglobin: 11.5 g/dL — ABNORMAL LOW (ref 12.0–15.0)
MCH: 29.1 pg (ref 26.0–34.0)
MCHC: 32.6 g/dL (ref 30.0–36.0)
MCV: 89.4 fL (ref 78.0–100.0)
PLATELETS: 240 10*3/uL (ref 150–400)
RBC: 3.95 MIL/uL (ref 3.87–5.11)
RDW: 16.3 % — AB (ref 11.5–15.5)
WBC: 21.8 10*3/uL — AB (ref 4.0–10.5)

## 2018-01-03 NOTE — Progress Notes (Signed)
  Speech Language Pathology Treatment: Dysphagia  Patient Details Name: Nicole Key MRN: [id number] DOB: Nov 30, 1927 Today's Date: 01/03/2018 Time: 1324-4010 SLP Time Calculation (min) (ACUTE ONLY): 12 min  Assessment / Plan / Recommendation Clinical Impression  Pt was seen upright in her chair, reporting good tolerance of liquids so far. She denies any further regurgitation, although she does say that she has been coughing up some secretions intermittently throughout the day. One delayed cough was noted across advanced trials, but otherwise with seemingly good tolerance of POs consumed. SLP provided Min cues for alternating bites/sips. Discussed different diet textures with pt who prefers to start with Dys 2 textures, as this seems most consistent with textures she consumes at home. Will advance diet and monitor briefly for tolerance.    HPI HPI: Pt is a 84 y.o. female with medical history significant for severe hiatal hernia with GERD and chronic reflux with chronic cough, protein calorie malnutrition, arthritis, fibromyalgia. A few days PTA she was started on tx for UTI symptoms  but developed emesis. She is admitted with sepsis. There was concern for aspiration during an episode of emesis in the ED with CT Chest showing bibasilar infiltrates L > R concerning for aspiration due to fluid and debris in the lower lobes. Pt has had several clinical swallowing evaluations in the past, all suggestive of adequate oropharyngeal function but with concern for a primary esophageal dysphagia.      SLP Plan  Continue with current plan of care       Recommendations  Diet recommendations: Dysphagia 2 (fine chop);Thin liquid Liquids provided via: Cup;Straw Medication Administration: Whole meds with puree Supervision: Patient able to self feed;Intermittent supervision to cue for compensatory strategies Compensations: Slow rate;Small sips/bites;Follow solids with liquid Postural Changes and/or Swallow  Maneuvers: Seated upright 90 degrees;Upright 30-60 min after meal                Oral Care Recommendations: Oral care BID Follow up Recommendations: None SLP Visit Diagnosis: Dysphagia, unspecified (R13.10) Plan: Continue with current plan of care       GO                Maxcine Ham 01/03/2018, 11:15 AM  Maxcine Ham, M.A. CCC-SLP [phone]

## 2018-01-03 NOTE — Progress Notes (Signed)
Initial Nutrition Assessment  DOCUMENTATION CODES:   Severe malnutrition in context of chronic illness, Underweight  INTERVENTION:    Boost Breeze po TID, each supplement provides 250 kcal and 9 grams of protein  NUTRITION DIAGNOSIS:   Severe Malnutrition related to chronic illness(fibromyalgia, anxiety, recurrent hiatal hernia) as evidenced by severe fat depletion, severe muscle depletion  GOAL:   Patient will meet greater than or equal to 90% of their needs  MONITOR:   PO intake, Supplement acceptance, Labs, Skin, Weight trends, I & O's  REASON FOR ASSESSMENT:   Consult Assessment of nutrition requirement/status  ASSESSMENT:   84 y.o. Female with PMH significant for large hiatal hernia, incarcerated hiatal hernia with obstruction, aspiration pneumonia, hypertension, fibromyalgia, anxiety, GERD, and malnutrition, gi bleeding is admitted with aspiration pneumonia, sepsis.  Pt known to this RD during previous hospitalization in 08/2017. She has a hx of dysphagia & malnutrition which is ongoing.  RD met with pt and niece at bedside. Pt states her appetite is "okay". Typically eats 3-4 times per day.  Breakfast: [employer] Instant Breakfast (240 kcals, 10 gm protein) Brunch: eggs & sausage  Supper: [employer] frozen meal  Snack: banana or applesauce  SLP following. Pt advanced to Dys 2- thin liquids today. Labs and medications reviewed. K 3.4 (L) 5/8. Palliative Medicine Team note reviewed.  NUTRITION - FOCUSED PHYSICAL EXAM:    Most Recent Value  Orbital Region  Moderate depletion  Upper Arm Region  Severe depletion  Thoracic and Lumbar Region  Unable to assess  Buccal Region  Moderate depletion  Temple Region  Moderate depletion  Clavicle Bone Region  Severe depletion  Clavicle and Acromion Bone Region  Severe depletion  Scapular Bone Region  Severe depletion  Dorsal Hand  Moderate depletion  Patellar Region  Severe depletion  Anterior Thigh Region  Severe  depletion  Posterior Calf Region  Severe depletion  Edema (RD Assessment)  None     Diet Order:   Diet Order           DIET DYS 2 Room service appropriate? Yes; Fluid consistency: Thin  Diet effective now         EDUCATION NEEDS:   No education needs have been identified at this time  Skin:  Skin Assessment: Reviewed RN Assessment  Last BM:  5/6   Intake/Output Summary (Last 24 hours) at 01/03/2018 1619 Last data filed at 01/03/2018 1100 Gross per 24 hour  Intake 1906.33 ml  Output 50 ml  Net 1856.33 ml   Height:   Ht Readings from Last 1 Encounters:  01/01/18 '5\' 7"'  (1.702 m)   Weight:   Wt Readings from Last 1 Encounters:  01/03/18 107 lb 12.9 oz (48.9 kg)   Ideal Body Weight:  61.3 kg  BMI:  Body mass index is 16.88 kg/m.  Estimated Nutritional Needs:   Kcal:  1300-1500  Protein:  60-75 gm  Fluid:  >/= 1.5 L  Arthur Holms, RD, LDN Pager #: [phone] After-Hours Pager #: [phone]

## 2018-01-03 NOTE — Progress Notes (Signed)
[facility] PROGRESS NOTE  Nicole Key:[id number] DOB: [birth year] DOA: 12/31/2017 PCP: Geoffry Paradise, MD  Brief summary   84 y.o.femalewith past medical history significantforlarge hiatal hernia,incarcerated hiatal hernia with obstruction, aspiration pneumonia, hypertension, fibromyalgia, anxiety, GERD, and malnutrition, gi bleeding is admitted with aspiration pneumonia, sepsis. Patient was recently diagnosed with a UTI and put on Keflex but has been unable to tolerate it.  She has continued to have emesis in the emergency department and appears to have had an apneic event associated with an aspiration   Assessment/Plan:  Sepsis, due to aspiration pneumonia. Reported aspiration event in ED as well. Presents with leukocytosis , lactic acidosis Still with producitve cough, leukocytosis. cont iv antibiotic treatment, discontinue vanc in 24 hrs. prelim blood cultures: NGTD. speech eval-diet thin liquid   Acute hypoxic respiratory failure due to aspiration, pneumonia. Improved. Cont as above, stable on low flow oxygen   Anemia. History of similar GI bleed, hematemesis in the past. Hg is stable, no s/s of acute bleeding, abd exam is benign. Will cont Protonix. D/w patient, agreed for blood Tf if needed. Monitor Hg   Hypomagnesemia/Hypokalemia and Hypophosphatemia. Replace. Monitor   HTN; Restarted home meds. PRN Hydralazine.   Fibromyalgia. Appears stable at baseline.   Hypoalbuminemia: Possible indicative of guarded prognosis. severe malnutrition.  Prognosis is guarded, patient with severe deconditioning. Palliative care meeting: planned for HHC at discharge with outpatient hospice referral.    Code Status: partial  Family Communication: d/w patient, RN (indicate person spoken with, relationship, and if by phone, the number) Disposition Plan: pend clinical improvement. Remains inpatient    Consultants:  Palliative care    Procedures:  none  Antibiotics: Anti-infectives (From admission, onward)   Start     Dose/Rate Route Frequency Ordered Stop   01/02/18 0900  vancomycin (VANCOCIN) IVPB 750 mg/150 ml premix     750 mg 150 mL/hr over 60 Minutes Intravenous Every 24 hours 01/01/18 0825     01/01/18 1600  piperacillin-tazobactam (ZOSYN) IVPB 3.375 g     3.375 g 12.5 mL/hr over 240 Minutes Intravenous Every 8 hours 01/01/18 0825     01/01/18 0830  piperacillin-tazobactam (ZOSYN) IVPB 3.375 g     3.375 g 100 mL/hr over 30 Minutes Intravenous  Once 01/01/18 0815 01/01/18 0905   01/01/18 0830  vancomycin (VANCOCIN) IVPB 1000 mg/200 mL premix     1,000 mg 200 mL/hr over 60 Minutes Intravenous  Once 01/01/18 0815 01/01/18 1006       (indicate start date, and stop date if known)  HPI/Subjective: productive cough, afebrile. Reports feeling better  Objective: Vitals:   01/03/18 0838 01/03/18 0844  BP: (!) 150/59 (!) 142/73  Pulse:    Resp: 20   Temp: 98 F (36.7 C)   SpO2: 92%     Intake/Output Summary (Last 24 hours) at 01/03/2018 0920 Last data filed at 01/03/2018 0520 Gross per 24 hour  Intake 1506.33 ml  Output 50 ml  Net 1456.33 ml   Filed Weights   01/01/18 1525 01/02/18 0500 01/03/18 0423  Weight: 46.2 kg (101 lb 13.6 oz) 47.5 kg (104 lb 11.5 oz) 48.9 kg (107 lb 12.9 oz)    Exam:   General:  No acute distress   Cardiovascular: s1,s2 rrr  Respiratory: few rales rll  Abdomen: soft, nt   Musculoskeletal: no leg edema    Data Reviewed: Basic Metabolic Panel: Recent Labs  Lab 12/31/17 2216 01/01/18 1055 01/02/18 0228  NA 138  --  142  K 3.1*  --  3.4*  CL 95*  --  108  CO2 29  --  28  GLUCOSE 120*  --  110*  BUN 17  --  17  CREATININE 0.71  --  0.66  CALCIUM 9.3  --  8.0*  MG  --  1.5* 2.0  PHOS  --  3.0  --    Liver Function Tests: Recent Labs  Lab 12/31/17 2216 01/02/18 0228  AST 15 10*  ALT 10* 8*  ALKPHOS 46 34*  BILITOT 0.7 0.6  PROT 7.8 5.3*   ALBUMIN 3.5 2.4*   Recent Labs  Lab 12/31/17 2216  LIPASE 21   No results for input(s): AMMONIA in the last 168 hours. CBC: Recent Labs  Lab 12/31/17 2216 01/01/18 0746 01/02/18 0228 01/03/18 0204  WBC 20.2* 24.4* 20.1* 21.8*  NEUTROABS  --  20.9*  --   --   HGB 13.3 13.5 9.9* 11.5*  HCT 41.2 40.7 31.1* 35.3*  MCV 87.5 87.2 88.1 89.4  PLT 393 383 236 240   Cardiac Enzymes: No results for input(s): CKTOTAL, CKMB, CKMBINDEX, TROPONINI in the last 168 hours. BNP (last 3 results) Recent Labs    09/01/17 1807 09/09/17 1119  BNP 275.0* 176.7*    ProBNP (last 3 results) No results for input(s): PROBNP in the last 8760 hours.  CBG: No results for input(s): GLUCAP in the last 168 hours.  Recent Results (from the past 240 hour(s))  Culture, blood (routine x 2)     Status: None (Preliminary result)   Collection Time: 01/01/18  6:58 AM  Result Value Ref Range Status   Specimen Description BLOOD RIGHT ANTECUBITAL  Final   Special Requests   Final    BOTTLES DRAWN AEROBIC AND ANAEROBIC Blood Culture adequate volume   Culture   Final    NO GROWTH 1 DAY Performed at [facility], [address] [place], [place] [address]    Report Status PENDING  Incomplete  Culture, blood (routine x 2)     Status: None (Preliminary result)   Collection Time: 01/01/18  7:03 AM  Result Value Ref Range Status   Specimen Description BLOOD LEFT ANTECUBITAL  Final   Special Requests   Final    BOTTLES DRAWN AEROBIC ONLY Blood Culture results may not be optimal due to an inadequate volume of blood received in culture bottles   Culture   Final    NO GROWTH 1 DAY Performed at [facility], [address] [place], [place] [address]    Report Status PENDING  Incomplete  MRSA PCR Screening     Status: None   Collection Time: 01/01/18  3:31 PM  Result Value Ref Range Status   MRSA by PCR NEGATIVE NEGATIVE Final    Comment:        The GeneXpert MRSA Assay (FDA approved for NASAL  specimens only), is one component of a comprehensive MRSA colonization surveillance program. It is not intended to diagnose MRSA infection nor to guide or monitor treatment for MRSA infections. Performed at [facility], [address] [place], [place] [address]   Culture, Urine     Status: None   Collection Time: 01/01/18  5:07 PM  Result Value Ref Range Status   Specimen Description URINE, CATHETERIZED  Final   Special Requests NONE  Final   Culture   Final    NO GROWTH Performed at [facility], [address] [place], [place]  [address]    Report Status 01/02/2018 FINAL  Final     Studies: Ct Chest W Contrast  Result Date: 01/01/2018 CLINICAL DATA:  Nausea, vomiting, and diffuse abdominal pain. Recent unexplained weight loss. EXAM: CT CHEST, ABDOMEN, AND PELVIS WITH CONTRAST TECHNIQUE: Multidetector CT imaging of the chest, abdomen and pelvis was performed following the standard protocol during bolus administration of intravenous contrast. CONTRAST:  75mL OMNIPAQUE IOHEXOL 300 MG/ML  SOLN COMPARISON:  CT of the chest September 07, 2017 and CT the abdomen and pelvis September 01, 2017 FINDINGS: CT CHEST FINDINGS Cardiovascular: Atherosclerotic changes are seen in the nonaneurysmal aorta. No dissection. Central pulmonary arteries are normal in caliber. No obvious filling defects in the visualized pulmonary arteries. The heart is unchanged. The heart is displaced anteriorly by the hiatal hernia. Mediastinum/Nodes: The thyroid is unremarkable. There is a large hiatal hernia containing at least half of the stomach. The hiatal hernia contains fluid and is unchanged in size in the interval. No wall thickening. The remainder of the esophagus is unremarkable. No pleural effusion. There is a small pericardial effusion. A 10 mm precarinal node is stable. A mildly prominent subcarinal node appears stable as well. No new adenopathy identified in the chest. Lungs/Pleura: The trachea and  mainstem bronchi are normal. Upper lobe airways are normal. There is opacification of lower lobe bronchi filled with fluid and debris. Bibasilar pulmonary opacities are identified, greater on the left than the right. The lingula and right middle lobe are also involved. No pneumothorax. No pulmonary nodules or masses. Musculoskeletal: See below. CT ABDOMEN PELVIS FINDINGS Hepatobiliary: Probable tiny cysts scattered throughout the liver, too small to characterize. Hepatic steatosis. The portal vein is patent. The sludge seen in the gallbladder was better appreciated previously but likely remains. No wall thickening or pericholecystic fluid. Pancreas: Unremarkable. No pancreatic ductal dilatation or surrounding inflammatory changes. Spleen: Normal in size without focal abnormality. Adrenals/Urinary Tract: Adrenal glands are normal. Probable small renal cysts, too small to characterize. No suspicious masses. No hydronephrosis or perinephric stranding. No ureterectasis or ureteral stones. The bladder is normal. The air in the wall of bladder previously has resolved. Stomach/Bowel: There is a large hiatal hernia with over half of the stomach above the diaphragm. No gastric wall thickening. There is a large complicated duodenal diverticulum which is stable. The remainder of the small bowel is normal. Colonic diverticulosis is seen without diverticulitis. The previously identified colitis has resolved. There is mild fecal loading in the cecum. The appendix is not visualized consistent with history of previous appendectomy. Vascular/Lymphatic: Atherosclerotic changes are identified in the abdominal aorta. 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 This appears to be stable since in June of 2016. The aorta is tortuous but nonaneurysmal. Atherosclerotic changes extend into the iliac and femoral vessels. The venous structures are not well assessed due to timing of contrast. No  adenopathy. Reproductive: Status post hysterectomy. No adnexal masses. Other: No free air. Haziness in the left abdomen as seen on axial images 69 through 72 is volume averaging off of a slip of peritoneum, which is unchanged based on coronal imaging. No free fluid. Musculoskeletal: There is a compression fracture of L3 which is unchanged since September 01, 2017. There is a mild compression fracture of T10 which is stable since September 07, 2017. No acute bony abnormalities are identified. A healed sternal fracture is noted. IMPRESSION: 1. Bibasilar infiltrates, left greater than right could represent pneumonia or aspiration. Aspiration is favored due  to fluid and debris in the lower lobe bronchi. Recommend clinical correlation and follow-up to resolution. There is also mild involvement of the right middle lobe and lingula. 2. Large hiatal hernia, unchanged. 3. Atherosclerotic changes throughout the aorta. 4. 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 5. Mildly prominent nodes in the mediastinum are likely reactive. They are stable. 6. Gallbladder sludge or stones, better seen on previous imaging. 7. Diverticulosis without diverticulitis. 8. Chronic compression fractures of T10 and L3. Aortic Atherosclerosis (ICD10-I70.0). Electronically Signed   By: Gerome Sam III M.D   On: 01/01/2018 12:16   Ct Abdomen Pelvis W Contrast  Result Date: 01/01/2018 CLINICAL DATA:  Nausea, vomiting, and diffuse abdominal pain. Recent unexplained weight loss. EXAM: CT CHEST, ABDOMEN, AND PELVIS WITH CONTRAST TECHNIQUE: Multidetector CT imaging of the chest, abdomen and pelvis was performed following the standard protocol during bolus administration of intravenous contrast. CONTRAST:  75mL OMNIPAQUE IOHEXOL 300 MG/ML  SOLN COMPARISON:  CT of the chest September 07, 2017 and CT the abdomen and pelvis September 01, 2017 FINDINGS: CT CHEST FINDINGS Cardiovascular: Atherosclerotic changes are seen in the  nonaneurysmal aorta. No dissection. Central pulmonary arteries are normal in caliber. No obvious filling defects in the visualized pulmonary arteries. The heart is unchanged. The heart is displaced anteriorly by the hiatal hernia. Mediastinum/Nodes: The thyroid is unremarkable. There is a large hiatal hernia containing at least half of the stomach. The hiatal hernia contains fluid and is unchanged in size in the interval. No wall thickening. The remainder of the esophagus is unremarkable. No pleural effusion. There is a small pericardial effusion. A 10 mm precarinal node is stable. A mildly prominent subcarinal node appears stable as well. No new adenopathy identified in the chest. Lungs/Pleura: The trachea and mainstem bronchi are normal. Upper lobe airways are normal. There is opacification of lower lobe bronchi filled with fluid and debris. Bibasilar pulmonary opacities are identified, greater on the left than the right. The lingula and right middle lobe are also involved. No pneumothorax. No pulmonary nodules or masses. Musculoskeletal: See below. CT ABDOMEN PELVIS FINDINGS Hepatobiliary: Probable tiny cysts scattered throughout the liver, too small to characterize. Hepatic steatosis. The portal vein is patent. The sludge seen in the gallbladder was better appreciated previously but likely remains. No wall thickening or pericholecystic fluid. Pancreas: Unremarkable. No pancreatic ductal dilatation or surrounding inflammatory changes. Spleen: Normal in size without focal abnormality. Adrenals/Urinary Tract: Adrenal glands are normal. Probable small renal cysts, too small to characterize. No suspicious masses. No hydronephrosis or perinephric stranding. No ureterectasis or ureteral stones. The bladder is normal. The air in the wall of bladder previously has resolved. Stomach/Bowel: There is a large hiatal hernia with over half of the stomach above the diaphragm. No gastric wall thickening. There is a large  complicated duodenal diverticulum which is stable. The remainder of the small bowel is normal. Colonic diverticulosis is seen without diverticulitis. The previously identified colitis has resolved. There is mild fecal loading in the cecum. The appendix is not visualized consistent with history of previous appendectomy. Vascular/Lymphatic: Atherosclerotic changes are identified in the abdominal aorta. 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 This appears to be stable since in June of 2016. The aorta is tortuous but nonaneurysmal. Atherosclerotic changes extend into the iliac and femoral vessels. The venous structures are not well assessed due to timing of contrast. No adenopathy. Reproductive: Status post hysterectomy. No adnexal masses. Other: No  free air. Haziness in the left abdomen as seen on axial images 69 through 72 is volume averaging off of a slip of peritoneum, which is unchanged based on coronal imaging. No free fluid. Musculoskeletal: There is a compression fracture of L3 which is unchanged since September 01, 2017. There is a mild compression fracture of T10 which is stable since September 07, 2017. No acute bony abnormalities are identified. A healed sternal fracture is noted. IMPRESSION: 1. Bibasilar infiltrates, left greater than right could represent pneumonia or aspiration. Aspiration is favored due to fluid and debris in the lower lobe bronchi. Recommend clinical correlation and follow-up to resolution. There is also mild involvement of the right middle lobe and lingula. 2. Large hiatal hernia, unchanged. 3. Atherosclerotic changes throughout the aorta. 4. 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 5. Mildly prominent nodes in the mediastinum are likely reactive. They are stable. 6. Gallbladder sludge or stones, better seen on previous imaging. 7. Diverticulosis without diverticulitis. 8. Chronic compression fractures  of T10 and L3. Aortic Atherosclerosis (ICD10-I70.0). Electronically Signed   By: Gerome Sam III M.D   On: 01/01/2018 12:16    Scheduled Meds: . feeding supplement  1 Container Oral TID BM  . fentaNYL  25 mcg Transdermal Q72H  . pneumococcal 23 valent vaccine  0.5 mL Intramuscular Tomorrow-1000  . sodium chloride flush  3 mL Intravenous Q12H   Continuous Infusions: . famotidine (PEPCID) IV Stopped (01/03/18 0902)  . piperacillin-tazobactam (ZOSYN)  IV 3.375 g (01/03/18 0912)  . vancomycin 750 mg (01/03/18 0912)    Principal Problem:   Sepsis (HCC) Active Problems:   Acute respiratory failure with hypoxia (HCC)   Aspiration pneumonia (HCC)   Hiatal hernia with GERD   Acute hypokalemia   Fibromyalgia   Arthritis   Protein calorie malnutrition (HCC)    Time spent: >35 minutes     Esperanza Sheets  [facility] Pager [phone]. If 7PM-7AM, please contact night-coverage at [email or web address], password [facility] 01/03/2018, 9:20 AM  LOS: 2 days

## 2018-01-03 NOTE — Evaluation (Signed)
Physical Therapy Evaluation Patient Details Name: Nicole Key MRN: [id number] DOB: Aug 14, 1928 Today's Date: 01/03/2018   History of Present Illness  84 y.o. female with medical history significant for severe hiatal hernia with GERD and chronic reflux with chronic cough, protein calorie malnutrition, arthritis, fibromyalgia. Presents from home with 4 days of nausea, vomiting, poor intake preceded by dysuria and lower abdominal pain without definitive source identified.  Attempted outpatient Keflex but unable to tolerate secondary to nausea and vomiting.  Since arrival patient currently experienced an aspiration event and now appears to have acute aspiration pneumonitis  Clinical Impression  PTA pt lived in 2nd floor bedroom and outfitted with sofa and refrigerator, pt able to ambulate to bathroom utilizing RW on good days, and perform bird baths and dressing, family assist with iADLs, grandson carries her up and down the stairs when needed. Family not present 24/7. Pt required maximal encouragement to participate in therapies today siting bilateral knee pain. Pt only willing to move when her bed was found to be saturated with urine. Pt requires minA for bed mobility and minA for squat pivot transfer to recliner. PT recommending SNF level rehab at d/c (pt will most likely decline). PT will continue to follow acutely until d/c.      Follow Up Recommendations SNF    Equipment Recommendations  None recommended by PT    Recommendations for Other Services       Precautions / Restrictions Precautions Precautions: Fall Restrictions Weight Bearing Restrictions: No      Mobility  Bed Mobility Overal bed mobility: Needs Assistance Bed Mobility: Supine to Sit     Supine to sit: Min assist     General bed mobility comments: min assist of bed pad to pull hips to EOB, HOB up  Transfers Overall transfer level: Needs assistance   Transfers: Sit to/from Stand;Stand Pivot Transfers Sit to  Stand: Min assist Stand pivot transfers: Min assist       General transfer comment: Pt requiring min assist to rise and steady as she pivoted bed to chair          Balance Overall balance assessment: Needs assistance   Sitting balance-Leahy Scale: Fair       Standing balance-Leahy Scale: Poor                               Pertinent Vitals/Pain Pain Assessment: Faces Faces Pain Scale: Hurts a little bit Pain Location: knees Pain Descriptors / Indicators: Sore Pain Intervention(s): Limited activity within patient's tolerance;Monitored during session;Repositioned    Home Living Family/patient expects to be discharged to:: Private residence Living Arrangements: Other relatives(nephew) Available Help at Discharge: Family;Available PRN/intermittently(niece and nephew) Type of Home: House Home Access: Stairs to enter Entrance Stairs-Rails: Right Entrance Stairs-Number of Steps: 3 Home Layout: Two level Home Equipment: Walker - 2 wheels;Bedside commode;Wheelchair - manual;Cane - single point Additional Comments: pt reports having a refrigerator and microwave and retrieving meals prepared by her nephew    Prior Function Level of Independence: Needs assistance   Gait / Transfers Assistance Needed: ambulates [place] distances with RW  ADL's / Homemaking Assistance Needed: pt reports sponge bathing, dressing and toileting independently        Hand Dominance   Dominant Hand: Left    Extremity/Trunk Assessment   Upper Extremity Assessment Upper Extremity Assessment: Defer to OT evaluation    Lower Extremity Assessment Lower Extremity Assessment: Generalized weakness(c/o of bilateral knee pain, reduced  ROM)    Cervical / Trunk Assessment Cervical / Trunk Assessment: Kyphotic  Communication   Communication: HOH  Cognition Arousal/Alertness: Awake/alert Behavior During Therapy: WFL for tasks assessed/performed Overall Cognitive Status: Impaired/Different  from baseline Area of Impairment: Safety/judgement                         Safety/Judgement: Decreased awareness of safety;Decreased awareness of deficits            General Comments General comments (skin integrity, edema, etc.): Pt on 2L O2 via nasal cannula at entry, SaO2 96%O2. Pt does not use supplemental O2 at home, Removed nasal cannula and pt was able to maintain SaO2 of >93%O2 throughout session. prior to activity HR 68 bpm with activity HR rose to 123 bpm returned to 76 bpm with sitting in recliner        Assessment/Plan    PT Assessment Patient needs continued PT services  PT Problem List Decreased strength;Decreased range of motion;Decreased activity tolerance;Decreased balance;Decreased mobility;Pain       PT Treatment Interventions Gait training;Functional mobility training;Therapeutic activities;Therapeutic exercise;Balance training;Cognitive remediation;Patient/family education;DME instruction    PT Goals (Current goals can be found in the Care Plan section)  Acute Rehab PT Goals Patient Stated Goal: to go home PT Goal Formulation: With patient Potential to Achieve Goals: Poor    Frequency Min 2X/week   Barriers to discharge Inaccessible home environment      Co-evaluation PT/OT/SLP Co-Evaluation/Treatment: Yes Reason for Co-Treatment: For patient/therapist safety PT goals addressed during session: Mobility/safety with mobility OT goals addressed during session: ADL's and self-care       AM-PAC PT "6 Clicks" Daily Activity  Outcome Measure Difficulty turning over in bed (including adjusting bedclothes, sheets and blankets)?: A Little Difficulty moving from lying on back to sitting on the side of the bed? : Unable Difficulty sitting down on and standing up from a chair with arms (e.g., wheelchair, bedside commode, etc,.)?: Unable Help needed moving to and from a bed to chair (including a wheelchair)?: A Little Help needed walking in hospital  room?: A Lot Help needed climbing 3-5 steps with a railing? : Total 6 Click Score: 11    End of Session   Activity Tolerance: Patient tolerated treatment well Patient left: in chair;with call bell/phone within reach;with chair alarm set;Other (comment)(SLP in room) Nurse Communication: Mobility status PT Visit Diagnosis: Unsteadiness on feet (R26.81);Other abnormalities of gait and mobility (R26.89);Muscle weakness (generalized) (M62.81);Difficulty in walking, not elsewhere classified (R26.2);Pain Pain - Right/Left: (bilateral ) Pain - part of body: Knee    Time: 1001-1029 PT Time Calculation (min) (ACUTE ONLY): 28 min   Charges:   PT Evaluation $PT Eval Moderate Complexity: 1 Mod     PT G Codes:        Desiree Daise B. Beverely Risen PT, DPT Acute Rehabilitation  [phone] Pager [phone]    Elon Alas Fleet 01/03/2018, 11:41 AM

## 2018-01-03 NOTE — Progress Notes (Signed)
CSW met with pt and pt nieces at bedside to discuss PT recommendation for SNF.  Pt is not agreeable to SNF and feels safe returning home- lives with niece, grandson, and dtr who help with care- they also feel safe with pt return home  Uses [facility] for home [facility] services- would like this restarted at [place] updated [place] signing off  Jorge Ny, [place] Worker [phone]

## 2018-01-03 NOTE — Evaluation (Signed)
Occupational Therapy Evaluation Patient Details Name: Nicole Key MRN: [id number] DOB: July 25, 1928 Today's Date: 01/03/2018    History of Present Illness 84 y.o. female with medical history significant for severe hiatal hernia with GERD and chronic reflux with chronic cough, protein calorie malnutrition, arthritis, fibromyalgia. Presents from home with 4 days of nausea, vomiting, poor intake preceded by dysuria and lower abdominal pain without definitive source identified.  Attempted outpatient Keflex but unable to tolerate secondary to nausea and vomiting.  Since arrival patient currently experienced an aspiration event and now appears to have acute aspiration pneumonitis   Clinical Impression   Pt reports walking [place] distances with RW in her room/bathroom and being able to sponge bathe, dress and toilet independently. Pt is carried upstairs by her nephew. Pt presents with generalized weakness, poor activity tolerance and impaired standing balance. Pt required maximum encouragement to work with therapies, reporting pain and fatigue. Recommending SNF for rehab, but pt likely to refuse. Will follow acutely.    Follow Up Recommendations  SNF;Supervision/Assistance - 24 hour(HHOT if pt continues to refuse SNF)    Equipment Recommendations  None recommended by OT    Recommendations for Other Services       Precautions / Restrictions Precautions Precautions: Fall Restrictions Weight Bearing Restrictions: No      Mobility Bed Mobility Overal bed mobility: Needs Assistance Bed Mobility: Supine to Sit     Supine to sit: Min assist     General bed mobility comments: min assist of bed pad to pull hips to EOB, HOB up  Transfers Overall transfer level: Needs assistance   Transfers: Sit to/from Stand;Stand Pivot Transfers Sit to Stand: Min assist Stand pivot transfers: Min assist       General transfer comment: Pt requiring min assist to rise and steady as she pivoted bed to  chair    Balance Overall balance assessment: Needs assistance   Sitting balance-Leahy Scale: Fair       Standing balance-Leahy Scale: Poor                             ADL either performed or assessed with clinical judgement   ADL Overall ADL's : Needs assistance/impaired Eating/Feeding: Set up;Bed level   Grooming: Sitting;Moderate assistance   Upper Body Bathing: Minimal assistance;Sitting   Lower Body Bathing: Maximal assistance;Sit to/from stand   Upper Body Dressing : Moderate assistance;Sitting   Lower Body Dressing: Maximal assistance;Sit to/from stand   Toilet Transfer: Minimal Cabin crew Details (indicate cue type and reason): simulated to chair Toileting- Clothing Manipulation and Hygiene: Maximal assistance;Sit to/from stand       Functional mobility during ADLs: (pt not able to ambulate this visit) General ADL Comments: Pt in wet bed and aware, but did not call for assist.     Vision Patient Visual Report: No change from baseline       Perception     Praxis      Pertinent Vitals/Pain Pain Assessment: Faces Faces Pain Scale: Hurts a little bit Pain Location: pt uncomfortable in chair Pain Descriptors / Indicators: Discomfort Pain Intervention(s): Limited activity within patient's tolerance;Monitored during session;Repositioned     Hand Dominance Left   Extremity/Trunk Assessment Upper Extremity Assessment Upper Extremity Assessment: Generalized weakness   Lower Extremity Assessment Lower Extremity Assessment: Defer to PT evaluation   Cervical / Trunk Assessment Cervical / Trunk Assessment: Kyphotic   Communication Communication Communication: HOH   Cognition Arousal/Alertness: Awake/alert Behavior During  Therapy: WFL for tasks assessed/performed Overall Cognitive Status: Impaired/Different from baseline Area of Impairment: Safety/judgement                         Safety/Judgement:  Decreased awareness of safety;Decreased awareness of deficits         General Comments       Exercises     Shoulder Instructions      Home Living Family/patient expects to be discharged to:: Private residence Living Arrangements: Other relatives(nephew) Available Help at Discharge: Family;Available PRN/intermittently(niece and nephew) Type of Home: House Home Access: Stairs to enter [employer] of Steps: 3 Entrance Stairs-Rails: Right Home Layout: Two level Alternate Level Stairs-Number of Steps: bedroom is upstairs, has assist to negotiate, but doesn't usually come downstairs   Bathroom Shower/Tub: Tub/shower unit(sponge bathes)   Bathroom Toilet: Standard     Home Equipment: Environmental consultant - 2 wheels;Bedside commode;Wheelchair - manual;Cane - single point   Additional Comments: pt reports having a refrigerator and microwave and retrieving meals prepared by her nephew      Prior Functioning/Environment Level of Independence: Needs assistance  Gait / Transfers Assistance Needed: ambulates [place] distances with RW ADL's / Homemaking Assistance Needed: pt reports sponge bathing, dressing and toileting independently            OT Problem List: Decreased strength;Decreased activity tolerance;Impaired balance (sitting and/or standing);Decreased knowledge of use of DME or AE;Decreased safety awareness;Pain      OT Treatment/Interventions: Self-care/ADL training;DME and/or AE instruction;Patient/family education;Balance training    OT Goals(Current goals can be found in the care plan section) Acute Rehab OT Goals Patient Stated Goal: to go home OT Goal Formulation: With patient Time For Goal Achievement: 01/17/18 Potential to Achieve Goals: Fair ADL Goals Pt Will Perform Grooming: with supervision;with set-up;sitting Pt Will Perform Upper Body Bathing: with supervision;sitting;with set-up Pt Will Perform Lower Body Bathing: with supervision;sit to/from stand Pt  Will Perform Upper Body Dressing: with set-up;with supervision;sitting Pt Will Perform Lower Body Dressing: with supervision;sit to/from stand;with set-up Pt Will Transfer to Toilet: with supervision;ambulating;bedside commode Pt Will Perform Toileting - Clothing Manipulation and hygiene: with supervision;sit to/from stand  OT Frequency: Min 2X/week   Barriers to D/C: Decreased caregiver support;Inaccessible home environment          Co-evaluation PT/OT/SLP Co-Evaluation/Treatment: Yes Reason for Co-Treatment: For patient/therapist safety   OT goals addressed during session: ADL's and self-care      AM-PAC PT "6 Clicks" Daily Activity     Outcome Measure Help from another person eating meals?: None Help from another person taking care of personal grooming?: A Lot Help from another person toileting, which includes using toliet, bedpan, or urinal?: A Lot Help from another person bathing (including washing, rinsing, drying)?: A Lot Help from another person to put on and taking off regular upper body clothing?: A Little Help from another person to put on and taking off regular lower body clothing?: A Lot 6 Click Score: 15   End of Session Equipment Utilized During Treatment: Oxygen  Activity Tolerance: Patient limited by fatigue Patient left: in chair;with call bell/phone within reach(ST in room)  OT Visit Diagnosis: Unsteadiness on feet (R26.81);Pain                Time: 1002-1029 OT Time Calculation (min): 27 min Charges:  OT General Charges $OT Visit: 1 Visit OT Evaluation $OT Eval Moderate Complexity: 1 Mod G-Codes:     01-20-18 Martie Round, OTR/L Pager: [phone]  Tzion Wedel,  [place] Bailiff 01/03/2018, 11:24 AM

## 2018-01-03 NOTE — Progress Notes (Signed)
Daily Progress Note   Patient Name: Nicole Key       Date: 01/03/2018 DOB: 01-07-1928  Age: 84 y.o. MRN#: [id number] Attending Physician: Esperanza Sheets, MD Primary Care Physician: Geoffry Paradise, MD Admit Date: 12/31/2017  Reason for Consultation/Follow-up: Establishing goals of care and Hospice Evaluation  Subjective: Feels better than yesterday  Length of Stay: 2  Current Medications: Scheduled Meds:  . feeding supplement  1 Container Oral TID BM  . fentaNYL  25 mcg Transdermal Q72H  . pneumococcal 23 valent vaccine  0.5 mL Intramuscular Tomorrow-1000  . sodium chloride flush  3 mL Intravenous Q12H    Continuous Infusions: . famotidine (PEPCID) IV Stopped (01/03/18 0902)  . piperacillin-tazobactam (ZOSYN)  IV 3.375 g (01/03/18 0912)  . vancomycin Stopped (01/03/18 1018)    PRN Meds: acetaminophen **OR** acetaminophen, ALPRAZolam, levalbuterol, morphine injection, ondansetron **OR** ondansetron (ZOFRAN) IV  Physical Exam  Constitutional: She is oriented to person, place, and time. No distress.  HENT:  Head: Normocephalic and atraumatic.  Cardiovascular: Normal rate and regular rhythm.  Pulmonary/Chest: Effort normal. No accessory muscle usage. No tachypnea. No respiratory distress.  Abdominal: Soft.  Neurological: She is alert and oriented to person, place, and time.  Skin: Skin is warm and dry.  Psychiatric: She has a normal mood and affect.            Vital Signs: BP (!) 142/73   Pulse 67   Temp 98 F (36.7 C) (Oral)   Resp 20   Ht  (1.702 m)   Wt 48.9 kg (107 lb 12.9 oz)   SpO2 92%   BMI 16.88 kg/m  SpO2: SpO2: 92 % O2 Device: O2 Device: Nasal Cannula O2 Flow Rate: O2 Flow Rate (L/min): 2 L/min  Intake/output summary:   Intake/Output Summary (Last 24  hours) at 01/03/2018 1133 Last data filed at 01/03/2018 4098 Gross per 24 hour  Intake 1506.33 ml  Output 50 ml  Net 1456.33 ml   LBM: Last BM Date: 12/31/17 Baseline Weight: Weight: 44.5 kg (98 lb) Most recent weight: Weight: 48.9 kg (107 lb 12.9 oz)       Palliative Assessment/Data: PPS 50%    Flowsheet Rows     Most Recent Value  Intake Tab  Referral Department  Hospitalist  Unit at Time of Referral  Intermediate Care Unit  Palliative Care Primary Diagnosis  Pulmonary  Date Notified  01/01/18  Palliative Care Type  New Palliative care  Reason for referral  Clarify Goals of Care  Date of Admission  12/31/17  Date first seen by Palliative Care  01/02/18  # of days Palliative referral response time  1 Day(s)  # of days IP prior to Palliative referral  1  Clinical Assessment  Palliative Performance Scale Score  50%  Psychosocial & Spiritual Assessment  Palliative Care Outcomes  Patient/Family meeting held?  Yes  Who was at the meeting?  grandson [name] and niece [facility]  Palliative Care Outcomes  Provided psychosocial or spiritual support, Changed CPR status, Counseled regarding hospice, Improved pain interventions, Clarified goals of care, Completed durable DNR  Patient/Family wishes: Interventions discontinued/not started   Mechanical Ventilation, Vasopressors      Patient Active  Problem List   Diagnosis Date Noted  . Acute respiratory failure with hypoxia (HCC) 01/01/2018  . Aspiration pneumonia (HCC) 01/01/2018  . Hiatal hernia with GERD 01/01/2018  . Acute hypokalemia 01/01/2018  . Fibromyalgia 01/01/2018  . Arthritis 01/01/2018  . Protein calorie malnutrition (HCC) 01/01/2018  . Pleural effusion   . At risk for aspiration   . Protein-calorie malnutrition (HCC)   . Palliative care by specialist   . Goals of care, counseling/discussion   . GI bleed 09/01/2017  . Hematemesis 09/01/2017  . Melena 09/01/2017  . Hypokalemia 09/01/2017  . Leukocytosis 09/01/2017  .  Elevated lactic acid level 09/01/2017  . Intractable nausea and vomiting 09/01/2017  . Tachycardia 09/01/2017  . Dysphagia   . AKI (acute kidney injury) (HCC) 03/09/2017  . Severely underweight adult 03/09/2017  . Abnormal weight loss 03/09/2017  . Cerebrovascular disease 03/09/2017  . Globus sensation 03/09/2017  . Chest pain, rule out acute myocardial infarction 03/08/2017  . Hiatal hernia 02/07/2015  . Sepsis (HCC) 02/04/2015  . Anxiety 02/04/2015  . Essential hypertension 02/04/2015  . GERD (gastroesophageal reflux disease) 02/04/2015  . Protein-calorie malnutrition, severe (HCC) 02/04/2015  . Unstable gait 08/15/2013  . Depression 08/15/2013  . Osteoporosis 07/18/2011  . Meniere's disease 07/18/2011    Palliative Care Assessment & Plan   HPI: 84 y.o. female  with past medical history of fibromyalgia, incarcerated hiatal hernia with obstruction in 2016, Meniere's, HTN, GERD, aspiration pneumonia, arthritis, anxiety, gi bleeding, and malnutrition admitted on 12/31/2017 with aspiration pna and sepsis. She was recentlydiagnosed with a UTI and prescribed Keflex but was unable to tolerate it. She was having N/V in the ED and appears to have aspirated.  PMT consulted for GOC.  Assessment: Follow up with patient. No family at bedside.  She tells me her pain is well controlled with morphine and anxiety controlled with xanax. C/o pain all over but worst in bilateral knees. She tells me she does not think fentanyl patch is working. Takes norco at home for pain. She is comfortable with plan moving forward - [facility] then hospice.   Emotional support provided. All questions and concerns addressed. Encouraged to call with any further questions.   Recommendations/Plan: - Patient's goals are: to be at home and not be rehospitalized - Patient would like to return home with [facility] home [facility] ("to get as strong as possible") then transition to hospice at home -  patient/family advised to call PCP for hospice referral when ready - fentanyl patch and IV morphine for pain per primary - patient takes norco at home -Continue xanax for anxiety - DNR form completed and placed in chart  Goals of Care and Additional Recommendations:  Limitations on Scope of Treatment: Avoid Hospitalization  Code Status:  DNR  Prognosis:   Unable to determine - poor prognosis r/t PPS 50%, albumin 2.4, poor baseline functional status, risk for aspiration pna  Discharge Planning:  Home with Home Health, then transition to hospice  Care plan was discussed with patient and nurse  Thank you for allowing the Palliative Medicine Team to assist in the care of this patient.   Time In: 1030 Time Out: 1045 Total Time 15 minutes Prolonged Time Billed  no       Greater than 50%  of this time was spent counseling and coordinating care related to the above assessment and plan.  Gerlean Ren, DNP, AGNP-C Palliative Medicine Team Team Phone # [phone]

## 2018-01-04 DIAGNOSIS — M797 Fibromyalgia: Secondary | ICD-10-CM

## 2018-01-04 DIAGNOSIS — E43 Unspecified severe protein-calorie malnutrition: Secondary | ICD-10-CM

## 2018-01-04 DIAGNOSIS — M1990 Unspecified osteoarthritis, unspecified site: Secondary | ICD-10-CM

## 2018-01-04 LAB — CBC
HCT: 31.1 % — ABNORMAL LOW (ref 36.0–46.0)
HEMOGLOBIN: 9.9 g/dL — AB (ref 12.0–15.0)
MCH: 27.9 pg (ref 26.0–34.0)
MCHC: 31.8 g/dL (ref 30.0–36.0)
MCV: 87.6 fL (ref 78.0–100.0)
PLATELETS: 281 10*3/uL (ref 150–400)
RBC: 3.55 MIL/uL — AB (ref 3.87–5.11)
RDW: 15.3 % (ref 11.5–15.5)
WBC: 18.4 10*3/uL — ABNORMAL HIGH (ref 4.0–10.5)

## 2018-01-04 MED ORDER — MORPHINE SULFATE (PF) 2 MG/ML IV SOLN
2.0000 mg | INTRAVENOUS | Status: DC | PRN
Start: 2018-01-04 — End: 2018-01-05
  Administered 2018-01-04 – 2018-01-05 (×6): 2 mg via INTRAVENOUS
  Filled 2018-01-04 (×6): qty 1

## 2018-01-04 MED ORDER — OXYCODONE HCL 5 MG PO TABS
5.0000 mg | ORAL_TABLET | ORAL | Status: DC | PRN
  Administered 2018-01-04 – 2018-01-05 (×2): 5 mg via ORAL
  Filled 2018-01-04 (×2): qty 1

## 2018-01-04 MED ORDER — MECLIZINE HCL 25 MG PO TABS: 25.0000 mg | ORAL_TABLET | Freq: Three times a day (TID) | ORAL | Status: DC | PRN

## 2018-01-04 MED ORDER — HYOSCYAMINE SULFATE 0.125 MG PO TABS
0.1250 mg | ORAL_TABLET | ORAL | Status: DC | PRN
  Filled 2018-01-04: qty 1

## 2018-01-04 MED ORDER — DICLOFENAC SODIUM 1 % TD GEL
2.0000 g | Freq: Four times a day (QID) | TRANSDERMAL | Status: DC
  Administered 2018-01-04 – 2018-01-09 (×21): 2 g via TOPICAL
  Filled 2018-01-04: qty 100

## 2018-01-04 MED ORDER — AMOXICILLIN-POT CLAVULANATE 875-125 MG PO TABS: 1.0000 | ORAL_TABLET | Freq: Two times a day (BID) | ORAL | Status: DC

## 2018-01-04 MED ORDER — AMOXICILLIN-POT CLAVULANATE 500-125 MG PO TABS
1.0000 | ORAL_TABLET | Freq: Two times a day (BID) | ORAL | Status: AC
  Administered 2018-01-04 – 2018-01-07 (×8): 500 mg via ORAL
  Filled 2018-01-04 (×8): qty 1

## 2018-01-04 MED ORDER — LEVALBUTEROL HCL 0.63 MG/3ML IN NEBU: 0.6300 mg | INHALATION_SOLUTION | RESPIRATORY_TRACT | Status: DC | PRN

## 2018-01-04 MED ORDER — SUCRALFATE 1 G PO TABS
1.0000 g | ORAL_TABLET | Freq: Three times a day (TID) | ORAL | Status: DC
  Administered 2018-01-04 – 2018-01-09 (×21): 1 g via ORAL
  Filled 2018-01-04 (×20): qty 1

## 2018-01-04 MED ORDER — HYDROCODONE-ACETAMINOPHEN 5-325 MG PO TABS: 1.0000 | ORAL_TABLET | Freq: Three times a day (TID) | ORAL | Status: DC | PRN

## 2018-01-04 MED ORDER — POTASSIUM CHLORIDE CRYS ER 20 MEQ PO TBCR
20.0000 meq | EXTENDED_RELEASE_TABLET | Freq: Two times a day (BID) | ORAL | Status: AC
  Administered 2018-01-04 – 2018-01-05 (×3): 20 meq via ORAL
  Filled 2018-01-04 (×3): qty 1

## 2018-01-04 MED ORDER — PANTOPRAZOLE SODIUM 40 MG PO TBEC
40.0000 mg | DELAYED_RELEASE_TABLET | Freq: Two times a day (BID) | ORAL | Status: DC
  Administered 2018-01-04 – 2018-01-09 (×10): 40 mg via ORAL
  Filled 2018-01-04 (×10): qty 1

## 2018-01-04 MED ORDER — ACETAMINOPHEN 325 MG PO TABS
650.0000 mg | ORAL_TABLET | ORAL | Status: DC | PRN
  Administered 2018-01-07 – 2018-01-08 (×2): 650 mg via ORAL
  Filled 2018-01-04 (×2): qty 2

## 2018-01-04 MED ORDER — ACETAMINOPHEN 325 MG PO TABS: 650.0000 mg | ORAL_TABLET | Freq: Four times a day (QID) | ORAL | Status: DC

## 2018-01-04 NOTE — Progress Notes (Signed)
  Speech Language Pathology Treatment: Dysphagia  Patient Details Name: Nicole Key MRN: [id number] DOB: November 10, 1927 Today's Date: 01/04/2018 Time: 4098-1191 SLP Time Calculation (min) (ACUTE ONLY): 12 min  Assessment / Plan / Recommendation Clinical Impression  Pt describes limited intake but adequate tolerance of advanced diet textures. Although most of the food she is attempting is pureed, she is eating some of the soft, chopped foods. She says that her intake at home is usually limited as well but that this diet is consistent with the textures she chooses. She consumed thin liquids this morning with delayed cough noted, but vocal quality was clear immediately post-swallow. She verbalizes her recommended swallowing precautions with Mod I, although also acknowledging that it is sometimes hard to comply with them. SLP will continue to follow acutely, but do not anticipate needs upon discharge given chronic nature of dysphagia and overall pt/family goals.   HPI HPI: Pt is a 84 y.o. female with medical history significant for severe hiatal hernia with GERD and chronic reflux with chronic cough, protein calorie malnutrition, arthritis, fibromyalgia. A few days PTA she was started on tx for UTI symptoms  but developed emesis. She is admitted with sepsis. There was concern for aspiration during an episode of emesis in the ED with CT Chest showing bibasilar infiltrates L > R concerning for aspiration due to fluid and debris in the lower lobes. Pt has had several clinical swallowing evaluations in the past, all suggestive of adequate oropharyngeal function but with concern for a primary esophageal dysphagia.      SLP Plan  Continue with current plan of care       Recommendations  Diet recommendations: Dysphagia 2 (fine chop);Thin liquid Liquids provided via: Cup;Straw Medication Administration: Whole meds with puree Supervision: Patient able to self feed;Intermittent supervision to cue for  compensatory strategies Compensations: Slow rate;Small sips/bites;Follow solids with liquid Postural Changes and/or Swallow Maneuvers: Seated upright 90 degrees;Upright 30-60 min after meal                Oral Care Recommendations: Oral care BID Follow up Recommendations: None SLP Visit Diagnosis: Dysphagia, unspecified (R13.10) Plan: Continue with current plan of care       GO                Maxcine Ham 01/04/2018, 10:58 AM  Maxcine Ham, M.A. CCC-SLP [phone]

## 2018-01-04 NOTE — Progress Notes (Signed)
OT Cancellation Note  Patient Details Name: Nicole Key MRN: [id number] DOB: [birth year]   Cancelled Treatment:    Reason Eval/Treat Not Completed: Patient declined, no reason specified; pt reports not feeling well and with bil knee pain, reporting feeling worse than yesterday. Pt's family present and also in agreement to allow pt to rest. Will follow up as schedule allows.   Marcy Siren, OT Pager [phone] 01/04/2018   Nicole Key 01/04/2018, 11:48 AM

## 2018-01-04 NOTE — Progress Notes (Signed)
[facility] TEAM 1 - Stepdown/ICU DIAMANTE TRUSZKOWSKI  LKG:[id number] DOB: 1928/04/30 DOA: 12/31/2017 PCP: Geoffry Paradise, MD    Brief Narrative:  84 y.o.femalew/ a hx of alarge hiatal hernia,incarcerated hiatal hernia with obstruction, aspiration pneumonia, HTN, fibromyalgia, anxiety, GERD, malnutrition, and gi bleeding who was admitted 12/31/17 with aspiration pneumonia and sepsis. Patient had been diagnosed with a UTI and put on Keflex but developed emesis.  While in the ED she suffered an apneic event associated with aspiration during a bout of emesis.    Subjective: The patient is resting in bed.  She reports that she "just does not feel good today".  She also reports nagging neck and bilateral shoulder pain which represents an acute exacerbation of her chronic pain.  She denies nausea vomiting or abdominal pain.  Assessment & Plan:  Sepsis due to aspiration pneumonia / pneumonitis  Has completed 4 days of abx tx - WBC trending down - stop Vanc - transition to oral abx to complete 7 days of tx   Acute hypoxic respiratory failure due to aspiration Stabilizing - wean off O2 as able   Large HH - GERD - recurrent aspiration   PPI  Anemia Hg stable - no s/s of acute bleeding   Hypomagnesemia / Hypokalemia / Hypophosphatemia Due to poor nutritional state - corrected w/ supplementation   HTN BP controlled - goal is to avoid extremes - no indication for strict BP control   Fibromyalgia Adjust pain meds due to acute exacerbation   Severe malnutrition in context of chronic illness - Hypoalbuminemia  DVT prophylaxis: SCDs Code Status: DNR - NO CODE Family Communication: spoke w/ family at bedside  Disposition Plan: home w/ [facility] w/ ultimate goal for hospice care and no further hospitalization - ?D/C 24-48hrs   Consultants:  Palliative Care   Antimicrobials:  Zosyn 5/7 > Vancomycin 5/7 >  Objective: Blood pressure (!) 152/65, pulse 86, temperature 98.9 F (37.2 C),  temperature source Oral, resp. rate (!) 22, height  (1.702 m), weight 52 kg (114 lb 10.2 oz), SpO2 96 %.  Intake/Output Summary (Last 24 hours) at 01/04/2018 1031 Last data filed at 01/04/2018 0900 Gross per 24 hour  Intake 986 ml  Output 200 ml  Net 786 ml   Filed Weights   01/02/18 0500 01/03/18 0423 01/04/18 0500  Weight: 47.5 kg (104 lb 11.5 oz) 48.9 kg (107 lb 12.9 oz) 52 kg (114 lb 10.2 oz)    Examination: General: No acute respiratory distress Lungs: Bibasilar crackles with no wheezing Cardiovascular: Regular rate and rhythm without murmur gallop or rub  Abdomen: Nontender, thin, soft, bowel sounds positive, no rebound, no ascites, no appreciable mass Extremities: No significant cyanosis, clubbing, or edema bilateral lower extremities  CBC: Recent Labs  Lab 01/01/18 0746 01/02/18 0228 01/03/18 0204 01/04/18 0325  WBC 24.4* 20.1* 21.8* 18.4*  NEUTROABS 20.9*  --   --   --   HGB 13.5 9.9* 11.5* 9.9*  HCT 40.7 31.1* 35.3* 31.1*  MCV 87.2 88.1 89.4 87.6  PLT 383 236 240 281   Basic Metabolic Panel: Recent Labs  Lab 12/31/17 2216 01/01/18 1055 01/02/18 0228  NA 138  --  142  K 3.1*  --  3.4*  CL 95*  --  108  CO2 29  --  28  GLUCOSE 120*  --  110*  BUN 17  --  17  CREATININE 0.71  --  0.66  CALCIUM 9.3  --  8.0*  MG  --  1.5* 2.0  PHOS  --  3.0  --    GFR: Estimated Creatinine Clearance: 40.7 mL/min (by C-G formula based on SCr of 0.66 mg/dL).  Liver Function Tests: Recent Labs  Lab 12/31/17 2216 01/02/18 0228  AST 15 10*  ALT 10* 8*  ALKPHOS 46 34*  BILITOT 0.7 0.6  PROT 7.8 5.3*  ALBUMIN 3.5 2.4*   Recent Labs  Lab 12/31/17 2216  LIPASE 21   Coagulation Profile: Recent Labs  Lab 01/01/18 1055  INR 1.13    Recent Results (from the past 240 hour(s))  Culture, blood (routine x 2)     Status: None (Preliminary result)   Collection Time: 01/01/18  6:58 AM  Result Value Ref Range Status   Specimen Description BLOOD RIGHT ANTECUBITAL   Final   Special Requests   Final    BOTTLES DRAWN AEROBIC AND ANAEROBIC Blood Culture adequate volume   Culture   Final    NO GROWTH 3 DAYS Performed at [facility] Lab, [address], [place], [place] [address]    Report Status PENDING  Incomplete  Culture, blood (routine x 2)     Status: None (Preliminary result)   Collection Time: 01/01/18  7:03 AM  Result Value Ref Range Status   Specimen Description BLOOD LEFT ANTECUBITAL  Final   Special Requests   Final    BOTTLES DRAWN AEROBIC ONLY Blood Culture results may not be optimal due to an inadequate volume of blood received in culture bottles   Culture   Final    NO GROWTH 3 DAYS Performed at [facility] Lab, [address] [place], [place] [address]    Report Status PENDING  Incomplete  MRSA PCR Screening     Status: None   Collection Time: 01/01/18  3:31 PM  Result Value Ref Range Status   MRSA by PCR NEGATIVE NEGATIVE Final    Comment:        The GeneXpert MRSA Assay (FDA approved for NASAL specimens only), is one component of a comprehensive MRSA colonization surveillance program. It is not intended to diagnose MRSA infection nor to guide or monitor treatment for MRSA infections. Performed at [facility] Lab, [address], [place], [place] [address]   Culture, Urine     Status: None   Collection Time: 01/01/18  5:07 PM  Result Value Ref Range Status   Specimen Description URINE, CATHETERIZED  Final   Special Requests NONE  Final   Culture   Final    NO GROWTH Performed at [facility] Lab, [address] [place], [place] [address]    Report Status 01/02/2018 FINAL  Final     Scheduled Meds: . acetaminophen  650 mg Oral Q6H  . diclofenac sodium  2 g Topical QID  . feeding supplement  1 Container Oral TID BM  . fentaNYL  25 mcg Transdermal Q72H  . pneumococcal 23 valent vaccine  0.5 mL Intramuscular Tomorrow-1000  . sodium chloride flush  3 mL Intravenous Q12H     LOS: 3 days   Lonia Blood, MD [facility] Office  [phone] Pager - Text Page per Amion as per below:  On-Call/Text Page:      [email or web address]      password [email or web address]  If 7PM-7AM, please contact night-coverage [email or web address] Password [email or web address] 01/04/2018, 10:31 AM

## 2018-01-04 NOTE — Progress Notes (Signed)
Daily Progress Note   Patient Name: Nicole Key       Date: 01/04/2018 DOB: 09-11-1927  Age: 84 y.o. MRN#: [id number] Attending Physician: Nicole Blood, MD Primary Care Physician: Nicole Paradise, MD Admit Date: 12/31/2017  Reason for Consultation/Follow-up: Establishing goals of care and Hospice Evaluation  Subjective: C/o of pain in left arm, chronic, feels better overall  Length of Stay: 3  Current Medications: Scheduled Meds:  . acetaminophen  650 mg Oral Q6H  . diclofenac sodium  2 g Topical QID  . feeding supplement  1 Container Oral TID BM  . fentaNYL  25 mcg Transdermal Q72H  . pneumococcal 23 valent vaccine  0.5 mL Intramuscular Tomorrow-1000  . sodium chloride flush  3 mL Intravenous Q12H    Continuous Infusions: . famotidine (PEPCID) IV Stopped (01/04/18 0929)  . piperacillin-tazobactam (ZOSYN)  IV 3.375 g (01/04/18 0859)  . vancomycin Stopped (01/04/18 0959)    PRN Meds: ALPRAZolam, levalbuterol, morphine injection, ondansetron **OR** ondansetron (ZOFRAN) IV  Physical Exam  Constitutional: She is oriented to person, place, and time. She has a sickly appearance. No distress.  HENT:  Head: Normocephalic and atraumatic.  Cardiovascular: Regular rhythm.  Pulmonary/Chest: No accessory muscle usage. No tachypnea. No respiratory distress. She has decreased breath sounds.  Abdominal: Soft.  Neurological: She is alert and oriented to person, place, and time.  Skin: Skin is warm and dry.  Psychiatric: She has a normal mood and affect. Her behavior is normal.            Vital Signs: BP (!) 152/65   Pulse 86   Temp 98.9 F (37.2 C) (Oral)   Resp (!) 22   Ht  (1.702 m)   Wt 52 kg (114 lb 10.2 oz)   SpO2 96%   BMI 17.96 kg/m  SpO2: SpO2: 96 % O2 Device: O2  Device: Nasal Cannula O2 Flow Rate: O2 Flow Rate (L/min): 2 L/min  Intake/output summary:   Intake/Output Summary (Last 24 hours) at 01/04/2018 1007 Last data filed at 01/04/2018 0900 Gross per 24 hour  Intake 746 ml  Output 200 ml  Net 546 ml   LBM: Last BM Date: 12/31/17 Baseline Weight: Weight: 44.5 kg (98 lb) Most recent weight: Weight: 52 kg (114 lb 10.2 oz)       Palliative Assessment/Data: PPS 50%    Flowsheet Rows     Most Recent Value  Intake Tab  Referral Department  Hospitalist  Unit at Time of Referral  Intermediate Care Unit  Palliative Care Primary Diagnosis  Pulmonary  Date Notified  01/01/18  Palliative Care Type  New Palliative care  Reason for referral  Clarify Goals of Care  Date of Admission  12/31/17  Date first seen by Palliative Care  01/02/18  # of days Palliative referral response time  1 Day(s)  # of days IP prior to Palliative referral  1  Clinical Assessment  Palliative Performance Scale Score  50%  Psychosocial & Spiritual Assessment  Palliative Care Outcomes  Patient/Family meeting held?  Yes  Who was at the meeting?  grandson Nicole Key and niece Nicole Key  Palliative Care Outcomes  Provided psychosocial or spiritual support,Nicole Key status, Counseled  regarding hospice, Improved pain interventions, Clarified goals of care, Completed durable DNR  Patient/Family wishes: Interventions discontinued/not started   Mechanical Ventilation, Vasopressors      Patient Active Problem List   Diagnosis Date Noted  . Arthralgia of both lower legs   . Acute respiratory failure with hypoxia (HCC) 01/01/2018  . Aspiration pneumonia (HCC) 01/01/2018  . Hiatal hernia with GERD 01/01/2018  . Acute hypokalemia 01/01/2018  . Fibromyalgia 01/01/2018  . Arthritis 01/01/2018  . Protein calorie malnutrition (HCC) 01/01/2018  . Pleural effusion   . At risk for aspiration   . Protein-calorie malnutrition (HCC)   . Palliative care by specialist   . Goals of care,  counseling/discussion   . GI bleed 09/01/2017  . Hematemesis 09/01/2017  . Melena 09/01/2017  . Hypokalemia 09/01/2017  . Leukocytosis 09/01/2017  . Elevated lactic acid level 09/01/2017  . Intractable nausea and vomiting 09/01/2017  . Tachycardia 09/01/2017  . Dysphagia   . AKI (acute kidney injury) (HCC) 03/09/2017  . Severely underweight adult 03/09/2017  . Abnormal weight loss 03/09/2017  . Cerebrovascular disease 03/09/2017  . Globus sensation 03/09/2017  . Chest pain, rule out acute myocardial infarction 03/08/2017  . Hiatal hernia 02/07/2015  . Sepsis (HCC) 02/04/2015  . Anxiety 02/04/2015  . Essential hypertension 02/04/2015  . GERD (gastroesophageal reflux disease) 02/04/2015  . Protein-calorie malnutrition, severe (HCC) 02/04/2015  . Unstable gait 08/15/2013  . Depression 08/15/2013  . Osteoporosis 07/18/2011  . Meniere's disease 07/18/2011    Palliative Care Assessment & Plan   HPI: 84 y.o.femalewith past medical history of fibromyalgia, incarcerated hiatal hernia with obstruction in 2016, Meniere's, HTN, GERD, aspiration pneumonia, arthritis, anxiety, gi bleeding, and malnutritionadmitted on 5/6/2019with aspiration pna and sepsis.Shewas recentlydiagnosed with a UTI andprescribedKeflex but wasunable to tolerate it. She was having N/Vin the ED and appears to have aspirated.  PMT consulted for GOC.  Assessment: F/u with patient. Nicole Key at bedside. They both report patient is doing better. Happy with disposition plan.  Patient tells me her pain in uncontrolled. Specifically c/o pain in left arm - tells me this is chronic. At home she uses aspercreme. She tells me the IV morphine is effective.   Recommendations/Plan: - Patient's goals are: to be at home and not be rehospitalized - Patient would like to return home with [facility] home health ("to get as strong as possible") then transition to hospice at home - patient/family advised to call PCP for  hospice referral when ready - fentanyl patch and IV morphine for pain per primary - patient takes norco at home -Continue xanax for anxiety - Scheduled tylenol and voltaren gel for chronic pain - DNR form completed and placed in chart  Goals of Care and Additional Recommendations:  Limitations on Scope of Treatment: Avoid Hospitalization  Code Status:  DNR  Prognosis:   Unable to determine poor prognosis r/t PPS 50%, albumin 2.4, poor baseline functional status, risk for aspiration pna  Discharge Planning:  Home with Home Health, then transition to hospice  Care plan was discussed with patient and grandson  Thank you for allowing the Palliative Medicine Team to assist in the care of this patient.   Total Time 30 minutes Prolonged Time Billed  no       Greater than 50%  of this time was spent counseling and coordinating care related to the above assessment and plan.  Gerlean Ren, DNP, AGNP-C Palliative Medicine Team Team Phone # [phone]

## 2018-01-05 DIAGNOSIS — K449 Diaphragmatic hernia without obstruction or gangrene: Secondary | ICD-10-CM

## 2018-01-05 DIAGNOSIS — K219 Gastro-esophageal reflux disease without esophagitis: Secondary | ICD-10-CM

## 2018-01-05 MED ORDER — OXYCODONE HCL 5 MG PO TABS
5.0000 mg | ORAL_TABLET | ORAL | Status: DC | PRN
  Administered 2018-01-07 – 2018-01-08 (×3): 10 mg via ORAL
  Filled 2018-01-05 (×3): qty 2

## 2018-01-05 MED ORDER — MORPHINE SULFATE (PF) 2 MG/ML IV SOLN
2.0000 mg | INTRAVENOUS | Status: DC | PRN
  Administered 2018-01-05 – 2018-01-06 (×5): 2 mg via INTRAVENOUS
  Administered 2018-01-07: 4 mg via INTRAVENOUS
  Administered 2018-01-07: 2 mg via INTRAVENOUS
  Administered 2018-01-07 – 2018-01-08 (×3): 4 mg via INTRAVENOUS
  Administered 2018-01-08 (×2): 2 mg via INTRAVENOUS
  Administered 2018-01-09 (×4): 4 mg via INTRAVENOUS
  Filled 2018-01-05: qty 1
  Filled 2018-01-05: qty 2
  Filled 2018-01-05 (×3): qty 1
  Filled 2018-01-05 (×3): qty 2
  Filled 2018-01-05: qty 1
  Filled 2018-01-05: qty 2
  Filled 2018-01-05: qty 1
  Filled 2018-01-05 (×3): qty 2
  Filled 2018-01-05 (×2): qty 1

## 2018-01-05 MED ORDER — ALPRAZOLAM 0.25 MG PO TABS
0.2500 mg | ORAL_TABLET | Freq: Three times a day (TID) | ORAL | Status: DC | PRN
  Administered 2018-01-05 – 2018-01-06 (×2): 0.25 mg via ORAL
  Filled 2018-01-05 (×2): qty 1

## 2018-01-05 NOTE — Progress Notes (Signed)
[facility] TEAM 1 - Stepdown/ICU Nicole Key  ZOX:[id number] DOB: 1928-01-28 DOA: 12/31/2017 PCP: Geoffry Paradise, MD    Brief Narrative:  84 y.o.femalew/ a hx of alarge hiatal hernia,incarcerated hiatal hernia with obstruction, aspiration pneumonia, HTN, fibromyalgia, anxiety, GERD, malnutrition, and gi bleeding who was admitted 12/31/17 with aspiration pneumonia and sepsis. Patient had been diagnosed with a UTI and put on Keflex but developed emesis.  While in the ED she suffered an apneic event associated with aspiration during a bout of emesis.    Subjective: The patient reports ongoing pain in her neck shoulders and knees.  This represents an exacerbation of her chronic pain.  She reports her breathing is somewhat improved.  She admits to a very poor appetite.  She remains on low-level oxygen support.  Assessment & Plan:  Sepsis due to aspiration pneumonia / pneumonitis  transitioned to oral abx to complete 7 days of tx - appears to be slowly improving   Acute hypoxic respiratory failure due to aspiration Stabilizing - wean off O2 as able - only requiring 1-2L today   Large HH - GERD - recurrent aspiration   PPI + carafate - sx control is our only option   Anemia Hg stable - no s/s of acute bleeding   Hypomagnesemia / Hypokalemia / Hypophosphatemia Due to poor nutritional state - corrected w/ supplementation   HTN BP controlled - goal is to avoid extremes - no indication for strict BP control   Fibromyalgia Adjust pain meds again toady due to acute exacerbation   Severe malnutrition in context of chronic illness - Hypoalbuminemia Likely to persist w/ pt having very poor appetite and little intake   DVT prophylaxis: SCDs Code Status: DNR - NO CODE Family Communication: spoke w/ family at bedside  Disposition Plan: home w/ [facility] w/ ultimate goal for hospice care and no further hospitalization -hope to see the patient resting more comfortably/overall more  stable before discharge home -?  48 hours  Consultants:  Palliative Care   Antimicrobials:  Zosyn 5/7 > 5/10 Vancomycin 5/7 > 5/10 Augmentin 5/10 >  Objective: Blood pressure 131/62, pulse (!) 114, temperature 98.9 F (37.2 C), temperature source Oral, resp. rate 20, height  (1.702 m), weight 52 kg (114 lb 10.2 oz), SpO2 93 %.  Intake/Output Summary (Last 24 hours) at 01/05/2018 1409 Last data filed at 01/05/2018 0845 Gross per 24 hour  Intake 203 ml  Output 500 ml  Net -297 ml   Filed Weights   01/02/18 0500 01/03/18 0423 01/04/18 0500  Weight: 47.5 kg (104 lb 11.5 oz) 48.9 kg (107 lb 12.9 oz) 52 kg (114 lb 10.2 oz)    Examination: General: No acute respiratory distress Lungs: Bibasilar crackles w/ course bs th/o all fields - no wheezing  Cardiovascular: Regular rate and rhythm   Abdomen: Nontender, thin, soft, bowel sounds positive, no rebound Extremities: No edema B LE   CBC: Recent Labs  Lab 01/01/18 0746 01/02/18 0228 01/03/18 0204 01/04/18 0325  WBC 24.4* 20.1* 21.8* 18.4*  NEUTROABS 20.9*  --   --   --   HGB 13.5 9.9* 11.5* 9.9*  HCT 40.7 31.1* 35.3* 31.1*  MCV 87.2 88.1 89.4 87.6  PLT 383 236 240 281   Basic Metabolic Panel: Recent Labs  Lab 12/31/17 2216 01/01/18 1055 01/02/18 0228  NA 138  --  142  K 3.1*  --  3.4*  CL 95*  --  108  CO2 29  --  28  GLUCOSE 120*  --  110*  BUN 17  --  17  CREATININE 0.71  --  0.66  CALCIUM 9.3  --  8.0*  MG  --  1.5* 2.0  PHOS  --  3.0  --    GFR: Estimated Creatinine Clearance: 40.7 mL/min (by C-G formula based on SCr of 0.66 mg/dL).  Liver Function Tests: Recent Labs  Lab 12/31/17 2216 01/02/18 0228  AST 15 10*  ALT 10* 8*  ALKPHOS 46 34*  BILITOT 0.7 0.6  PROT 7.8 5.3*  ALBUMIN 3.5 2.4*   Recent Labs  Lab 12/31/17 2216  LIPASE 21   Coagulation Profile: Recent Labs  Lab 01/01/18 1055  INR 1.13    Recent Results (from the past 240 hour(s))  Culture, blood (routine x 2)      Status: None (Preliminary result)   Collection Time: 01/01/18  6:58 AM  Result Value Ref Range Status   Specimen Description BLOOD RIGHT ANTECUBITAL  Final   Special Requests   Final    BOTTLES DRAWN AEROBIC AND ANAEROBIC Blood Culture adequate volume   Culture   Final    NO GROWTH 4 DAYS Performed at [facility] Lab, [address], [place], [place] [address]    Report Status PENDING  Incomplete  Culture, blood (routine x 2)     Status: None (Preliminary result)   Collection Time: 01/01/18  7:03 AM  Result Value Ref Range Status   Specimen Description BLOOD LEFT ANTECUBITAL  Final   Special Requests   Final    BOTTLES DRAWN AEROBIC ONLY Blood Culture results may not be optimal due to an inadequate volume of blood received in culture bottles   Culture   Final    NO GROWTH 4 DAYS Performed at [facility] Lab, [address] [place], [place] [address]    Report Status PENDING  Incomplete  MRSA PCR Screening     Status: None   Collection Time: 01/01/18  3:31 PM  Result Value Ref Range Status   MRSA by PCR NEGATIVE NEGATIVE Final    Comment:        The GeneXpert MRSA Assay (FDA approved for NASAL specimens only), is one component of a comprehensive MRSA colonization surveillance program. It is not intended to diagnose MRSA infection nor to guide or monitor treatment for MRSA infections. Performed at [facility], [address] [place], [place] [address]   Culture, Urine     Status: None   Collection Time: 01/01/18  5:07 PM  Result Value Ref Range Status   Specimen Description URINE, CATHETERIZED  Final   Special Requests NONE  Final   Culture   Final    NO GROWTH Performed at [facility], [address], [place], [place] [address]    Report Status 01/02/2018 FINAL  Final     Scheduled Meds: . amoxicillin-clavulanate  1 tablet Oral BID  . diclofenac sodium  2 g Topical QID  . feeding supplement  1 Container Oral TID BM  . fentaNYL  25 mcg  Transdermal Q72H  . pantoprazole  40 mg Oral BID AC  . sodium chloride flush  3 mL Intravenous Q12H  . sucralfate  1 g Oral TID AC & HS     LOS: 4 days   Lonia Blood, MD [facility] Office  [phone] Pager - Text Page per Amion as per below:  On-Call/Text Page:      [email or web address]  password [email or web address]  If 7PM-7AM, please contact night-coverage [email or web address] Password [email or web address] 01/05/2018, 2:09 PM

## 2018-01-06 LAB — CULTURE, BLOOD (ROUTINE X 2)
Culture: NO GROWTH
Culture: NO GROWTH
Special Requests: ADEQUATE

## 2018-01-06 MED ORDER — ISOSORBIDE MONONITRATE ER 30 MG PO TB24
15.0000 mg | ORAL_TABLET | Freq: Every day | ORAL | Status: DC
  Administered 2018-01-06 – 2018-01-09 (×4): 15 mg via ORAL
  Filled 2018-01-06 (×4): qty 1

## 2018-01-06 MED ORDER — ALPRAZOLAM 0.25 MG PO TABS
0.2500 mg | ORAL_TABLET | Freq: Three times a day (TID) | ORAL | Status: DC | PRN
  Administered 2018-01-07 – 2018-01-08 (×3): 0.5 mg via ORAL
  Filled 2018-01-06 (×3): qty 2

## 2018-01-06 NOTE — Progress Notes (Signed)
Nicole Key TEAM 1 - Stepdown/ICU VERENICE WESTRICH  ONG:[id number] DOB: Jan 26, 1928 DOA: 12/31/2017 PCP: Geoffry Paradise, MD    Brief Narrative:  84 y.o.femalew/ a hx of alarge hiatal hernia,incarcerated hiatal hernia with obstruction, aspiration pneumonia, HTN, fibromyalgia, anxiety, GERD, malnutrition, and gi bleeding who was admitted 12/31/17 with aspiration pneumonia and sepsis. Patient had been diagnosed with a UTI and put on Keflex but developed emesis.  While in the ED she suffered an apneic event associated with aspiration during a bout of emesis.    Subjective: The patient complains of a generalized heaviness in her chest and worsening shortness of breath associated with this this morning.  She denies nausea vomiting or abdominal pain.  She reports very limited oral intake.  Assessment & Plan:  Sepsis due to aspiration pneumonia / pneumonitis  transitioned to oral abx - to complete 7 days of tx - appears to be stalling in regard to her improvement - it is not clear to me that she will be able to be kept comfortable at home - adjust tx regimen today and f/u in AM   Acute hypoxic respiratory failure due to aspiration Cont in attempts to wean off O2 as able - only requiring 1-2L today   Large HH - GERD - recurrent aspiration   PPI + carafate - GI sx appear to be signif improved   Anemia Hg stable - no s/s of acute bleeding   Hypomagnesemia / Hypokalemia / Hypophosphatemia Due to poor nutritional state - corrected w/ supplementation - do not plan to draw more labs during this hospital stay   HTN BP goal is to avoid extremes - no indication for strict BP control   Fibromyalgia Pain appears better controlled at this time   Severe malnutrition in context of chronic illness - Hypoalbuminemia Likely to persist w/ pt having very poor appetite and little intake   DVT prophylaxis: SCDs Code Status: DNR - NO CODE Family Communication: no family present at time of exam  today   Disposition Plan: home w/ [facility] w/ ultimate goal for hospice care and no further hospitalization - hope to see the patient resting more comfortably/overall more stable before discharge home -?  48 hours  Consultants:  Palliative Care   Antimicrobials:  Zosyn 5/7 > 5/10 Vancomycin 5/7 > 5/10 Augmentin 5/10 > 5/13  Objective: Blood pressure (!) 153/75, pulse 98, temperature 98.3 F (36.8 C), temperature source Oral, resp. rate (!) 24, height  (1.702 m), weight 52 kg (114 lb 10.2 oz), SpO2 96 %.  Intake/Output Summary (Last 24 hours) at 01/06/2018 1138 Last data filed at 01/06/2018 0937 Gross per 24 hour  Intake 3 ml  Output 600 ml  Net -597 ml   Filed Weights   01/02/18 0500 01/03/18 0423 01/04/18 0500  Weight: 47.5 kg (104 lb 11.5 oz) 48.9 kg (107 lb 12.9 oz) 52 kg (114 lb 10.2 oz)    Examination: General: No acute respiratory distress - alert  Lungs: Bibasilar crackles w/o change - no wheezing  Cardiovascular: Regular rate and rhythm w/o M  Abdomen: Nontender, thin, soft, bowel sounds positive, no rebound Extremities: no signif edema B LE   CBC: Recent Labs  Lab 01/01/18 0746 01/02/18 0228 01/03/18 0204 01/04/18 0325  WBC 24.4* 20.1* 21.8* 18.4*  NEUTROABS 20.9*  --   --   --   HGB 13.5 9.9* 11.5* 9.9*  HCT 40.7 31.1* 35.3* 31.1*  MCV 87.2 88.1 89.4 87.6  PLT 383 236 240 281  Basic Metabolic Panel: Recent Labs  Lab 12/31/17 2216 01/01/18 1055 01/02/18 0228  NA 138  --  142  K 3.1*  --  3.4*  CL 95*  --  108  CO2 29  --  28  GLUCOSE 120*  --  110*  BUN 17  --  17  CREATININE 0.71  --  0.66  CALCIUM 9.3  --  8.0*  MG  --  1.5* 2.0  PHOS  --  3.0  --    GFR: Estimated Creatinine Clearance: 40.7 mL/min (by C-G formula based on SCr of 0.66 mg/dL).  Liver Function Tests: Recent Labs  Lab 12/31/17 2216 01/02/18 0228  AST 15 10*  ALT 10* 8*  ALKPHOS 46 34*  BILITOT 0.7 0.6  PROT 7.8 5.3*  ALBUMIN 3.5 2.4*   Recent Labs  Lab  12/31/17 2216  LIPASE 21   Coagulation Profile: Recent Labs  Lab 01/01/18 1055  INR 1.13    Recent Results (from the past 240 hour(s))  Culture, blood (routine x 2)     Status: None   Collection Time: 01/01/18  6:58 AM  Result Value Ref Range Status   Specimen Description BLOOD RIGHT ANTECUBITAL  Final   Special Requests   Final    BOTTLES DRAWN AEROBIC AND ANAEROBIC Blood Culture adequate volume   Culture   Final    NO GROWTH 5 DAYS Performed at [facility], [address] [place], [place] [address]    Report Status 01/06/2018 FINAL  Final  Culture, blood (routine x 2)     Status: None   Collection Time: 01/01/18  7:03 AM  Result Value Ref Range Status   Specimen Description BLOOD LEFT ANTECUBITAL  Final   Special Requests   Final    BOTTLES DRAWN AEROBIC ONLY Blood Culture results may not be optimal due to an inadequate volume of blood received in culture bottles   Culture   Final    NO GROWTH 5 DAYS Performed at [facility], [address], [place], [place] [address]    Report Status 01/06/2018 FINAL  Final  MRSA PCR Screening     Status: None   Collection Time: 01/01/18  3:31 PM  Result Value Ref Range Status   MRSA by PCR NEGATIVE NEGATIVE Final    Comment:        The GeneXpert MRSA Assay (FDA approved for NASAL specimens only), is one component of a comprehensive MRSA colonization surveillance program. It is not intended to diagnose MRSA infection nor to guide or monitor treatment for MRSA infections. Performed at [facility], [address], [place], [place] [address]   Culture, Urine     Status: None   Collection Time: 01/01/18  5:07 PM  Result Value Ref Range Status   Specimen Description URINE, CATHETERIZED  Final   Special Requests NONE  Final   Culture   Final    NO GROWTH Performed at [facility], [address] [place], [place] [address]    Report Status 01/02/2018 FINAL  Final     Scheduled Meds: .  amoxicillin-clavulanate  1 tablet Oral BID  . diclofenac sodium  2 g Topical QID  . feeding supplement  1 Container Oral TID BM  . fentaNYL  25 mcg Transdermal Q72H  . pantoprazole  40 mg Oral BID AC  . sodium chloride flush  3 mL Intravenous Q12H  . sucralfate  1 g Oral TID AC & HS  LOS: 5 days   Lonia Blood, MD [facility] Office  [phone] Pager - Text Page per Amion as per below:  On-Call/Text Page:      [email or web address]      password [email or web address]  If 7PM-7AM, please contact night-coverage [email or web address] Password [facility] 01/06/2018, 11:38 AM

## 2018-01-07 NOTE — Progress Notes (Signed)
Attempted to see pt today. Pt declined stating she needs to rest and nobody understands how she feels.  She said she cannot go home yet because she is so weak.  Therapist explained the only way to get stronger and go home is to do some activity/therapy but pt continued to adamantly decline.  Will attempt back as schedule allows. Tory Emerald, [place] [phone]

## 2018-01-07 NOTE — Progress Notes (Signed)
SLP Cancellation Note  Patient Details Name: Nicole Key MRN: [id number] DOB: 09/17/1927   Cancelled treatment:       Reason Eval/Treat Not Completed: Other (comment) Pt declines PO trials at this time and declines further SLP intervention. She has a ore chronic dysphagia and knows the safe swallow precautions that she should be using, and she does not believe she needs any further diet modifications at this time. SLP will sign off - please reorder as needed with any acute changes or concerns.   Maxcine Ham 01/07/2018, 9:28 AM  Maxcine Ham, M.A. CCC-SLP [phone]

## 2018-01-07 NOTE — Progress Notes (Signed)
[place] TEAM 1 - Stepdown/ICU TERRYL NIZIOLEK  LKG:[id number] DOB: 1927/12/23 DOA: 12/31/2017 PCP: Geoffry Paradise, MD    Brief Narrative:  84 y.o.femalew/ a hx of alarge hiatal hernia,incarcerated hiatal hernia with obstruction, aspiration pneumonia, HTN, fibromyalgia, anxiety, GERD, malnutrition, and gi bleeding who was admitted 12/31/17 with aspiration pneumonia and sepsis. Patient had been diagnosed with a UTI and put on Keflex but developed emesis.  While in the ED she suffered an apneic event associated with aspiration during a bout of emesis.    Subjective: The patient is somewhat more lethargic today.  She tells me she is still quite anxious and continues to have some shortness of breath.  She feels that overall she is slightly improved.  She denies current nausea or vomiting.  She is revealed that her oral intake is improving.  She tells me she is not quite ready for discharge home but thinks she might be ready by tomorrow.  Assessment & Plan:  Sepsis due to aspiration pneumonia / pneumonitis  transitioned to oral abx - to complete 7 days of tx today - appears to be stalling in regard to her improvement - it is not clear to me that she will be able to be kept comfortable at home - may need to consider residential hospice option, though presently she tells me she still desires d/c home   Acute hypoxic respiratory failure due to aspiration O2 support at this time if primarily a comfort issue  Large HH - GERD - recurrent aspiration   PPI + carafate - GI sx appear to be stable - patient is not a candidate for definitive treatment of this issue and therefore symptom management alone is our goal  Anemia Hg stable - no s/s of acute bleeding   Hypomagnesemia / Hypokalemia / Hypophosphatemia Due to poor nutritional state - corrected w/ supplementation - do not plan to draw more labs during this hospital stay   HTN BP goal is to avoid extremes - no indication for strict BP  control - no change in tx   Fibromyalgia Pain appears better controlled at this time   Severe malnutrition in context of chronic illness - Hypoalbuminemia Likely to persist w/ pt having very poor appetite and little intake   Anxiety Pt c/o ongoing poorly controlled anxiety - gently adjust tx regimen and follow for results   DVT prophylaxis: SCDs Code Status: DNR - NO CODE Family Communication: no family present at time of exam today   Disposition Plan: home w/ [facility] w/ ultimate goal for hospice care and no further hospitalization - hope to see the patient resting more comfortably/overall more stable before discharge home - anxiety appears to be a major barrier to her comfort presently  Consultants:   Palliative Care   Antimicrobials:  Zosyn 5/7 > 5/10 Vancomycin 5/7 > 5/10 Augmentin 5/10 > 5/13  Objective: Blood pressure (!) 151/73, pulse 89, temperature 98.3 F (36.8 C), temperature source Oral, resp. rate (!) 32, height  (1.702 m), weight 52 kg (114 lb 10.2 oz), SpO2 97 %.  Intake/Output Summary (Last 24 hours) at 01/07/2018 1109 Last data filed at 01/07/2018 0950 Gross per 24 hour  Intake 483 ml  Output 1200 ml  Net -717 ml   Filed Weights   01/02/18 0500 01/03/18 0423 01/04/18 0500  Weight: 47.5 kg (104 lb 11.5 oz) 48.9 kg (107 lb 12.9 oz) 52 kg (114 lb 10.2 oz)    Examination: General: No acute respiratory distress - anxious  Lungs: Bibasilar crackles w/o change  Cardiovascular: RRR Abdomen: Nontender, thin, soft, bowel sounds positive Extremities: no edema B LE   CBC: Recent Labs  Lab 01/01/18 0746 01/02/18 0228 01/03/18 0204 01/04/18 0325  WBC 24.4* 20.1* 21.8* 18.4*  NEUTROABS 20.9*  --   --   --   HGB 13.5 9.9* 11.5* 9.9*  HCT 40.7 31.1* 35.3* 31.1*  MCV 87.2 88.1 89.4 87.6  PLT 383 236 240 281   Basic Metabolic Panel: Recent Labs  Lab 12/31/17 2216 01/01/18 1055 01/02/18 0228  NA 138  --  142  K 3.1*  --  3.4*  CL 95*  --  108  CO2 29   --  28  GLUCOSE 120*  --  110*  BUN 17  --  17  CREATININE 0.71  --  0.66  CALCIUM 9.3  --  8.0*  MG  --  1.5* 2.0  PHOS  --  3.0  --    GFR: Estimated Creatinine Clearance: 40.7 mL/min (by C-G formula based on SCr of 0.66 mg/dL).  Liver Function Tests: Recent Labs  Lab 12/31/17 2216 01/02/18 0228  AST 15 10*  ALT 10* 8*  ALKPHOS 46 34*  BILITOT 0.7 0.6  PROT 7.8 5.3*  ALBUMIN 3.5 2.4*   Recent Labs  Lab 12/31/17 2216  LIPASE 21   Coagulation Profile: Recent Labs  Lab 01/01/18 1055  INR 1.13    Recent Results (from the past 240 hour(s))  Culture, blood (routine x 2)     Status: None   Collection Time: 01/01/18  6:58 AM  Result Value Ref Range Status   Specimen Description BLOOD RIGHT ANTECUBITAL  Final   Special Requests   Final    BOTTLES DRAWN AEROBIC AND ANAEROBIC Blood Culture adequate volume   Culture   Final    NO GROWTH 5 DAYS Performed at [facility], [address], [place], [place] [address]    Report Status 01/06/2018 FINAL  Final  Culture, blood (routine x 2)     Status: None   Collection Time: 01/01/18  7:03 AM  Result Value Ref Range Status   Specimen Description BLOOD LEFT ANTECUBITAL  Final   Special Requests   Final    BOTTLES DRAWN AEROBIC ONLY Blood Culture results may not be optimal due to an inadequate volume of blood received in culture bottles   Culture   Final    NO GROWTH 5 DAYS Performed at [facility], [address], [place], [place] [address]    Report Status 01/06/2018 FINAL  Final  MRSA PCR Screening     Status: None   Collection Time: 01/01/18  3:31 PM  Result Value Ref Range Status   MRSA by PCR NEGATIVE NEGATIVE Final    Comment:        The GeneXpert MRSA Assay (FDA approved for NASAL specimens only), is one component of a comprehensive MRSA colonization surveillance program. It is not intended to diagnose MRSA infection nor to guide or monitor treatment for MRSA infections. Performed at [facility], [address], [place], [place] [address]   Culture, Urine     Status: None   Collection Time: 01/01/18  5:07 PM  Result Value Ref Range Status   Specimen Description URINE, CATHETERIZED  Final   Special Requests NONE  Final   Culture   Final    NO GROWTH Performed at [facility], [address] [place], [place] [address]  Report Status 01/02/2018 FINAL  Final     Scheduled Meds: . amoxicillin-clavulanate  1 tablet Oral BID  . diclofenac sodium  2 g Topical QID  . feeding supplement  1 Container Oral TID BM  . fentaNYL  25 mcg Transdermal Q72H  . isosorbide mononitrate  15 mg Oral Daily  . pantoprazole  40 mg Oral BID AC  . sodium chloride flush  3 mL Intravenous Q12H  . sucralfate  1 g Oral TID AC & HS     LOS: 6 days   Lonia Blood, MD [facility] Office  [phone] Pager - Text Page per Amion as per below:  On-Call/Text Page:      [email or web address]      password [email or web address]  If 7PM-7AM, please contact night-coverage [email or web address] Password [email or web address] 01/07/2018, 11:09 AM

## 2018-01-07 NOTE — Care Management Important Message (Signed)
Important Message  Patient Details  Name: Nicole Key MRN: [id number] Date of Birth: 03-10-1928   Medicare Important Message Given:  Yes    Brytni Dray Stefan Church 01/07/2018, 3:39 PM

## 2018-01-08 NOTE — Progress Notes (Signed)
Occupational Therapy Treatment Patient Details Name: Nicole Key MRN: [id number] DOB: [birth year] Today's Date: 01/08/2018    History of present illness 84 y.o. female with medical history significant for severe hiatal hernia with GERD and chronic reflux with chronic cough, protein calorie malnutrition, arthritis, fibromyalgia. Presents from home with 4 days of nausea, vomiting, poor intake preceded by dysuria and lower abdominal pain without definitive source identified.  Attempted outpatient Keflex but unable to tolerate secondary to nausea and vomiting.  Since arrival patient currently experienced an aspiration event and now appears to have acute aspiration pneumonitis   OT comments  Pt agreeable to move to chair. Pt stating" I know what I can do and what I can't do". Anticipate DC home this week. Encouraged pt to get OOB daily with staff. Niece present for session. All further OT to be addressed by [facility] as appropriate. OT signing off.   Follow Up Recommendations  Supervision/Assistance - 24 hour;Home health OT    Equipment Recommendations  None recommended by OT    Recommendations for Other Services      Precautions / Restrictions Precautions Precautions: Fall       Mobility Bed Mobility Overal bed mobility: Needs Assistance Bed Mobility: Supine to Sit     Supine to sit: HOB elevated;Supervision        Transfers Overall transfer level: Needs assistance   Transfers: Squat Pivot Transfers Sit to Stand: Mod assist         General transfer comment: Pt reaching for recliner handle adn mobilizing self. Mod A to lift adn lower    Balance     Sitting balance-Leahy Scale: Fair       Standing balance-Leahy Scale: Poor                             ADL either performed or assessed with clinical judgement   ADL                                       Functional mobility during ADLs: Moderate assistance General ADL Comments: Pt wet but  declined this therapist to help clean her. Family present adn verbalized understanding need tfor pt to be cleaned     Vision       Perception     Praxis      Cognition Arousal/Alertness: Awake/alert Behavior During Therapy: Agitated Overall Cognitive Status: Impaired/Different from baseline Area of Impairment: Safety/judgement                         Safety/Judgement: Decreased awareness of safety;Decreased awareness of deficits              Exercises     Shoulder Instructions       General Comments      Pertinent Vitals/ Pain       Pain Assessment: Faces Faces Pain Scale: Hurts little more Pain Location: back Pain Descriptors / Indicators: Sore Pain Intervention(s): Limited activity within patient's tolerance  Home Living                                          Prior Functioning/Environment              Frequency  Progress Toward Goals  OT Goals(current goals can now be found in the care plan section)  Progress towards OT goals: Not progressing toward goals - comment  Acute Rehab OT Goals Patient Stated Goal: to go home OT Goal Formulation: With patient Time For Goal Achievement: 01/17/18 Potential to Achieve Goals: Fair ADL Goals Pt Will Perform Grooming: with supervision;with set-up;sitting Pt Will Perform Upper Body Bathing: with supervision;sitting;with set-up Pt Will Perform Lower Body Bathing: with supervision;sit to/from stand Pt Will Perform Upper Body Dressing: with set-up;with supervision;sitting Pt Will Perform Lower Body Dressing: with supervision;sit to/from stand;with set-up Pt Will Transfer to Toilet: with supervision;ambulating;bedside commode Pt Will Perform Toileting - Clothing Manipulation and hygiene: with supervision;sit to/from stand  Plan Discharge plan needs to be updated    Co-evaluation                 AM-PAC PT "6 Clicks" Daily Activity     Outcome Measure   Help  from another person eating meals?: None Help from another person taking care of personal grooming?: A Lot Help from another person toileting, which includes using toliet, bedpan, or urinal?: A Lot Help from another person bathing (including washing, rinsing, drying)?: A Lot Help from another person to put on and taking off regular upper body clothing?: A Lot Help from another person to put on and taking off regular lower body clothing?: A Lot 6 Click Score: 14    End of Session Equipment Utilized During Treatment: Oxygen  OT Visit Diagnosis: Unsteadiness on feet (R26.81);Pain;Muscle weakness (generalized) (M62.81) Pain - part of body: (back)   Activity Tolerance Patient limited by fatigue   Patient Left in chair;with call bell/phone within reach;with family/visitor present   Nurse Communication Mobility status        Time: 1610-9604 OT Time Calculation (min): 11 min  Charges: OT General Charges $OT Visit: 1 Visit OT Treatments $Self Care/Home Management : 8-22 mins  Luisa Dago, OT/L  OT Clinical Specialist [phone]    [facility] 01/08/2018, 1:59 PM

## 2018-01-08 NOTE — Progress Notes (Signed)
PT Cancellation Note  Patient Details Name: Nicole Key MRN: [id number] DOB: 1928/02/02   Cancelled Treatment:    Reason Eval/Treat Not Completed: (P) Other (comment)(Pt to d/c home with hospice care. ) PT signing off.   Berline Semrad B. Beverely Risen PT, DPT Acute Rehabilitation  [phone] Pager [phone]   Elon Alas Fleet 01/08/2018, 3:35 PM

## 2018-01-08 NOTE — Progress Notes (Signed)
[facility] Liaison: RN visit  Notified by Nicole Key, [facility] of patient/family request for [facility] services at home after discharge. Chart and patient information reviewed and approved by Dr. Jamie Brookes.   Writer spoke with patient and niece, Nicole Key at bedside to initiate education related to hospice philosophy, services and team approach to care. Patient and Nicole Key verbalized understanding of information given. Per discussion, plan is for discharge to home by private vehicle tomorrow.  Please send signed and completed DNR form home with patient/family. Patient will need prescriptions for discharge comfort medications.  DME needs have been discussed, patient currently has the following equipment in the home: [place], [place].  Patient/family requests the following DME for delivery to the home: none.          [facility] aware of the above. Please notify [facility] when patient is ready to leave the unit at discharge. (Call [phone] or [phone] after 5pm.) [facility] information and contact numbers given to niece, Nicole Key at time of visit. Above information shared with Nicole Key, CMRN.  Please call with any hospice related questions.  Thank you for this referral.  Nicole Saas, RN, [facility] Liaison [phone] ? [facility] liaisons are now on AMION.

## 2018-01-08 NOTE — Progress Notes (Signed)
[facility] TEAM 1 - Stepdown/ICU LOGHAN SUBIA  ZOX:[id number] DOB: 11/29/1927 DOA: 12/31/2017 PCP: Geoffry Paradise, MD    Brief Narrative:  84 y.o.femalew/ a hx of alarge hiatal hernia,incarcerated hiatal hernia with obstruction, aspiration pneumonia, HTN, fibromyalgia, anxiety, GERD, malnutrition, and gi bleeding who was admitted 12/31/17 with aspiration pneumonia and sepsis. Patient had been diagnosed with a UTI and put on Keflex but developed emesis.  While in the ED she suffered an apneic event associated with aspiration during a bout of emesis.    Subjective: The patient is alert and conversant.  She reports ongoing chronic pain in multiple sites but feels that it is reasonably controlled.  She understands that we will not be able to completely eliminate her pain without heavily sedating her and she does not wish to be sedated at all times.  She denies significant shortness of breath when at rest today.  I have had a lengthy discussion with the patient and her caregiver at the bedside.  I have suggested that the best venue for discharge might be an inpatient hospice facility such as [facility].  The patient is absolutely focused on discharge home.  She does except the fact that she will need significant help at home and has agreed to allow [place] to arrange for home hospice to the maximum extent possible prior to her discharge.  We will tentatively plan for her to be discharged 01/09/18 if all arrangements can be made for home care by that time.  Assessment & Plan:  Sepsis due to aspiration pneumonia / pneumonitis  Completed 7 days of antibiotic therapy - now on room air only - it is not clear to me that she will be able to be kept comfortable at home but she insisted that discharge to home with the only disposition she will entertain  Acute hypoxic respiratory failure due to aspiration Resolved with patient now on room air  Large HH - GERD - recurrent aspiration   PPI + carafate  - GI sx appear to be stable - patient is not a candidate for definitive treatment of this issue and therefore symptom management alone is our goal  Anemia Hg stable - no s/s of acute bleeding   Hypomagnesemia / Hypokalemia / Hypophosphatemia Due to poor nutritional state - corrected w/ supplementation - do not plan to draw more labs during this hospital stay   HTN BP goal is to avoid extremes - no indication for strict BP control - no change in tx   Fibromyalgia  Pain appears to likely be as controlled as it can be without heavily sedating the patient  Severe malnutrition in context of chronic illness - Hypoalbuminemia Likely to persist w/ pt having very poor appetite and little intake   Anxiety Anxiety appears to be reasonably well controlled at this time but the patient will likely require further titration of her anxiolytics overtime   DVT prophylaxis: SCDs Code Status: DNR - NO CODE Family Communication: Spoke with her caregiver (her niece) at bedside Disposition Plan: home w/ [facility] w/ ultimate goal for hospice care and no further hospitalization - hope to see the patient resting more comfortably/overall more stable before discharge home - anxiety appears to be a major barrier to her comfort presently  Consultants:   Palliative Care   Antimicrobials:  Zosyn 5/7 > 5/10 Vancomycin 5/7 > 5/10 Augmentin 5/10 > 5/13  Objective: Blood pressure (!) 141/63, pulse 85, temperature 98 F (36.7 C), temperature source Oral, resp. rate Marland Kitchen)  23, height  (1.702 m), weight 52 kg (114 lb 10.2 oz), SpO2 92 %.  Intake/Output Summary (Last 24 hours) at 01/08/2018 1324 Last data filed at 01/08/2018 0900 Gross per 24 hour  Intake 810 ml  Output 850 ml  Net -40 ml   Filed Weights   01/02/18 0500 01/03/18 0423 01/04/18 0500  Weight: 47.5 kg (104 lb 11.5 oz) 48.9 kg (107 lb 12.9 oz) 52 kg (114 lb 10.2 oz)    Examination: General: No acute respiratory distress - alert and  conversant Lungs: Bibasilar crackles - no wheezing  Cardiovascular: RRR - no M or rub  Abdomen: Nontender, thin, soft, bowel sounds positive Extremities: no edema B LE - no cyanosis   CBC: Recent Labs  Lab 01/02/18 0228 01/03/18 0204 01/04/18 0325  WBC 20.1* 21.8* 18.4*  HGB 9.9* 11.5* 9.9*  HCT 31.1* 35.3* 31.1*  MCV 88.1 89.4 87.6  PLT 236 240 281   Basic Metabolic Panel: Recent Labs  Lab 01/02/18 0228  NA 142  K 3.4*  CL 108  CO2 28  GLUCOSE 110*  BUN 17  CREATININE 0.66  CALCIUM 8.0*  MG 2.0   GFR: Estimated Creatinine Clearance: 40.7 mL/min (by C-G formula based on SCr of 0.66 mg/dL).  Liver Function Tests: Recent Labs  Lab 01/02/18 0228  AST 10*  ALT 8*  ALKPHOS 34*  BILITOT 0.6  PROT 5.3*  ALBUMIN 2.4*     Recent Results (from the past 240 hour(s))  Culture, blood (routine x 2)     Status: None   Collection Time: 01/01/18  6:58 AM  Result Value Ref Range Status   Specimen Description BLOOD RIGHT ANTECUBITAL  Final   Special Requests   Final    BOTTLES DRAWN AEROBIC AND ANAEROBIC Blood Culture adequate volume   Culture   Final    NO GROWTH 5 DAYS Performed at [facility], [address] [place], [place] [address]    Report Status 01/06/2018 FINAL  Final  Culture, blood (routine x 2)     Status: None   Collection Time: 01/01/18  7:03 AM  Result Value Ref Range Status   Specimen Description BLOOD LEFT ANTECUBITAL  Final   Special Requests   Final    BOTTLES DRAWN AEROBIC ONLY Blood Culture results may not be optimal due to an inadequate volume of blood received in culture bottles   Culture   Final    NO GROWTH 5 DAYS Performed at [facility], [address], [place], [place] [address]    Report Status 01/06/2018 FINAL  Final  MRSA PCR Screening     Status: None   Collection Time: 01/01/18  3:31 PM  Result Value Ref Range Status   MRSA by PCR NEGATIVE NEGATIVE Final    Comment:        The GeneXpert MRSA Assay (FDA approved  for NASAL specimens only), is one component of a comprehensive MRSA colonization surveillance program. It is not intended to diagnose MRSA infection nor to guide or monitor treatment for MRSA infections. Performed at [facility], [address] [place] [address] [place], [place] [address]   Culture, Urine     Status: None   Collection Time: 01/01/18  5:07 PM  Result Value Ref Range Status   Specimen Description URINE, CATHETERIZED  Final   Special Requests NONE  Final   Culture   Final    NO GROWTH Performed at [facility], [address]  [place], [place] [address]    Report Status 01/02/2018 FINAL  Final     Scheduled Meds: . diclofenac sodium  2 g Topical QID  . feeding supplement  1 Container Oral TID BM  . fentaNYL  25 mcg Transdermal Q72H  . isosorbide mononitrate  15 mg Oral Daily  . pantoprazole  40 mg Oral BID AC  . sodium chloride flush  3 mL Intravenous Q12H  . sucralfate  1 g Oral TID AC & HS     LOS: 7 days   Lonia Blood, MD [facility] Office  [phone] Pager - Text Page per Amion as per below:  On-Call/Text Page:      [email or web address]      password [email or web address]  If 7PM-7AM, please contact night-coverage [email or web address] Password [email or web address] 01/08/2018, 1:24 PM

## 2018-01-08 NOTE — Care Management Note (Signed)
Case Management Note  Patient Details  Name: Nicole Key MRN: [id number] Date of Birth: 09/17/1927  Subjective/Objective:   From home, presents with sepsis, asp pna, oniv abx, palliative consulted, she is active with [facility].  Palliative consulted.   5/13 1131- Letha Cape RN, BSN - more lethargic today, plan to dc home with [facility] services with [facility] and transition to home with hospice at later date.  Plan for dc tomorrow.  Patient can transport by car  5/14 1041 Letha Cape RN, BSN- orders for [facility], PT, OT, aide.  Plan for dc today.                  Action/Plan: DC home with [facility] services.  Expected Discharge Date:                  Expected Discharge Plan:  Home w Home Health Services  In-House Referral:     Discharge planning Services  CM Consult  Post Acute Care Choice:  Resumption of Svcs/PTA Provider Choice offered to:     DME Arranged:    DME Agency:     HH Arranged:  RN, PT, OT, Nurse's Aide HH Agency:  [facility]  Status of Service:  Completed, signed off  If discussed at [employer], dates discussed:    Additional Comments:  Leone Haven, RN 01/08/2018, 10:41 AM

## 2018-01-08 NOTE — Care Management Note (Addendum)
Case Management Note  Patient Details  Name: Nicole Key MRN: [id number] Date of Birth: 1928-01-24  Subjective/Objective:  From home, presents with sepsis, asp pna, oniv abx, palliative consulted, she is active with [facility] for [facility]. Palliative consulted.   5/13 1131- Letha Cape RN, BSN -more lethargic today,plan to dc home with [facility] services with [facility] and transition to home with hospice at later date. Plan for dc tomorrow. Patient can transport by car  5/14 1440 Letha Cape RN, BSN- Per MD , patient has decided she wants to go home with hospice.  NCM received referral for home hospice choice.  NCM offered choice to patient with niece at bedside, patient chose [facility].  She has BSC and rolling walker at home.  Referral made to [facility] with [facility] .                                    Action/Plan: DC home 5/15 with home hospice with [facility].  Expected Discharge Date:                  Expected Discharge Plan:  Home w Hospice Care  In-House Referral:     Discharge planning Services  CM Consult  Post Acute Care Choice:  Hospice Choice offered to:  Patient  DME Arranged:    DME Agency:     HH Arranged:  RN HH Agency:  [facility]  Status of Service:  Completed, signed off  If discussed at [employer], dates discussed:    Additional Comments:  Leone Haven, RN 01/08/2018, 2:40 PM

## 2018-01-09 DIAGNOSIS — A419 Sepsis, unspecified organism: Secondary | ICD-10-CM

## 2018-01-09 DIAGNOSIS — F419 Anxiety disorder, unspecified: Secondary | ICD-10-CM

## 2018-01-09 DIAGNOSIS — J189 Pneumonia, unspecified organism: Secondary | ICD-10-CM

## 2018-01-09 MED ORDER — ISOSORBIDE MONONITRATE ER 30 MG PO TB24: 15.0000 mg | ORAL_TABLET | Freq: Every day | ORAL | 0 refills | Status: AC

## 2018-01-09 MED ORDER — OXYCODONE HCL 5 MG PO TABS: 5.0000 mg | ORAL_TABLET | ORAL | 0 refills | Status: AC | PRN

## 2018-01-09 MED ORDER — ALPRAZOLAM 0.25 MG PO TABS: 0.2500 mg | ORAL_TABLET | Freq: Three times a day (TID) | ORAL | 0 refills | Status: AC | PRN

## 2018-01-09 MED ORDER — FENTANYL 25 MCG/HR TD PT72: 25.0000 ug | MEDICATED_PATCH | TRANSDERMAL | 0 refills | Status: AC

## 2018-01-09 MED ORDER — SUCRALFATE 1 G PO TABS: 1.0000 g | ORAL_TABLET | Freq: Three times a day (TID) | ORAL | 0 refills | Status: AC

## 2018-01-09 MED ORDER — ACETAMINOPHEN 325 MG PO TABS: 350.0000 mg | ORAL_TABLET | ORAL | 0 refills | Status: AC | PRN

## 2018-01-09 NOTE — Progress Notes (Signed)
Discharge instructions given to patient and niece who was at bedside. Hospice nurse notified of patients discharge. Pt taken down to private vehicle for transportation home by NT and RN.

## 2018-01-09 NOTE — Discharge Summary (Signed)
Physician Discharge Summary  Nicole Key ZOX:[id number] DOB: 05/01/1928 DOA: 12/31/2017  PCP: Geoffry Paradise, MD  Admit date: 12/31/2017 Discharge date: 01/09/2018  Time spent: 35 minutes  Recommendations for Outpatient Follow-up:  [facility]       Discharge Diagnoses:  Principal Problem:   Sepsis (HCC) Active Problems:   Acute respiratory failure with hypoxia (HCC)   Aspiration pneumonia (HCC)   Hiatal hernia with GERD   Acute hypokalemia   Fibromyalgia   Arthritis   Protein calorie malnutrition (HCC)   Arthralgia of both lower legs   Discharge Condition: Guarded  Diet recommendation: Dysphasia 2 fluid consistency thin  Filed Weights   01/02/18 0500 01/03/18 0423 01/04/18 0500  Weight: 104 lb 11.5 oz (47.5 kg) 107 lb 12.9 oz (48.9 kg) 114 lb 10.2 oz (52 kg)    History of present illness:  84 y.o. female w/ a hx of a large hiatal hernia, incarcerated hiatal hernia with obstruction, aspiration pneumonia, HTN, fibromyalgia, anxiety, GERD, malnutrition, and gi bleeding who was admitted 12/31/17 with aspiration pneumonia and sepsis. Patient had been diagnosed with a UTI and put on Keflex but developed emesis.  While in the ED she suffered an apneic event associated with aspiration during a bout of emesis.      Hospital Course:  Sepsis due to aspiration pneumonia / pneumonitis  -Completed 7-day course of antibiotics however no significant improvement.  Patient and family decided to transition to home hospice. -Discharge on [facility] [place]  Acute respiratory failure with hypoxia secondary to aspiration pneumonia  -Resolved -Discharge on [facility] [place]   Large Hiatal Hernia/GERD/Recurrent aspiration - Continue PPI+ Carafate -Not candidate for definitive treatment -Discharge on [facility] [place]  Anemia -Hg stable - Negative signs or symptoms of acute bleed     Hypomagnesemia / Hypokalemia / Hypophosphatemia -Due to poor nutritional state - corrected w/ supplementation   Essential HTN -BP is at goal no indication for tight blood pressure control given patient's age and frailty.     Fibromyalgia - Pain appears controlled however would be very careful with pain medication given patient's age, and frail status.    Severe malnutrition - Context of chronic illness. -Discharge on [facility] [place]  Anxiety -Patient on poor choice for anxiety medication i.e. Xanax however will not change at this time.  Will allow hospice and palliative care [place] to manage.   Consultations: Palliative care    Antibiotics Anti-infectives (From admission, onward)   Start     Stop   01/04/18 1115  amoxicillin-clavulanate (AUGMENTIN) 875-125 MG per tablet 1 tablet  Status:  Discontinued     01/04/18 1109   01/04/18 1115  amoxicillin-clavulanate (AUGMENTIN) 500-125 MG per tablet 500 mg     01/07/18 2152   01/02/18 0900  vancomycin (VANCOCIN) IVPB 750 mg/150 ml premix  Status:  Discontinued     01/04/18 1107   01/01/18 1600  piperacillin-tazobactam (ZOSYN) IVPB 3.375 g  Status:  Discontinued     01/04/18 1107   01/01/18 0830  piperacillin-tazobactam (ZOSYN) IVPB 3.375 g     01/01/18 0905   01/01/18 0830  vancomycin (VANCOCIN) IVPB 1000 mg/200 mL premix     01/01/18 1006       Discharge Exam: Vitals:   01/08/18 0803 01/08/18 1618 01/09/18 0009 01/09/18 0754  BP: (!) 141/63 (!) 153/82 139/67 137/78  Pulse: 85 97 92 91  Resp: (!) 23  20  16  Temp: 98 F (36.7 C) 98.2 F (36.8 C) 98.1 F (36.7 C) 98.6 F (37 C)  TempSrc: Oral Oral Oral Oral  SpO2: 92% 96% 93% 94%  Weight:      Height:        General: A/O x4, lethargic, cachectic Cardiovascular: Regular rhythm and rate, negative murmurs rubs gallops, normal S1/S2 Respiratory: To auscultation bilateral negative wheeze, negative rhonchi   Discharge  Instructions   Allergies as of 01/09/2018      Reactions   Fish-derived Products Hives   Shellfish Allergy Hives      Medication List    STOP taking these medications   diltiazem 30 MG tablet Commonly known as:  CARDIZEM   HYDROcodone-acetaminophen 5-325 MG tablet Commonly known as:  NORCO/VICODIN   metoprolol tartrate 25 MG tablet Commonly known as:  LOPRESSOR   traZODone 50 MG tablet Commonly known as:  DESYREL   VITAMIN B 12 PO     TAKE these medications   acetaminophen 325 MG tablet Commonly known as:  TYLENOL Take 1 tablet (325 mg total) by mouth every 4 (four) hours as needed for mild pain, fever or headache. What changed:    how much to take  when to take this  reasons to take this   ALPRAZolam 0.25 MG tablet Commonly known as:  XANAX Take 1-2 tablets (0.25-0.5 mg total) by mouth 3 (three) times daily as needed for anxiety. What changed:    how much to take  when to take this  reasons to take this   ASPERCREME LIDOCAINE EX Apply 1 application topically 3 (three) times daily.   feeding supplement (ENSURE ENLIVE) Liqd Take 237 mLs by mouth 2 (two) times daily between meals.   fentaNYL 25 MCG/HR patch Commonly known as:  DURAGESIC - dosed mcg/hr Place 1 patch (25 mcg total) onto the skin every 3 (three) days. Start taking on:  01/11/2018   hyoscyamine 0.125 MG tablet Commonly known as:  LEVSIN, ANASPAZ Take 0.125 mg by mouth every 4 (four) hours as needed for bladder spasms or cramping.   isosorbide mononitrate 30 MG 24 hr tablet Commonly known as:  IMDUR Take 0.5 tablets (15 mg total) by mouth daily. Start taking on:  01/10/2018   meclizine 25 MG tablet Commonly known as:  ANTIVERT Take 25 mg by mouth 3 (three) times daily as needed for dizziness.   oxyCODONE 5 MG immediate release tablet Commonly known as:  Oxy IR/ROXICODONE Take 1-2 tablets (5-10 mg total) by mouth every 3 (three) hours as needed for moderate pain.   pantoprazole 40 MG  tablet Commonly known as:  PROTONIX Take 1 tablet (40 mg total) by mouth 2 (two) times daily before a meal.   promethazine 25 MG tablet Commonly known as:  PHENERGAN Take 25 mg by mouth every 8 (eight) hours as needed for nausea/vomiting.   sucralfate 1 g tablet Commonly known as:  CARAFATE Take 1 tablet (1 g total) by mouth 4 (four) times daily -  before meals and at bedtime. What changed:  when to take this      Allergies  Allergen Reactions  . Fish-Derived Products Hives  . Shellfish Allergy Hives   Follow-up Information    [facility] [place] Follow up.   Why:  home hospice Contact information: [address] [facility] [address] [phone]           The results of significant diagnostics from this hospitalization (including imaging, microbiology,  ancillary and laboratory) are listed below for reference.    Significant Diagnostic Studies: Ct Chest W Contrast  Result Date: 01/01/2018 CLINICAL DATA:  Nausea, vomiting, and diffuse abdominal pain. Recent unexplained weight loss. EXAM: CT CHEST, ABDOMEN, AND PELVIS WITH CONTRAST TECHNIQUE: Multidetector CT imaging of the chest, abdomen and pelvis was performed following the standard protocol during bolus administration of intravenous contrast. CONTRAST:  75mL OMNIPAQUE IOHEXOL 300 MG/ML  SOLN COMPARISON:  CT of the chest September 07, 2017 and CT the abdomen and pelvis September 01, 2017 FINDINGS: CT CHEST FINDINGS Cardiovascular: Atherosclerotic changes are seen in the nonaneurysmal aorta. No dissection. Central pulmonary arteries are normal in caliber. No obvious filling defects in the visualized pulmonary arteries. The heart is unchanged. The heart is displaced anteriorly by the hiatal hernia. Mediastinum/Nodes: The thyroid is unremarkable. There is a large hiatal hernia containing at least half of the stomach. The hiatal hernia contains fluid and is unchanged in size in the interval. No wall  thickening. The remainder of the esophagus is unremarkable. No pleural effusion. There is a small pericardial effusion. A 10 mm precarinal node is stable. A mildly prominent subcarinal node appears stable as well. No new adenopathy identified in the chest. Lungs/Pleura: The trachea and mainstem bronchi are normal. Upper lobe airways are normal. There is opacification of lower lobe bronchi filled with fluid and debris. Bibasilar pulmonary opacities are identified, greater on the left than the right. The lingula and right middle lobe are also involved. No pneumothorax. No pulmonary nodules or masses. Musculoskeletal: See below. CT ABDOMEN PELVIS FINDINGS Hepatobiliary: Probable tiny cysts scattered throughout the liver, too small to characterize. Hepatic steatosis. The portal vein is patent. The sludge seen in the gallbladder was better appreciated previously but likely remains. No wall thickening or pericholecystic fluid. Pancreas: Unremarkable. No pancreatic ductal dilatation or surrounding inflammatory changes. Spleen: Normal in size without focal abnormality. Adrenals/Urinary Tract: Adrenal glands are normal. Probable small renal cysts, too small to characterize. No suspicious masses. No hydronephrosis or perinephric stranding. No ureterectasis or ureteral stones. The bladder is normal. The air in the wall of bladder previously has resolved. Stomach/Bowel: There is a large hiatal hernia with over half of the stomach above the diaphragm. No gastric wall thickening. There is a large complicated duodenal diverticulum which is stable. The remainder of the small bowel is normal. Colonic diverticulosis is seen without diverticulitis. The previously identified colitis has resolved. There is mild fecal loading in the cecum. The appendix is not visualized consistent with history of previous appendectomy. Vascular/Lymphatic: Atherosclerotic changes are identified in the abdominal aorta. 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 This appears to be stable since in June of 2016. The aorta is tortuous but nonaneurysmal. Atherosclerotic changes extend into the iliac and femoral vessels. The venous structures are not well assessed due to timing of contrast. No adenopathy. Reproductive: Status post hysterectomy. No adnexal masses. Other: No free air. Haziness in the left abdomen as seen on axial images 69 through 72 is volume averaging off of a slip of peritoneum, which is unchanged based on coronal imaging. No free fluid. Musculoskeletal: There is a compression fracture of L3 which is unchanged since September 01, 2017. There is a mild compression fracture of T10 which is stable since September 07, 2017. No acute bony abnormalities are identified. A healed sternal fracture is noted. IMPRESSION: 1. Bibasilar infiltrates, left greater than right could represent pneumonia or aspiration. Aspiration is favored due to  fluid and debris in the lower lobe bronchi. Recommend clinical correlation and follow-up to resolution. There is also mild involvement of the right middle lobe and lingula. 2. Large hiatal hernia, unchanged. 3. Atherosclerotic changes throughout the aorta. 4. 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 5. Mildly prominent nodes in the mediastinum are likely reactive. They are stable. 6. Gallbladder sludge or stones, better seen on previous imaging. 7. Diverticulosis without diverticulitis. 8. Chronic compression fractures of T10 and L3. Aortic Atherosclerosis (ICD10-I70.0). Electronically Signed   By: Gerome Sam III M.D   On: 01/01/2018 12:16   Ct Abdomen Pelvis W Contrast  Result Date: 01/01/2018 CLINICAL DATA:  Nausea, vomiting, and diffuse abdominal pain. Recent unexplained weight loss. EXAM: CT CHEST, ABDOMEN, AND PELVIS WITH CONTRAST TECHNIQUE: Multidetector CT imaging of the chest, abdomen and pelvis was performed following the standard  protocol during bolus administration of intravenous contrast. CONTRAST:  75mL OMNIPAQUE IOHEXOL 300 MG/ML  SOLN COMPARISON:  CT of the chest September 07, 2017 and CT the abdomen and pelvis September 01, 2017 FINDINGS: CT CHEST FINDINGS Cardiovascular: Atherosclerotic changes are seen in the nonaneurysmal aorta. No dissection. Central pulmonary arteries are normal in caliber. No obvious filling defects in the visualized pulmonary arteries. The heart is unchanged. The heart is displaced anteriorly by the hiatal hernia. Mediastinum/Nodes: The thyroid is unremarkable. There is a large hiatal hernia containing at least half of the stomach. The hiatal hernia contains fluid and is unchanged in size in the interval. No wall thickening. The remainder of the esophagus is unremarkable. No pleural effusion. There is a small pericardial effusion. A 10 mm precarinal node is stable. A mildly prominent subcarinal node appears stable as well. No new adenopathy identified in the chest. Lungs/Pleura: The trachea and mainstem bronchi are normal. Upper lobe airways are normal. There is opacification of lower lobe bronchi filled with fluid and debris. Bibasilar pulmonary opacities are identified, greater on the left than the right. The lingula and right middle lobe are also involved. No pneumothorax. No pulmonary nodules or masses. Musculoskeletal: See below. CT ABDOMEN PELVIS FINDINGS Hepatobiliary: Probable tiny cysts scattered throughout the liver, too small to characterize. Hepatic steatosis. The portal vein is patent. The sludge seen in the gallbladder was better appreciated previously but likely remains. No wall thickening or pericholecystic fluid. Pancreas: Unremarkable. No pancreatic ductal dilatation or surrounding inflammatory changes. Spleen: Normal in size without focal abnormality. Adrenals/Urinary Tract: Adrenal glands are normal. Probable small renal cysts, too small to characterize. No suspicious masses. No hydronephrosis or  perinephric stranding. No ureterectasis or ureteral stones. The bladder is normal. The air in the wall of bladder previously has resolved. Stomach/Bowel: There is a large hiatal hernia with over half of the stomach above the diaphragm. No gastric wall thickening. There is a large complicated duodenal diverticulum which is stable. The remainder of the small bowel is normal. Colonic diverticulosis is seen without diverticulitis. The previously identified colitis has resolved. There is mild fecal loading in the cecum. The appendix is not visualized consistent with history of previous appendectomy. Vascular/Lymphatic: Atherosclerotic changes are identified in the abdominal aorta. 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 This appears to be stable since in June of 2016. The aorta is tortuous but nonaneurysmal. Atherosclerotic changes extend into the iliac and femoral vessels. The venous structures are not well assessed due to timing of contrast. No adenopathy. Reproductive: Status post hysterectomy. No adnexal masses. Other: No free  air. Haziness in the left abdomen as seen on axial images 69 through 72 is volume averaging off of a slip of peritoneum, which is unchanged based on coronal imaging. No free fluid. Musculoskeletal: There is a compression fracture of L3 which is unchanged since September 01, 2017. There is a mild compression fracture of T10 which is stable since September 07, 2017. No acute bony abnormalities are identified. A healed sternal fracture is noted. IMPRESSION: 1. Bibasilar infiltrates, left greater than right could represent pneumonia or aspiration. Aspiration is favored due to fluid and debris in the lower lobe bronchi. Recommend clinical correlation and follow-up to resolution. There is also mild involvement of the right middle lobe and lingula. 2. Large hiatal hernia, unchanged. 3. Atherosclerotic changes throughout the aorta. 4. Severe narrowing of  the proximal celiac artery, similar since June of 2016. 5. Mildly prominent nodes in the mediastinum are likely reactive. They are stable. 6. Gallbladder sludge or stones, better seen on previous imaging. 7. Diverticulosis without diverticulitis. 8. Chronic compression fractures of T10 and L3. Aortic Atherosclerosis (ICD10-I70.0). Electronically Signed   By: Gerome Sam III M.D   On: 01/01/2018 12:16   Dg Chest Port 1 View  Result Date: 01/01/2018 CLINICAL DATA:  Vomiting since yesterday EXAM: PORTABLE CHEST 1 VIEW COMPARISON:  None. FINDINGS: Stable enlarged cardiac silhouette stable cardiac silhouette. Large gas-filled hiatal hernia. No effusion, infiltrate pneumothorax. Atherosclerotic calcification of the aorta. IMPRESSION: 1.  No acute cardiopulmonary process. 2. Large gas-filled hiatal hernia Electronically Signed   By: Genevive Bi M.D.   On: 01/01/2018 07:49    Microbiology: Recent Results (from the past 240 hour(s))  Culture, blood (routine x 2)     Status: None   Collection Time: 01/01/18  6:58 AM  Result Value Ref Range Status   Specimen Description BLOOD RIGHT ANTECUBITAL  Final   Special Requests   Final    BOTTLES DRAWN AEROBIC AND ANAEROBIC Blood Culture adequate volume   Culture   Final    NO GROWTH 5 DAYS Performed at [facility], [address] [place], [place] [address]    Report Status 01/06/2018 FINAL  Final  Culture, blood (routine x 2)     Status: None   Collection Time: 01/01/18  7:03 AM  Result Value Ref Range Status   Specimen Description BLOOD LEFT ANTECUBITAL  Final   Special Requests   Final    BOTTLES DRAWN AEROBIC ONLY Blood Culture results may not be optimal due to an inadequate volume of blood received in culture bottles   Culture   Final    NO GROWTH 5 DAYS Performed at [facility], [address] [place], [place] [address]    Report Status 01/06/2018 FINAL  Final  MRSA PCR Screening     Status: None   Collection Time: 01/01/18   3:31 PM  Result Value Ref Range Status   MRSA by PCR NEGATIVE NEGATIVE Final    Comment:        The GeneXpert MRSA Assay (FDA approved for NASAL specimens only), is one component of a comprehensive MRSA colonization surveillance program. It is not intended to diagnose MRSA infection nor to guide or monitor treatment for MRSA infections. Performed at [facility], [address] [place], [place] [address]   Culture, Urine     Status: None   Collection Time: 01/01/18  5:07 PM  Result Value Ref Range Status   Specimen Description URINE, CATHETERIZED  Final  Special Requests NONE  Final   Culture   Final    NO GROWTH Performed at [facility], [address], [place], [place] [address]    Report Status 01/02/2018 FINAL  Final     Labs: Basic Metabolic Panel: No results for input(s): NA, K, CL, CO2, GLUCOSE, BUN, CREATININE, CALCIUM, MG, PHOS in the last 168 hours. Liver Function Tests: No results for input(s): AST, ALT, ALKPHOS, BILITOT, PROT, ALBUMIN in the last 168 hours. No results for input(s): LIPASE, AMYLASE in the last 168 hours. No results for input(s): AMMONIA in the last 168 hours. CBC: Recent Labs  Lab 01/03/18 0204 01/04/18 0325  WBC 21.8* 18.4*  HGB 11.5* 9.9*  HCT 35.3* 31.1*  MCV 89.4 87.6  PLT 240 281   Cardiac Enzymes: No results for input(s): CKTOTAL, CKMB, CKMBINDEX, TROPONINI in the last 168 hours. BNP: BNP (last 3 results) Recent Labs    09/01/17 1807 09/09/17 1119  BNP 275.0* 176.7*    ProBNP (last 3 results) No results for input(s): PROBNP in the last 8760 hours.  CBG: No results for input(s): GLUCAP in the last 168 hours.     Signed:  Carolyne Littles, MD [facility] [phone] pager

## 2018-03-28 DEATH — deceased

## 2019-12-10 IMAGING — CT CT CHEST W/ CM
2 of 5 series · 11 of 36 positions shown, 13 images · IV contrast (Omni 300)
Comparison: CT of the chest September 07, 2017 and CT the abdomen and
pelvis September 01, 2017

CLINICAL DATA: Nausea, vomiting, and diffuse abdominal pain. Recent
unexplained weight loss.

EXAM:
CT CHEST, ABDOMEN, AND PELVIS WITH CONTRAST
TECHNIQUE: Multidetector CT imaging of the chest, abdomen and pelvis was
performed following the standard protocol during bolus
administration of intravenous contrast.
CONTRAST:  75mL OMNIPAQUE IOHEXOL 300 MG/ML  SOLN

[Series 3: cap with 5mm st · axial · 0.65mm/px · z∈[+978,+1468]mm · 8 of 120 slices shown, 10 images]
[im 11/120  mediastinal]
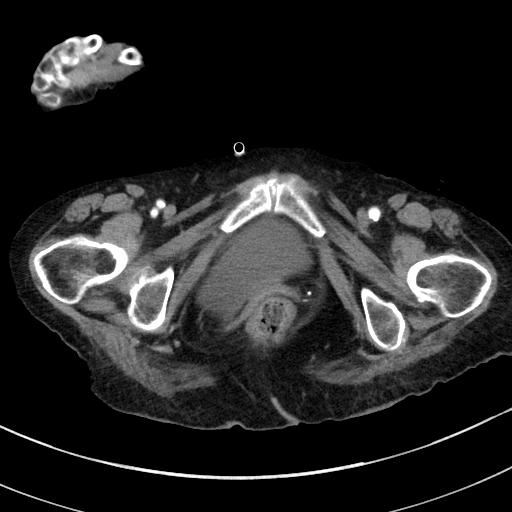
[im 11/120  lung]
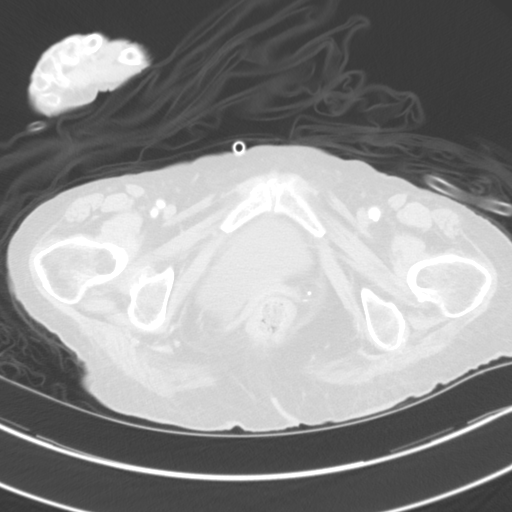
[im 22/120  lung]
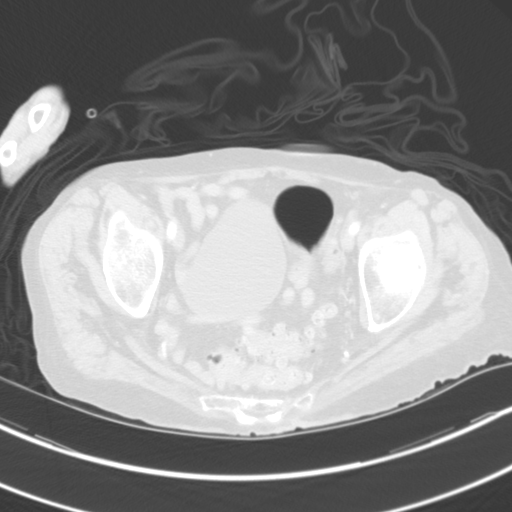
[im 44/120  lung]
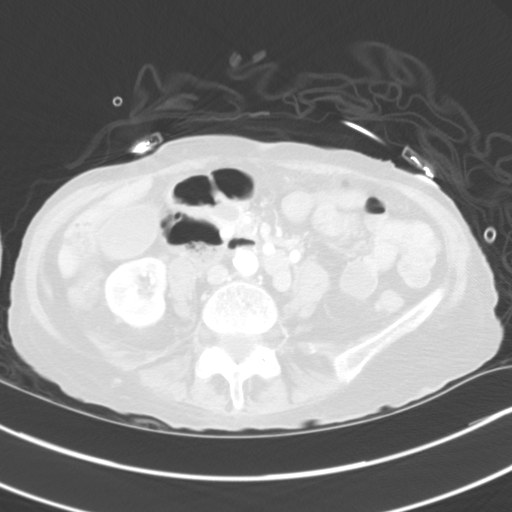
[im 55/120  lung]
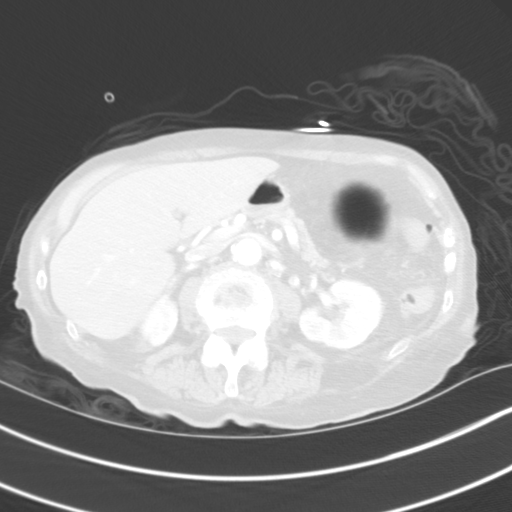
[im 65/120  mediastinal]
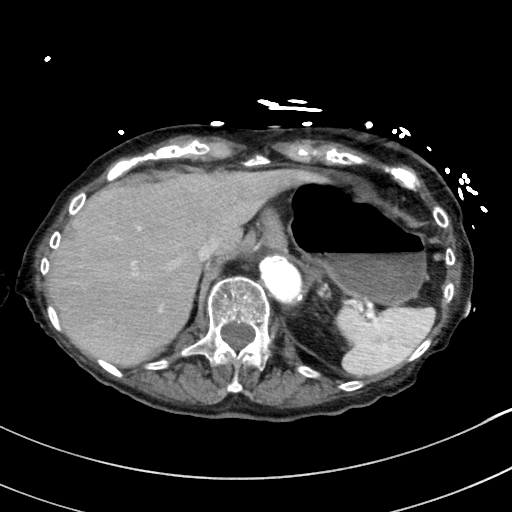
[im 65/120  lung]
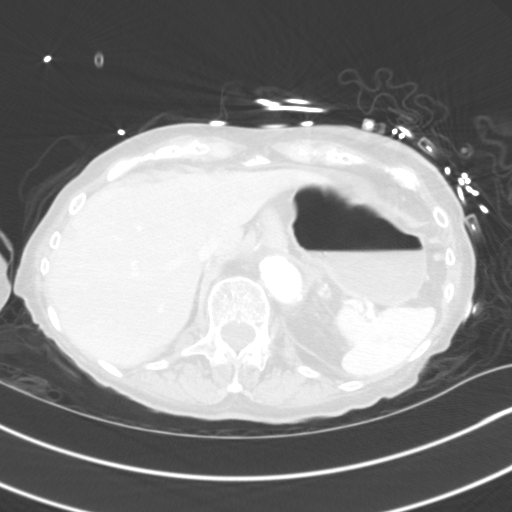
[im 76/120  lung]
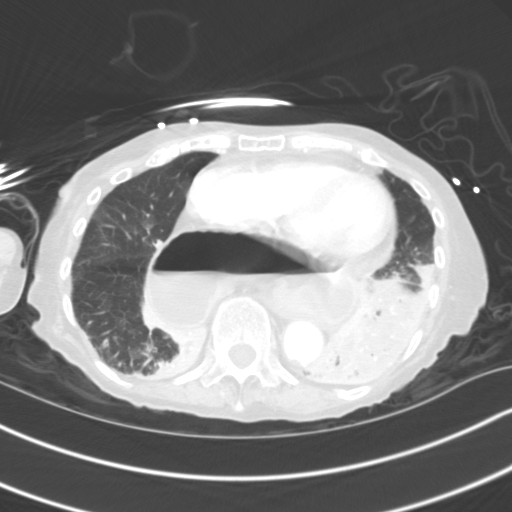
[im 98/120  lung]
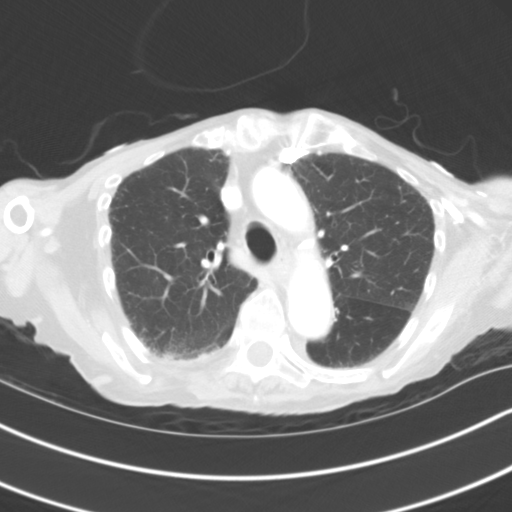
[im 109/120  lung]
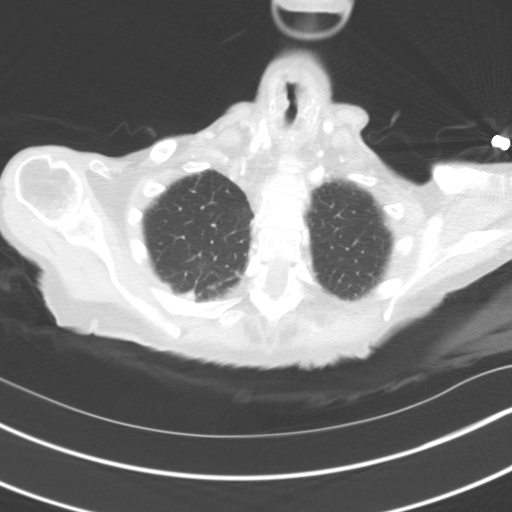

[Series 5: cap with 3mm st cor · coronal · 0.64mm/px · 3 of 144 slices shown]
[im 29/144  lung]
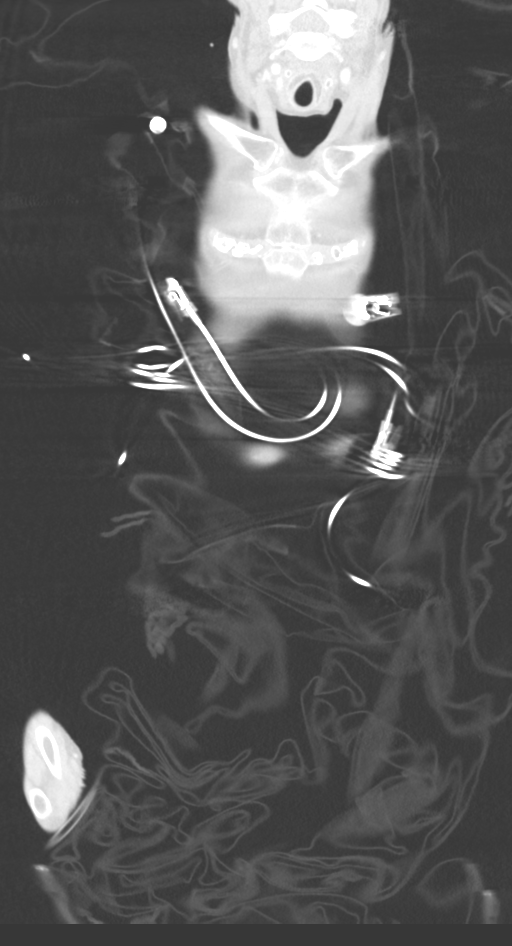
[im 58/144  lung]
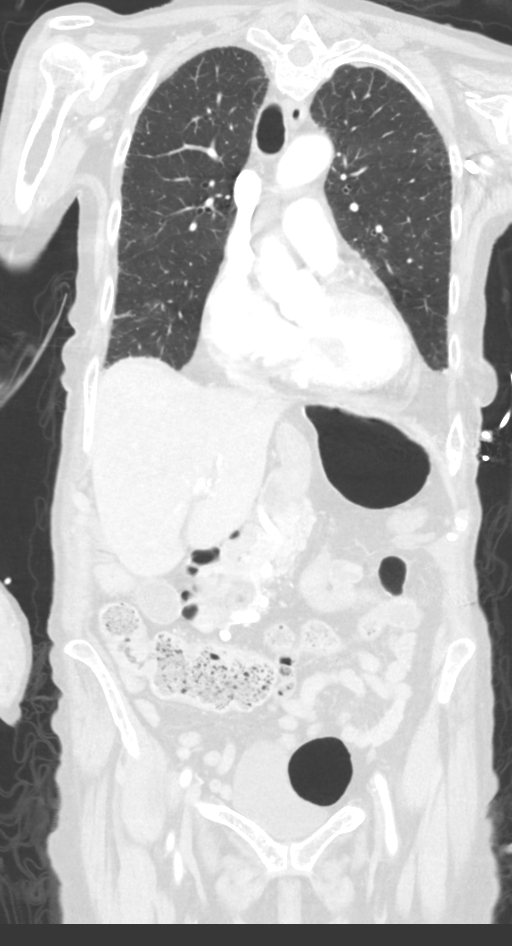
[im 86/144  lung]
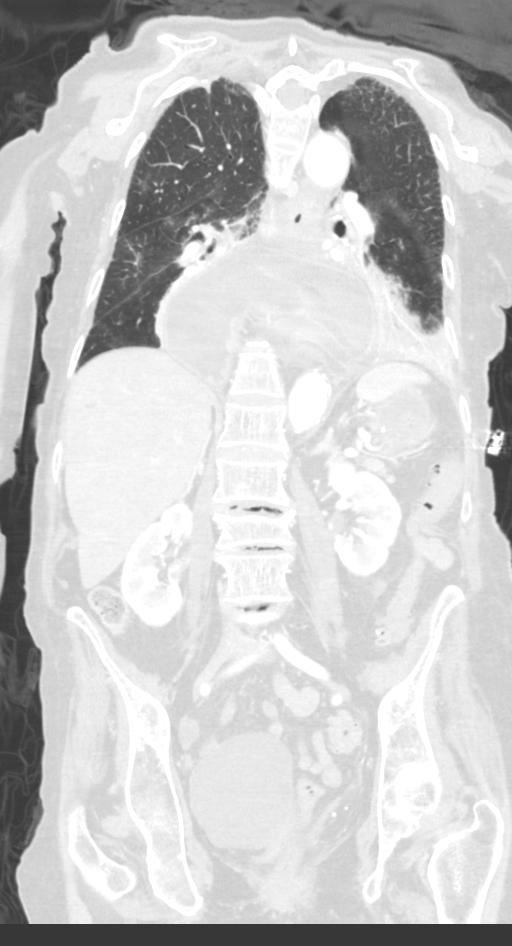

[11 of 36 positions shown; findings below may reference images not displayed]

FINDINGS: CT CHEST FINDINGS

Cardiovascular: Atherosclerotic changes are seen in the
nonaneurysmal aorta. No dissection. Central pulmonary arteries are
normal in caliber. No obvious filling defects in the visualized
pulmonary arteries. The heart is unchanged. The heart is displaced
anteriorly by the hiatal hernia.

Mediastinum/Nodes: The thyroid is unremarkable. There is a large
hiatal hernia containing at least half of the stomach. The hiatal
hernia contains fluid and is unchanged in size in the interval. No
wall thickening. The remainder of the esophagus is unremarkable. No
pleural effusion. There is a small pericardial effusion. A 10 mm
precarinal node is stable. A mildly prominent subcarinal node
appears stable as well. No new adenopathy identified in the chest.

Lungs/Pleura: The trachea and mainstem bronchi are normal. Upper
lobe airways are normal. There is opacification of lower lobe
bronchi filled with fluid and debris. Bibasilar pulmonary opacities
are identified, greater on the left than the right. The lingula and
right middle lobe are also involved. No pneumothorax. No pulmonary
nodules or masses.

Musculoskeletal: See below.

CT ABDOMEN PELVIS FINDINGS

Hepatobiliary: Probable tiny cysts scattered throughout the liver,
too small to characterize. Hepatic steatosis. The portal vein is
patent. The sludge seen in the gallbladder was better appreciated
previously but likely remains. No wall thickening or pericholecystic
fluid.

Pancreas: Unremarkable. No pancreatic ductal dilatation or
surrounding inflammatory changes.

Spleen: Normal in size without focal abnormality.

Adrenals/Urinary Tract: Adrenal glands are normal. Probable small
renal cysts, too small to characterize. No suspicious masses. No
hydronephrosis or perinephric stranding. No ureterectasis or
ureteral stones. The bladder is normal. The air in the wall of
bladder previously has resolved.

Stomach/Bowel: There is a large hiatal hernia with over half of the
stomach above the diaphragm. No gastric wall thickening. There is a
large complicated duodenal diverticulum which is stable. The
remainder of the small bowel is normal. Colonic diverticulosis is
seen without diverticulitis. The previously identified colitis has
resolved. There is mild fecal loading in the cecum. The appendix is
not visualized consistent with history of previous appendectomy.

Vascular/Lymphatic: Atherosclerotic changes are identified in the
abdominal aorta. There is severe narrowing of the proximal celiac
artery with approximately 80-90% narrowing. This appears to be
stable since in Tuesday January, 2015. The aorta is tortuous but
nonaneurysmal. Atherosclerotic changes extend into the iliac and
femoral vessels. The venous structures are not well assessed due to
timing of contrast. No adenopathy.

Reproductive: Status post hysterectomy. No adnexal masses.

Other: No free air. Haziness in the left abdomen as seen on axial
images 69 through 72 is volume averaging off of a slip of
peritoneum, which is unchanged based on coronal imaging. No free
fluid.

Musculoskeletal: There is a compression fracture of L3 which is
unchanged since September 01, 2017. There is a mild compression
fracture of T10 which is stable since September 07, 2017. No acute
bony abnormalities are identified. A healed sternal fracture is
noted.
IMPRESSION: 1. Bibasilar infiltrates, left greater than right could represent
pneumonia or aspiration. Aspiration is favored due to fluid and
debris in the lower lobe bronchi. Recommend clinical correlation and
follow-up to resolution. There is also mild involvement of the right
middle lobe and lingula.
2. Large hiatal hernia, unchanged.
3. Atherosclerotic changes throughout the aorta.
4. Severe narrowing of the proximal celiac artery, similar since
Saturday January, 2015. Mildly prominent nodes in the mediastinum are likely reactive.
They are stable.
6. Gallbladder sludge or stones, better seen on previous imaging.
7. Diverticulosis without diverticulitis.
8. Chronic compression fractures of T10 and L3.

Aortic Atherosclerosis ([id number]).
